# Patient Record
Sex: Female | Born: 1970 | Race: White | Hispanic: No | Smoking: Current every day smoker
Health system: Southern US, Community
[De-identification: ages and names within clinical notes are randomized; demographics above are authoritative.]

## PROBLEM LIST (undated history)

## (undated) DIAGNOSIS — J302 Other seasonal allergic rhinitis: Secondary | ICD-10-CM

## (undated) DIAGNOSIS — M542 Cervicalgia: Secondary | ICD-10-CM

## (undated) DIAGNOSIS — A6000 Herpesviral infection of urogenital system, unspecified: Secondary | ICD-10-CM

## (undated) DIAGNOSIS — I8390 Asymptomatic varicose veins of unspecified lower extremity: Secondary | ICD-10-CM

## (undated) DIAGNOSIS — F32A Depression, unspecified: Secondary | ICD-10-CM

## (undated) DIAGNOSIS — L719 Rosacea, unspecified: Secondary | ICD-10-CM

## (undated) DIAGNOSIS — Z22322 Carrier or suspected carrier of Methicillin resistant Staphylococcus aureus: Secondary | ICD-10-CM

## (undated) DIAGNOSIS — N920 Excessive and frequent menstruation with regular cycle: Secondary | ICD-10-CM

## (undated) DIAGNOSIS — J449 Chronic obstructive pulmonary disease, unspecified: Secondary | ICD-10-CM

## (undated) DIAGNOSIS — F329 Major depressive disorder, single episode, unspecified: Secondary | ICD-10-CM

## (undated) DIAGNOSIS — K449 Diaphragmatic hernia without obstruction or gangrene: Secondary | ICD-10-CM

## (undated) DIAGNOSIS — M79604 Pain in right leg: Secondary | ICD-10-CM

## (undated) DIAGNOSIS — A048 Other specified bacterial intestinal infections: Secondary | ICD-10-CM

## (undated) HISTORY — DX: Other specified bacterial intestinal infections: A04.8

## (undated) HISTORY — DX: Cervicalgia: M54.2

## (undated) HISTORY — DX: Excessive and frequent menstruation with regular cycle: N92.0

## (undated) HISTORY — DX: Pain in right leg: M79.604

## (undated) HISTORY — PX: HIP SURGERY: SHX245

## (undated) HISTORY — DX: Asymptomatic varicose veins of unspecified lower extremity: I83.90

## (undated) HISTORY — DX: Carrier or suspected carrier of methicillin resistant Staphylococcus aureus: Z22.322

## (undated) HISTORY — DX: Diaphragmatic hernia without obstruction or gangrene: K44.9

## (undated) HISTORY — DX: Other seasonal allergic rhinitis: J30.2

## (undated) HISTORY — DX: Herpesviral infection of urogenital system, unspecified: A60.00

## (undated) HISTORY — DX: Rosacea, unspecified: L71.9

---
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
## 1997-06-21 HISTORY — PX: ANKLE SURGERY: SHX546

## 2001-04-27 ENCOUNTER — Encounter: Payer: Self-pay | Admitting: Family Medicine

## 2001-04-27 ENCOUNTER — Ambulatory Visit (HOSPITAL_COMMUNITY): Admission: RE | Admit: 2001-04-27 | Discharge: 2001-04-27 | Payer: Self-pay | Admitting: Family Medicine

## 2002-04-27 ENCOUNTER — Ambulatory Visit (HOSPITAL_COMMUNITY): Admission: RE | Admit: 2002-04-27 | Discharge: 2002-04-27 | Payer: Self-pay | Admitting: Family Medicine

## 2002-04-27 ENCOUNTER — Encounter: Payer: Self-pay | Admitting: Family Medicine

## 2003-12-12 ENCOUNTER — Ambulatory Visit (HOSPITAL_COMMUNITY): Admission: RE | Admit: 2003-12-12 | Discharge: 2003-12-12 | Payer: Self-pay | Admitting: Family Medicine

## 2004-11-27 ENCOUNTER — Ambulatory Visit (HOSPITAL_COMMUNITY): Admission: RE | Admit: 2004-11-27 | Discharge: 2004-11-27 | Payer: Self-pay | Admitting: Family Medicine

## 2005-03-16 ENCOUNTER — Ambulatory Visit: Payer: Self-pay | Admitting: Internal Medicine

## 2007-05-01 ENCOUNTER — Other Ambulatory Visit: Admission: RE | Admit: 2007-05-01 | Discharge: 2007-05-01 | Payer: Self-pay | Admitting: Obstetrics and Gynecology

## 2007-05-24 ENCOUNTER — Ambulatory Visit (HOSPITAL_COMMUNITY): Admission: RE | Admit: 2007-05-24 | Discharge: 2007-05-24 | Payer: Self-pay | Admitting: Obstetrics & Gynecology

## 2007-09-15 ENCOUNTER — Ambulatory Visit (HOSPITAL_COMMUNITY): Admission: RE | Admit: 2007-09-15 | Discharge: 2007-09-15 | Payer: Self-pay | Admitting: Obstetrics and Gynecology

## 2008-01-12 ENCOUNTER — Ambulatory Visit (HOSPITAL_COMMUNITY): Admission: RE | Admit: 2008-01-12 | Discharge: 2008-01-12 | Payer: Self-pay | Admitting: Family Medicine

## 2008-01-14 ENCOUNTER — Emergency Department (HOSPITAL_COMMUNITY): Admission: EM | Admit: 2008-01-14 | Discharge: 2008-01-14 | Payer: Self-pay | Admitting: Emergency Medicine

## 2008-05-01 ENCOUNTER — Other Ambulatory Visit: Admission: RE | Admit: 2008-05-01 | Discharge: 2008-05-01 | Payer: Self-pay | Admitting: Obstetrics and Gynecology

## 2008-06-21 HISTORY — PX: FIBULA FRACTURE SURGERY: SHX947

## 2008-06-24 ENCOUNTER — Inpatient Hospital Stay (HOSPITAL_COMMUNITY): Admission: RE | Admit: 2008-06-24 | Discharge: 2008-06-25 | Payer: Self-pay | Admitting: Family Medicine

## 2009-06-21 HISTORY — PX: UTERINE FIBROID SURGERY: SHX826

## 2009-06-21 HISTORY — PX: UTERINE SEPTUM RESECTION: SHX5386

## 2009-08-08 ENCOUNTER — Ambulatory Visit (HOSPITAL_COMMUNITY): Admission: RE | Admit: 2009-08-08 | Discharge: 2009-08-08 | Payer: Self-pay | Admitting: Obstetrics & Gynecology

## 2009-11-24 ENCOUNTER — Ambulatory Visit (HOSPITAL_COMMUNITY): Admission: RE | Admit: 2009-11-24 | Discharge: 2009-11-24 | Payer: Self-pay | Admitting: Family Medicine

## 2010-07-11 ENCOUNTER — Encounter: Payer: Self-pay | Admitting: Family Medicine

## 2010-07-12 ENCOUNTER — Encounter: Payer: Self-pay | Admitting: Obstetrics & Gynecology

## 2010-07-20 ENCOUNTER — Other Ambulatory Visit (HOSPITAL_COMMUNITY): Payer: Self-pay | Admitting: Family Medicine

## 2010-07-20 DIAGNOSIS — R102 Pelvic and perineal pain: Secondary | ICD-10-CM

## 2010-07-20 DIAGNOSIS — N838 Other noninflammatory disorders of ovary, fallopian tube and broad ligament: Secondary | ICD-10-CM

## 2010-07-24 ENCOUNTER — Other Ambulatory Visit (HOSPITAL_COMMUNITY): Payer: Self-pay

## 2010-07-28 ENCOUNTER — Ambulatory Visit (HOSPITAL_COMMUNITY)
Admission: RE | Admit: 2010-07-28 | Discharge: 2010-07-28 | Disposition: A | Discharge status: Home / Self Care | Payer: Medicare Other | Source: Ambulatory Visit | Attending: Family Medicine | Admitting: Family Medicine

## 2010-07-28 DIAGNOSIS — N838 Other noninflammatory disorders of ovary, fallopian tube and broad ligament: Secondary | ICD-10-CM

## 2010-07-28 DIAGNOSIS — R1031 Right lower quadrant pain: Secondary | ICD-10-CM | POA: Insufficient documentation

## 2010-07-28 DIAGNOSIS — R9389 Abnormal findings on diagnostic imaging of other specified body structures: Secondary | ICD-10-CM | POA: Insufficient documentation

## 2010-07-28 DIAGNOSIS — R1032 Left lower quadrant pain: Secondary | ICD-10-CM | POA: Insufficient documentation

## 2010-07-28 DIAGNOSIS — R102 Pelvic and perineal pain unspecified side: Secondary | ICD-10-CM

## 2010-10-05 LAB — DIFFERENTIAL
Basophils Absolute: 0.1 10*3/uL (ref 0.0–0.1)
Basophils Relative: 1 % (ref 0–1)
Eosinophils Absolute: 0.2 10*3/uL (ref 0.0–0.7)
Monocytes Absolute: 0.9 10*3/uL (ref 0.1–1.0)
Monocytes Relative: 12 % (ref 3–12)
Neutro Abs: 4.7 10*3/uL (ref 1.7–7.7)

## 2010-10-05 LAB — CBC
HCT: 39 % (ref 36.0–46.0)
Hemoglobin: 13 g/dL (ref 12.0–15.0)
RBC: 4.05 MIL/uL (ref 3.87–5.11)

## 2010-10-05 LAB — BASIC METABOLIC PANEL
BUN: 10 mg/dL (ref 6–23)
CO2: 26 mEq/L (ref 19–32)
Chloride: 109 mEq/L (ref 96–112)
Creatinine, Ser: 0.71 mg/dL (ref 0.4–1.2)
GFR calc Af Amer: >60 mL/min (ref >60)
Potassium: 3.4 mEq/L — ABNORMAL LOW (ref 3.5–5.1)
Sodium: 137 mEq/L (ref 135–145)
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
## 2011-01-06 ENCOUNTER — Other Ambulatory Visit: Payer: Self-pay | Admitting: Family Medicine

## 2011-01-06 LAB — HEPATIC FUNCTION PANEL
ALT: 17 U/L (ref 7–35)
Albumin: 4.1
Alkaline Phosphatase: 55 U/L
Total Protein: 6.8 g/dL

## 2011-01-12 ENCOUNTER — Encounter (HOSPITAL_COMMUNITY): Payer: Medicare Other

## 2011-01-27 ENCOUNTER — Other Ambulatory Visit: Payer: Self-pay | Admitting: Cardiology

## 2011-01-28 ENCOUNTER — Encounter (HOSPITAL_COMMUNITY)
Admission: RE | Admit: 2011-01-28 | Discharge: 2011-01-28 | Disposition: A | Discharge status: Home / Self Care | Payer: Medicare Other | Source: Ambulatory Visit | Attending: Family Medicine | Admitting: Family Medicine

## 2011-01-28 DIAGNOSIS — R932 Abnormal findings on diagnostic imaging of liver and biliary tract: Secondary | ICD-10-CM | POA: Insufficient documentation

## 2011-01-28 DIAGNOSIS — R109 Unspecified abdominal pain: Secondary | ICD-10-CM | POA: Insufficient documentation

## 2011-01-28 MED ORDER — TECHNETIUM TC 99M MEBROFENIN IV KIT
5.0000 | PACK | Freq: Once | INTRAVENOUS | Status: AC | PRN
  Administered 2011-01-28: 5.1 via INTRAVENOUS

## 2011-03-19 LAB — PREGNANCY, URINE: Preg Test, Ur: NEGATIVE

## 2011-03-19 LAB — CBC
Hemoglobin: 13.2
MCV: 95.8
Platelets: 185
WBC: 12.3 — ABNORMAL HIGH

## 2011-03-19 LAB — URINALYSIS, ROUTINE W REFLEX MICROSCOPIC: Nitrite: NEGATIVE

## 2011-03-19 LAB — BASIC METABOLIC PANEL
Calcium: 9.5
Chloride: 101
Glucose, Bld: 98
Potassium: 3.3 — ABNORMAL LOW

## 2011-03-19 LAB — URINE MICROSCOPIC-ADD ON

## 2011-03-19 LAB — DIFFERENTIAL
Basophils Absolute: 0.1
Eosinophils Absolute: 0.1
Eosinophils Relative: 1
Neutrophils Relative %: 69

## 2011-04-02 NOTE — H&P (Signed)
NTS SOAP Note  Vital Signs:  Vitals as of: 02/09/2011: Systolic 114: Diastolic 74: Heart Rate 54: Temp 97.11F: Height 23ft 4in: Weight 137Lbs 0 Ounces: Pain Level 7: BMI 24  BMI : 23.52 kg/m2  Subjective: This 89 Years 59 Months old Female presents for of Abdominal pain. Patient states she's been having issues with abdominal pain for a long time. Patient can't quantify the exact amount of time but states at least a year. Pain is intermittent. It is epigastric and occasionally right upper quadrant. There is no additional radiation. She states hurts to lie on that area. No other exacerbating factors. She's not noted any changes with oral intake. She has not noted any changes with fatty greasy food intake. She states the pain as sharp. Other than that she has difficulties discussion describing the pain. Pain often resolves on its own. She does have associated nausea. Occasional nonbloody emesis. She does complain of diarrhea. No melena no hematochezia. She denies any fevers or chills. She denies any history of jaundice. She does complain of significant vaginal bleeding. This is been going on for the last month on a nearly daily basis. She states that she has discussed this with her primary care physician and she has seen a gynecologist several weeks ago. She's not certain what the plans are regarding the bleeding issue.  Review of Symptoms:  Constitutional: unremarkable Headaches, tired Head: unremarkable  Eyes:unremarkable  Nose/Mouth/Throat:unremarkable  Cardiovascular:unremarkable  Respiratory: unremarkable  As per history of present illness As per history of present illness Diffuse arthralgias Skin: unremarkable  Breast: unremarkable  Hematolgic/Lymphatic: unremarkable  Allergic/Immunologic:unremarkable     Past Medical History:Obtained  Past Medical History  Pregnancy Gravida: 2 Pregnancy Para: 0 Surgical History: none Medical Problems: abnormal vaginal bleeding Allergies: no  known drug allergies   Social History:Obtained   Social History  Preferred Language: English ([place]) Race: White Ethnicity: Not Hispanic / Latino Age: 40 Years 4 Months Marital Status: L Alcohol: No Recreational drug(s): No   Smoking Status: Current every day smoker reviewed on 02/10/2011 Started Date: 06/22/1983 Packs per day: 1.00   Family History: Obtained   Family History  Is there a family history of:non-contributory   Medication Allergies:   Allergies Insert Code:   Objective Information:  General: Well appearing, well nourished in no distress. Thin, slightly anxious and slightly disheveled.  Skin:no rash or prominent lesions  Head: Atraumatic; no masses; no abnormalities  Eyes: conjunctiva clear, EOM intact, PERRL  Mouth: Mucous membranes moist, no mucosal lesions.  Neck: Supple without lymphadenopathy.  Heart: RRR, no murmur  Lungs:CTA bilaterally, no wheezes, rhonchi, rales. Breathing unlabored.  Abdomen: Soft, NT/ND, no HSM, no masses.  Extremities: No deformities, clubbing, cyanosis, or edema.    Right upper quadrant ultrasound: Positive biliary stones. No biliary tree dilatation. No gallbladder wall thickening. no pericholecystic fluid. Assessment:   Diagnosis & Procedure: DiagnosisCode: 575.8, ProcedureCode: 16109,   Orders: Preop     Plan: Scheduled for laparoscopic cholecystectomy on 04/07/2011.   Patient Education: Alternative treatments to surgery were discussed with patient (and family).Risks and benefits of procedure were fully explained to the patient (and family) who gave informed consent. Patient/family questions were addressed.  Follow-up: Pending surgery

## 2011-04-05 ENCOUNTER — Encounter (HOSPITAL_COMMUNITY): Payer: Self-pay

## 2011-04-05 ENCOUNTER — Encounter (HOSPITAL_COMMUNITY)
Admission: RE | Admit: 2011-04-05 | Discharge: 2011-04-05 | Disposition: A | Discharge status: Home / Self Care | Payer: Medicare Other | Source: Ambulatory Visit | Attending: General Surgery | Admitting: General Surgery

## 2011-04-05 HISTORY — DX: Chronic obstructive pulmonary disease, unspecified: J44.9

## 2011-04-05 LAB — CBC
HCT: 39.8 % (ref 36.0–46.0)
MCHC: 34.9 g/dL (ref 30.0–36.0)
MCV: 95.4 fL (ref 78.0–100.0)
Platelets: 195 10*3/uL (ref 150–400)
RDW: 13.9 % (ref 11.5–15.5)

## 2011-04-05 LAB — BASIC METABOLIC PANEL
BUN: 15 mg/dL (ref 6–23)
CO2: 25 mEq/L (ref 19–32)
Calcium: 9.3 mg/dL (ref 8.4–10.5)
Chloride: 105 mEq/L (ref 96–112)
Creatinine, Ser: 0.73 mg/dL (ref 0.50–1.10)
GFR calc Af Amer: >90 mL/min (ref >90)

## 2011-04-05 LAB — DIFFERENTIAL
Basophils Absolute: 0 10*3/uL (ref 0.0–0.1)
Basophils Relative: 0 % (ref 0–1)
Eosinophils Relative: 1 % (ref 0–5)
Monocytes Absolute: 1 10*3/uL (ref 0.1–1.0)

## 2011-04-05 LAB — SURGICAL PCR SCREEN
MRSA, PCR: NEGATIVE
Staphylococcus aureus: NEGATIVE

## 2011-04-05 LAB — HEPATIC FUNCTION PANEL
Bilirubin, Direct: 0.2 mg/dL (ref 0.0–0.3)
Indirect Bilirubin: 0.7 mg/dL (ref 0.3–0.9)
Total Bilirubin: 0.9 mg/dL (ref 0.3–1.2)

## 2011-04-05 NOTE — Patient Instructions (Addendum)
18 Mackenzie Williams  04/05/2011   Your procedure is scheduled on:  04/07/11  [facility] to [facility] at [place] [facility].  Call this number if you have problems the morning of surgery: [phone]   Remember:   Do not eat food:After Midnight.  Do not drink clear liquids: After Midnight.  Take these medicines the morning of surgery with A SIP OF WATER: prilosec, nexium   Do not wear jewelry, make-up or nail polish.  Do not wear lotions, powders, or perfumes. You may wear deodorant.  Do not shave 48 hours prior to surgery.  Do not bring valuables to the hospital.  Contacts, dentures or bridgework may not be worn into surgery.  Leave suitcase in the car. After surgery it may be brought to your room.  For patients admitted to the hospital, checkout time is 11:00 AM the day of discharge.   Patients discharged the day of surgery will not be allowed to drive home.  Name and phone number of your driver: family  Special Instructions: CHG Shower Use Special Wash: 1/2 bottle night before surgery and 1/2 bottle morning of surgery.   Please read over the following fact sheets that you were given: Pain Booklet, MRSA Information, Surgical Site Infection Prevention, Anesthesia Post-op Instructions and Care and Recovery After Surgery   PATIENT INSTRUCTIONS POST-ANESTHESIA  IMMEDIATELY FOLLOWING SURGERY:  Do not drive or operate machinery for the first twenty four hours after surgery.  Do not make any important decisions for twenty four hours after surgery or while taking narcotic pain medications or sedatives.  If you develop intractable nausea and vomiting or a severe headache please notify your doctor immediately.  FOLLOW-UP:  Please make an appointment with your surgeon as instructed. You do not need to follow up with anesthesia unless specifically instructed to do so.  WOUND CARE INSTRUCTIONS (if applicable):  Keep a dry clean dressing on the anesthesia/puncture wound site if there is drainage.  Once the wound  has quit draining you may leave it open to air.  Generally you should leave the bandage intact for twenty four hours unless there is drainage.  If the epidural site drains for more than 36-48 hours please call the anesthesia department.  QUESTIONS?:  Please feel free to call your physician or the hospital operator if you have any questions, and they will be happy to assist you.     [facility] Anesthesia Department [address] [place] [phone]

## 2011-04-07 ENCOUNTER — Encounter (HOSPITAL_COMMUNITY): Payer: Self-pay | Admitting: Anesthesiology

## 2011-04-07 ENCOUNTER — Encounter (HOSPITAL_COMMUNITY)
Admission: RE | Disposition: A | Discharge status: Home / Self Care | Payer: Self-pay | Source: Ambulatory Visit | Attending: General Surgery

## 2011-04-07 ENCOUNTER — Ambulatory Visit (HOSPITAL_COMMUNITY)
Admission: RE | Admit: 2011-04-07 | Discharge: 2011-04-07 | Disposition: A | Discharge status: Home / Self Care | Payer: Medicare Other | Source: Ambulatory Visit | Attending: General Surgery | Admitting: General Surgery

## 2011-04-07 ENCOUNTER — Encounter (HOSPITAL_COMMUNITY): Payer: Self-pay

## 2011-04-07 ENCOUNTER — Other Ambulatory Visit: Payer: Self-pay | Admitting: General Surgery

## 2011-04-07 ENCOUNTER — Ambulatory Visit (HOSPITAL_COMMUNITY): Payer: Medicare Other | Admitting: Anesthesiology

## 2011-04-07 DIAGNOSIS — Z01812 Encounter for preprocedural laboratory examination: Secondary | ICD-10-CM | POA: Insufficient documentation

## 2011-04-07 DIAGNOSIS — K8010 Calculus of gallbladder with chronic cholecystitis without obstruction: Secondary | ICD-10-CM | POA: Insufficient documentation

## 2011-04-07 DIAGNOSIS — J449 Chronic obstructive pulmonary disease, unspecified: Secondary | ICD-10-CM | POA: Insufficient documentation

## 2011-04-07 DIAGNOSIS — J4489 Other specified chronic obstructive pulmonary disease: Secondary | ICD-10-CM | POA: Insufficient documentation

## 2011-04-07 HISTORY — PX: CHOLECYSTECTOMY: SHX55

## 2011-04-07 SURGERY — LAPAROSCOPIC CHOLECYSTECTOMY
Anesthesia: General | Site: Abdomen | Wound class: Clean Contaminated

## 2011-04-07 MED ORDER — GLYCOPYRROLATE 0.2 MG/ML IJ SOLN
0.2000 mg | Freq: Once | INTRAMUSCULAR | Status: AC
  Administered 2011-04-07: 0.2 mg via INTRAVENOUS

## 2011-04-07 MED ORDER — ENOXAPARIN SODIUM 40 MG/0.4ML ~~LOC~~ SOLN
40.0000 mg | Freq: Once | SUBCUTANEOUS | Status: AC
  Administered 2011-04-07: 40 mg via SUBCUTANEOUS

## 2011-04-07 MED ORDER — SUCCINYLCHOLINE CHLORIDE 20 MG/ML IJ SOLN
INTRAMUSCULAR | Status: AC
  Filled 2011-04-07: qty 1

## 2011-04-07 MED ORDER — FENTANYL CITRATE 0.05 MG/ML IJ SOLN
INTRAMUSCULAR | Status: DC | PRN
  Administered 2011-04-07: 100 ug via INTRAVENOUS
  Administered 2011-04-07: 50 ug via INTRAVENOUS
  Administered 2011-04-07: 100 ug via INTRAVENOUS

## 2011-04-07 MED ORDER — ONDANSETRON HCL 4 MG/2ML IJ SOLN: 4.0000 mg | Freq: Once | INTRAMUSCULAR | Status: DC | PRN

## 2011-04-07 MED ORDER — PROPOFOL 10 MG/ML IV EMUL
INTRAVENOUS | Status: DC | PRN
  Administered 2011-04-07: 150 mg via INTRAVENOUS

## 2011-04-07 MED ORDER — GLYCOPYRROLATE 0.2 MG/ML IJ SOLN
INTRAMUSCULAR | Status: AC
  Administered 2011-04-07: 0.2 mg via INTRAVENOUS
  Filled 2011-04-07: qty 1

## 2011-04-07 MED ORDER — CEFAZOLIN SODIUM 1-5 GM-% IV SOLN
INTRAVENOUS | Status: AC
  Filled 2011-04-07: qty 50

## 2011-04-07 MED ORDER — BUPIVACAINE HCL (PF) 0.5 % IJ SOLN
INTRAMUSCULAR | Status: AC
  Filled 2011-04-07: qty 30

## 2011-04-07 MED ORDER — GLYCOPYRROLATE 0.2 MG/ML IJ SOLN
INTRAMUSCULAR | Status: AC
  Filled 2011-04-07: qty 2

## 2011-04-07 MED ORDER — BUPIVACAINE HCL 0.5 % IJ SOLN
INTRAMUSCULAR | Status: DC | PRN
  Administered 2011-04-07: 10 mL

## 2011-04-07 MED ORDER — KETOROLAC TROMETHAMINE 30 MG/ML IJ SOLN
INTRAMUSCULAR | Status: AC
  Filled 2011-04-07: qty 1

## 2011-04-07 MED ORDER — ROCURONIUM BROMIDE 50 MG/5ML IV SOLN
INTRAVENOUS | Status: AC
  Filled 2011-04-07: qty 1

## 2011-04-07 MED ORDER — KETOROLAC TROMETHAMINE 30 MG/ML IJ SOLN
30.0000 mg | Freq: Once | INTRAMUSCULAR | Status: AC
  Administered 2011-04-07: 30 mg via INTRAVENOUS

## 2011-04-07 MED ORDER — FENTANYL CITRATE 0.05 MG/ML IJ SOLN
25.0000 ug | INTRAMUSCULAR | Status: DC | PRN
  Administered 2011-04-07 (×3): 50 ug via INTRAVENOUS

## 2011-04-07 MED ORDER — CEFAZOLIN SODIUM 1-5 GM-% IV SOLN
INTRAVENOUS | Status: DC | PRN
  Administered 2011-04-07: 1 g via INTRAVENOUS

## 2011-04-07 MED ORDER — NEOSTIGMINE METHYLSULFATE 1 MG/ML IJ SOLN
INTRAMUSCULAR | Status: DC | PRN
  Administered 2011-04-07: 4 mg via INTRAVENOUS

## 2011-04-07 MED ORDER — GLYCOPYRROLATE 0.2 MG/ML IJ SOLN
INTRAMUSCULAR | Status: DC | PRN
  Administered 2011-04-07: .6 mg via INTRAVENOUS

## 2011-04-07 MED ORDER — NEOSTIGMINE METHYLSULFATE 1 MG/ML IJ SOLN
INTRAMUSCULAR | Status: AC
  Filled 2011-04-07: qty 10

## 2011-04-07 MED ORDER — ONDANSETRON HCL 4 MG/2ML IJ SOLN
INTRAMUSCULAR | Status: AC
  Administered 2011-04-07: 4 mg via INTRAVENOUS
  Filled 2011-04-07: qty 2

## 2011-04-07 MED ORDER — FENTANYL CITRATE 0.05 MG/ML IJ SOLN
INTRAMUSCULAR | Status: AC
  Filled 2011-04-07: qty 2

## 2011-04-07 MED ORDER — LIDOCAINE HCL (PF) 1 % IJ SOLN
INTRAMUSCULAR | Status: AC
  Filled 2011-04-07: qty 5

## 2011-04-07 MED ORDER — HEMOSTATIC AGENTS (NO CHARGE) OPTIME
TOPICAL | Status: DC | PRN
  Administered 2011-04-07: 1 via TOPICAL

## 2011-04-07 MED ORDER — OXYCODONE-ACETAMINOPHEN 7.5-325 MG PO TABS: 1.0000 | ORAL_TABLET | ORAL | Status: DC | PRN

## 2011-04-07 MED ORDER — SUCCINYLCHOLINE CHLORIDE 20 MG/ML IJ SOLN
INTRAMUSCULAR | Status: DC | PRN
  Administered 2011-04-07: 100 mg via INTRAVENOUS

## 2011-04-07 MED ORDER — ROCURONIUM BROMIDE 100 MG/10ML IV SOLN
INTRAVENOUS | Status: DC | PRN
  Administered 2011-04-07: 20 mg via INTRAVENOUS

## 2011-04-07 MED ORDER — SODIUM CHLORIDE 0.9 % IR SOLN
Status: DC | PRN
  Administered 2011-04-07: 1000 mL

## 2011-04-07 MED ORDER — PROPOFOL 10 MG/ML IV EMUL
INTRAVENOUS | Status: AC
  Filled 2011-04-07: qty 20

## 2011-04-07 MED ORDER — LACTATED RINGERS IV SOLN
INTRAVENOUS | Status: DC
  Administered 2011-04-07: 07:00:00 via INTRAVENOUS

## 2011-04-07 MED ORDER — LACTATED RINGERS IV SOLN
INTRAVENOUS | Status: DC | PRN
  Administered 2011-04-07: 07:00:00 via INTRAVENOUS

## 2011-04-07 MED ORDER — ONDANSETRON HCL 4 MG/2ML IJ SOLN
4.0000 mg | Freq: Once | INTRAMUSCULAR | Status: AC
  Administered 2011-04-07: 4 mg via INTRAVENOUS

## 2011-04-07 MED ORDER — FENTANYL CITRATE 0.05 MG/ML IJ SOLN
INTRAMUSCULAR | Status: AC
  Administered 2011-04-07: 50 ug via INTRAVENOUS
  Filled 2011-04-07: qty 2

## 2011-04-07 MED ORDER — MIDAZOLAM HCL 2 MG/2ML IJ SOLN
INTRAMUSCULAR | Status: AC
  Administered 2011-04-07: 2 mg via INTRAVENOUS
  Filled 2011-04-07: qty 2

## 2011-04-07 MED ORDER — CEFAZOLIN SODIUM 1-5 GM-% IV SOLN: 1.0000 g | INTRAVENOUS | Status: DC

## 2011-04-07 MED ORDER — MIDAZOLAM HCL 2 MG/2ML IJ SOLN
1.0000 mg | INTRAMUSCULAR | Status: DC | PRN
  Administered 2011-04-07: 2 mg via INTRAVENOUS

## 2011-04-07 MED ORDER — FENTANYL CITRATE 0.05 MG/ML IJ SOLN
INTRAMUSCULAR | Status: AC
  Administered 2011-04-07: 50 ug via INTRAVENOUS
  Filled 2011-04-07: qty 5

## 2011-04-07 MED ORDER — ENOXAPARIN SODIUM 40 MG/0.4ML ~~LOC~~ SOLN
SUBCUTANEOUS | Status: AC
  Administered 2011-04-07: 40 mg via SUBCUTANEOUS
  Filled 2011-04-07: qty 0.4

## 2011-04-07 SURGICAL SUPPLY — 36 items
APPLIER CLIP ROT 10 11.4 M/L (STAPLE) ×2
APR CLP MED LRG 11.4X10 (STAPLE) ×1
BAG HAMPER (MISCELLANEOUS) ×2 IMPLANT
BAG SPEC RTRVL LRG 6X4 10 (ENDOMECHANICALS) ×1
CLIP APPLIE ROT 10 11.4 M/L (STAPLE) ×1 IMPLANT
CLOTH BEACON ORANGE TIMEOUT ST (SAFETY) ×2 IMPLANT
COVER LIGHT HANDLE STERIS (MISCELLANEOUS) ×4 IMPLANT
DECANTER SPIKE VIAL GLASS SM (MISCELLANEOUS) ×2 IMPLANT
DURAPREP 26ML APPLICATOR (WOUND CARE) ×2 IMPLANT
ELECT REM PT RETURN 9FT ADLT (ELECTROSURGICAL) ×2
ELECTRODE REM PT RTRN 9FT ADLT (ELECTROSURGICAL) ×1 IMPLANT
FILTER SMOKE EVAC LAPAROSHD (FILTER) ×2 IMPLANT
FORMALIN 10 PREFIL 120ML (MISCELLANEOUS) ×2 IMPLANT
GLOVE BIO SURGEON STRL SZ7.5 (GLOVE) ×2 IMPLANT
GLOVE ECLIPSE 6.5 STRL STRAW (GLOVE) ×1 IMPLANT
GLOVE ECLIPSE 7.0 STRL STRAW (GLOVE) ×1 IMPLANT
GLOVE EXAM NITRILE MD LF STRL (GLOVE) ×1 IMPLANT
GLOVE INDICATOR 7.0 STRL GRN (GLOVE) ×1 IMPLANT
GLOVE INDICATOR 7.5 STRL GRN (GLOVE) ×1 IMPLANT
GOWN BRE IMP SLV AUR XL STRL (GOWN DISPOSABLE) ×6 IMPLANT
HEMOSTAT SNOW SURGICEL 2X4 (HEMOSTASIS) ×2 IMPLANT
INST SET LAPROSCOPIC AP (KITS) ×2 IMPLANT
KIT ROOM TURNOVER APOR (KITS) ×2 IMPLANT
KIT TROCAR LAP CHOLE (TROCAR) ×2 IMPLANT
MANIFOLD NEPTUNE II (INSTRUMENTS) ×2 IMPLANT
NS IRRIG 1000ML POUR BTL (IV SOLUTION) ×2 IMPLANT
PACK LAP CHOLE LZT030E (CUSTOM PROCEDURE TRAY) ×2 IMPLANT
PAD ARMBOARD 7.5X6 YLW CONV (MISCELLANEOUS) ×2 IMPLANT
POUCH SPECIMEN RETRIEVAL 10MM (ENDOMECHANICALS) ×2 IMPLANT
SET BASIN LINEN APH (SET/KITS/TRAYS/PACK) ×2 IMPLANT
SPONGE GAUZE 2X2 8PLY STRL LF (GAUZE/BANDAGES/DRESSINGS) ×5 IMPLANT
STAPLER VISISTAT (STAPLE) ×2 IMPLANT
SUT VICRYL 0 UR6 27IN ABS (SUTURE) ×2 IMPLANT
TAPE CLOTH SURG 4X10 WHT LF (GAUZE/BANDAGES/DRESSINGS) ×1 IMPLANT
WARMER LAPAROSCOPE (MISCELLANEOUS) ×2 IMPLANT
YANKAUER SUCT 12FT TUBE ARGYLE (SUCTIONS) ×2 IMPLANT

## 2011-04-07 NOTE — Anesthesia Preprocedure Evaluation (Addendum)
Anesthesia Evaluation  Name, MR# and DOB Patient awake  General Assessment Comment  Reviewed: Allergy & Precautions, H&P , NPO status , Patient's Chart, lab work & pertinent test results  History of Anesthesia Complications Negative for: history of anesthetic complications  Airway Mallampati: II  Neck ROM: Full    Dental  (+) Teeth Intact   Pulmonary COPDCurrent Smoker    Pulmonary exam normal       Cardiovascular Regular Normal    Neuro/Psych    GI/Hepatic GERD Medicated and Poorly Controlled  Endo/Other    Renal/GU      Musculoskeletal   Abdominal   Peds  Hematology   Anesthesia Other Findings   Reproductive/Obstetrics                           Anesthesia Physical Anesthesia Plan  ASA: II  Anesthesia Plan: General   Post-op Pain Management:    Induction: Intravenous, Rapid sequence and Cricoid pressure planned  Airway Management Planned: Oral ETT  Additional Equipment:   Intra-op Plan:   Post-operative Plan: Extubation in OR  Informed Consent: I have reviewed the patients History and Physical, chart, labs and discussed the procedure including the risks, benefits and alternatives for the proposed anesthesia with the patient or authorized representative who has indicated his/her understanding and acceptance.     Plan Discussed with:   Anesthesia Plan Comments:         Anesthesia Quick Evaluation

## 2011-04-07 NOTE — Transfer of Care (Signed)
Immediate Anesthesia Transfer of Care Note  Patient: Mackenzie Williams  Procedure(s) Performed:  LAPAROSCOPIC CHOLECYSTECTOMY  Patient Location: PACU  Anesthesia Type: General  Level of Consciousness: awake, alert , oriented and patient cooperative  Airway & Oxygen Therapy: Patient Spontanous Breathing and Patient connected to face mask oxygen  Post-op Assessment: Report given to PACU RN and Post -op Vital signs reviewed and stable  Post vital signs: stable  Complications: No apparent anesthesia complications

## 2011-04-07 NOTE — Anesthesia Postprocedure Evaluation (Signed)
  Anesthesia Post-op Note  Patient: Mackenzie Williams  Procedure(s) Performed:  LAPAROSCOPIC CHOLECYSTECTOMY  Patient Location: PACU  Anesthesia Type: General  Level of Consciousness: awake, alert , oriented and patient cooperative  Airway and Oxygen Therapy: Patient Spontanous Breathing and Patient connected to face mask oxygen  Post-op Pain: mild  Post-op Assessment: Post-op Vital signs reviewed, Patient's Cardiovascular Status Stable and Respiratory Function Stable  Post-op Vital Signs: stable  Complications: No apparent anesthesia complications

## 2011-04-07 NOTE — Op Note (Signed)
Patient:  Mackenzie Williams  DOB:  1970/12/19  MRN:  [id number]   Preop Diagnosis:  Cholecystitis, cholelithiasis  Postop Diagnosis:  Same  Procedure:  Laparoscopic cholecystectomy  Surgeon:  Franky Macho, M.D.  Anes:  General endotracheal  Indications:  Patient is a 40 year old white female presents with biliary colic secondary to cholelithiasis. The risks and benefits of the procedure including bleeding, infection, hepatobiliary injury, the possibly of an open procedure were fully explained to the patient, gave informed consent.  Procedure note:  Patient is placed the supine position. After induction of general endotracheal anesthesia, the abdomen was prepped and draped using usual sterile technique with DuraPrep. Surgical site confirmation was performed.  An infraumbilical incision was made down to the fascia. A Veress needle was introduced into the abdominal cavity and confirmation of placement was done using the saline drop test. The abdomen was then insufflated to 16 mm mercury pressure. An 11 mm trocar was introduced into the abdominal cavity under direct visualization without difficulty. Patient is placed in reverse Trendelenburg position additional 11 mm trocar was placed the epigastric region 5 mm trochars were placed the right upper quadrant right flank regions. Liver was inspected and noted within normal limits. The gallbladder was retracted superior laterally. Dissection was begun around the infundibulum of the gallbladder. The cystic duct was first identified. Junction to the infundibulum flow identified. Endoclips placed proximally distally on the cystic duct and cystic duct was divided. This is likewise done cystic artery. The gallbladder was then freed away from the gallbladder fossa using Bovie electrocautery. The gallbladder delivered through the epigastric trocar site using an Endo Catch bag. The gallbladder fossa was inspected no abnormal bleeding or bile leakage was noted.  Surgicel is placed the gallbladder fossa. All fluid and air were then evacuated from the abdominal cavity prior to removal of the trochars.  All wounds were gave normal saline. All wounds were injected with 0.5% Sensorcaine. The infraumbilical fascia3 as well as epigastric fascia were reapproximated using 0 Vicryl interrupted sutures. All skin incisions were closed using staples. Betadine ointment after dressings were applied.  All tape and needle counts were correct at the end of the procedure. Patient was extubated in the operating room and went back to PACU in stable condition.  Complications:  None  EBL:  Minimal  Specimen:  Gallbladder

## 2011-04-07 NOTE — Anesthesia Procedure Notes (Signed)
Procedure Name: Intubation Date/Time: 04/07/2011 7:39 AM Performed by: Carolyne Littles, AMY Pre-anesthesia Checklist: Patient identified, Patient being monitored, Emergency Drugs available, Timeout performed and Suction available Patient Re-evaluated:Patient Re-evaluated prior to inductionPreoxygenation: Pre-oxygenation with 100% oxygen Intubation Type: IV induction Laryngoscope Size: Miller and 3 Grade View: Grade I Tube type: Oral Tube size: 7.0 mm Number of attempts: 1 Placement Confirmation: ETT inserted through vocal cords under direct vision,  positive ETCO2 and breath sounds checked- equal and bilateral Secured at: 22 cm Tube secured with: Tape Dental Injury: Teeth and Oropharynx as per pre-operative assessment

## 2011-04-07 NOTE — Interval H&P Note (Signed)
History and Physical Interval Note:   04/07/2011   7:28 AM   Mackenzie Williams  has presented today for surgery, with the diagnosis of Calculus of gallbladder with other cholecystitis, without mention of obstruction  The various methods of treatment have been discussed with the patient and family. After consideration of risks, benefits and other options for treatment, the patient has consented to  Procedure(s): LAPAROSCOPIC CHOLECYSTECTOMY as a surgical intervention .  I have reviewed the patients' chart and labs.  Questions were answered to the patient's satisfaction.     Dalia Heading  MD

## 2011-04-15 ENCOUNTER — Encounter (HOSPITAL_COMMUNITY): Payer: Self-pay | Admitting: General Surgery

## 2011-09-29 ENCOUNTER — Other Ambulatory Visit: Payer: Self-pay | Admitting: Family Medicine

## 2011-09-29 DIAGNOSIS — M8580 Other specified disorders of bone density and structure, unspecified site: Secondary | ICD-10-CM

## 2011-10-06 ENCOUNTER — Other Ambulatory Visit (HOSPITAL_COMMUNITY): Payer: Medicare Other

## 2011-11-04 ENCOUNTER — Other Ambulatory Visit: Payer: Self-pay | Admitting: Family Medicine

## 2011-11-04 ENCOUNTER — Ambulatory Visit (HOSPITAL_COMMUNITY)
Admission: RE | Admit: 2011-11-04 | Discharge: 2011-11-04 | Disposition: A | Discharge status: Home / Self Care | Payer: Medicare Other | Source: Ambulatory Visit | Attending: Family Medicine | Admitting: Family Medicine

## 2011-11-04 DIAGNOSIS — R05 Cough: Secondary | ICD-10-CM

## 2011-11-04 DIAGNOSIS — R911 Solitary pulmonary nodule: Secondary | ICD-10-CM | POA: Insufficient documentation

## 2011-11-04 DIAGNOSIS — M4000 Postural kyphosis, site unspecified: Secondary | ICD-10-CM | POA: Insufficient documentation

## 2011-11-04 DIAGNOSIS — R059 Cough, unspecified: Secondary | ICD-10-CM | POA: Insufficient documentation

## 2011-11-04 DIAGNOSIS — R0989 Other specified symptoms and signs involving the circulatory and respiratory systems: Secondary | ICD-10-CM | POA: Insufficient documentation

## 2011-11-04 LAB — CBC WITH DIFFERENTIAL/PLATELET
HCT: 42 %
platelet count: 211

## 2011-11-04 LAB — HEPATIC FUNCTION PANEL
Amylase: 47 units/L (ref 25–110)
Bilirubin, Direct: 0.5 mg/dL — AB (ref 0.01–0.4)
Bilirubin, Indirect: 2.4
Total Bilirubin: 2.9 mg/dL

## 2011-11-08 ENCOUNTER — Other Ambulatory Visit: Payer: Self-pay

## 2011-11-08 DIAGNOSIS — R05 Cough: Secondary | ICD-10-CM

## 2011-11-10 ENCOUNTER — Encounter (HOSPITAL_COMMUNITY): Payer: Medicare Other

## 2011-11-11 ENCOUNTER — Other Ambulatory Visit: Payer: Self-pay

## 2011-11-11 DIAGNOSIS — I83893 Varicose veins of bilateral lower extremities with other complications: Secondary | ICD-10-CM

## 2011-11-11 DIAGNOSIS — I8310 Varicose veins of unspecified lower extremity with inflammation: Secondary | ICD-10-CM

## 2011-11-19 ENCOUNTER — Ambulatory Visit (HOSPITAL_COMMUNITY)
Admission: RE | Admit: 2011-11-19 | Discharge: 2011-11-19 | Disposition: A | Discharge status: Home / Self Care | Payer: Medicare Other | Source: Ambulatory Visit | Attending: Family Medicine | Admitting: Family Medicine

## 2011-11-19 DIAGNOSIS — R0609 Other forms of dyspnea: Secondary | ICD-10-CM | POA: Insufficient documentation

## 2011-11-19 DIAGNOSIS — R059 Cough, unspecified: Secondary | ICD-10-CM | POA: Insufficient documentation

## 2011-11-19 DIAGNOSIS — R05 Cough: Secondary | ICD-10-CM | POA: Insufficient documentation

## 2011-11-19 DIAGNOSIS — R0989 Other specified symptoms and signs involving the circulatory and respiratory systems: Secondary | ICD-10-CM | POA: Insufficient documentation

## 2011-11-20 DIAGNOSIS — A048 Other specified bacterial intestinal infections: Secondary | ICD-10-CM

## 2011-11-20 HISTORY — DX: Other specified bacterial intestinal infections: A04.8

## 2011-11-22 ENCOUNTER — Ambulatory Visit (INDEPENDENT_AMBULATORY_CARE_PROVIDER_SITE_OTHER): Payer: Medicare Other | Admitting: Urgent Care

## 2011-11-22 ENCOUNTER — Other Ambulatory Visit: Payer: Self-pay | Admitting: Gastroenterology

## 2011-11-22 ENCOUNTER — Encounter: Payer: Self-pay | Admitting: Urgent Care

## 2011-11-22 ENCOUNTER — Telehealth: Payer: Self-pay

## 2011-11-22 VITALS — BP 114/65 | HR 71 | Temp 97.9°F | Ht 64.0 in | Wt 131.0 lb

## 2011-11-22 DIAGNOSIS — A048 Other specified bacterial intestinal infections: Secondary | ICD-10-CM | POA: Insufficient documentation

## 2011-11-22 DIAGNOSIS — R109 Unspecified abdominal pain: Secondary | ICD-10-CM

## 2011-11-22 DIAGNOSIS — R1010 Upper abdominal pain, unspecified: Secondary | ICD-10-CM

## 2011-11-22 DIAGNOSIS — R110 Nausea: Secondary | ICD-10-CM | POA: Insufficient documentation

## 2011-11-22 MED ORDER — AMOXICILL-CLARITHRO-LANSOPRAZ PO MISC: Freq: Two times a day (BID) | ORAL | Status: DC

## 2011-11-22 NOTE — Telephone Encounter (Signed)
Pt reminded to get u preg prior to starting antibiotics. Agrees w/ plan.

## 2011-11-22 NOTE — Patient Instructions (Addendum)
EGD with Dr. Jena Gauss in the near future I have sent prescription for Prevpac to [facility] in [place]. Continue omeprazole 20 mg daily until you start Prevpac. Hold omeprazole while taking Prevpac and resume once you have finished the pack. 1-800-quit now for help quitting smoking No aspirin, ibuprofen, Motrin, over-the-counter anti-inflammatories or headache powders! Go get your urine pregnancy test today.  Urine drug screen at least 3 days prior to endoscopy.

## 2011-11-22 NOTE — Assessment & Plan Note (Signed)
see upper abdominal pain 

## 2011-11-22 NOTE — Telephone Encounter (Signed)
Received fax from [facility] pharmacy- prevpac not on pts formulary. Called in generics: amox 1gr bid x 14days, biaxin 500mg  bid x 14days, lansoprazole 30mg  bid x 14 days. Ok per [employer]. Asked pharmacy to call or fax if there is still a problem with the insurance.

## 2011-11-22 NOTE — Progress Notes (Signed)
Referring Provider: Babs Sciara, MD Primary Care Physician:  Lilyan Punt, MD, MD Primary Gastroenterologist:  Dr. Jena Gauss  Chief Complaint  Patient presents with  . H pylori    HPI:  Mackenzie Williams is a 41 y.o. female here as a referral from Dr. Gerda Diss for h pylori.  She was found to have a positive H. pylori IgA antibody.She has had upper abdominal pain for 1-2 years now.  RUQ worse than LUQ pain.  "Burning pain-terrible pains"  Constant 5/10.  Not associated w/ eating or movement.  C/o chronic nausea intermittent.  Denies vomiting.  C/o heartburn & indigestion several days per week on omeprazole 20mg  2-5 days per week.  Sometimes help.  Denies dysphagia or odynophagia.  She has a constant tickle in her throat.  C/o diarrhea or constipation.  C/o dark stools.  No rectal bleeding.  She has been having "Bad periods" with heavy bleeding.  Feels like "womb pain" w/ "bad PMS".  Weight loss 10# in last year unintentional. She has been taking Motrin 4 at a time, 4-6 times per day,  last dose 2 weeks ago after she was told to stop.    CBC was normal on 11/04/11 she also had an indirect hyperbilirubinemia. Total bilirubin is 2.9 direct 0.5 and indirect 2.4, but otherwise her LFTs were normal. Amylase 47. She was recently treated with Z-Pak and has completing Cefzil. Past Medical History  Diagnosis Date  . COPD (chronic obstructive pulmonary disease)   . Asthma   . Genital herpes   . Neck pain   . MRSA carrier   . Menorrhagia     [facility]   Past Surgical History  Procedure Date  . Uterine septum resection 2011  . Uterine fibroid surgery 2011  . Fibula fracture surgery 2010  . Ankle surgery 1999  . Cholecystectomy 04/07/2011    Dr Matilde Haymaker dyskinesia   Current Outpatient Prescriptions  Medication Sig Dispense Refill  . cefPROZIL (CEFZIL) 500 MG tablet Take 500 mg by mouth 2 (two) times daily.      . fluconazole (DIFLUCAN) 150 MG tablet Take 150 mg by mouth daily as needed. yeast        . omeprazole (PRILOSEC) 20 MG capsule Take 20 mg by mouth daily. Patient states she does not take everyday due to side effects       . oxyCODONE-acetaminophen (PERCOCET) 7.5-325 MG per tablet Take 1-2 tablets by mouth every 4 (four) hours as needed for pain.  40 tablet  0  . polyvinyl alcohol (LIQUIFILM TEARS) 1.4 % ophthalmic solution Place 1 drop into both eyes daily as needed. Dry eyes (OTC) medication       . valACYclovir (VALTREX) 500 MG tablet Take 500 mg by mouth daily.         Allergies as of 11/22/2011  . (No Known Allergies)   Family History  Problem Relation Age of Onset  . Anesthesia problems Neg Hx   . Hypotension Neg Hx   . Malignant hyperthermia Neg Hx   . Pseudochol deficiency Neg Hx   . Coronary artery disease     History   Social History  . Marital Status: Divorced    Spouse Name: N/A    Number of Children: 0  . Years of Education: N/A   Occupational History  . disabled    Social History Main Topics  . Smoking status: Current Everyday Smoker -- 0.5 packs/day for 10 years    Types: Cigarettes  . Smokeless tobacco: Not on  file  . Alcohol Use: Yes     occassional  . Drug Use: 1 per week    Special: Marijuana     last use couple wks ago  . Sexually Active: Yes    Birth Control/ Protection: Other-see comments     pull-out  Review of Systems: Gen: Denies any fever, chills, sweats, anorexia, fatigue, weakness, malaise, weight loss, and sleep disorder CV: Denies chest pain, angina, palpitations, syncope, orthopnea, PND, peripheral edema, and claudication. Resp: Recent cefzil & z pack for chest congestion.  Chronic cough, nonproductive at this point.  Denies dyspnea at rest, dyspnea with exercise, sputum, wheezing, coughing up blood, and pleurisy. GI: Denies vomiting blood, jaundice, and fecal incontinence.    GU : Denies urinary burning, blood in urine, urinary frequency, urinary hesitancy, nocturnal urination, and urinary incontinence. MS: Denies joint  pain, limitation of movement, and swelling, stiffness, low back pain, extremity pain. Denies muscle weakness, cramps, atrophy.  Derm: Denies rash, itching, dry skin, hives, moles, warts, or unhealing ulcers.  Psych: Denies depression, anxiety, memory loss, suicidal ideation, hallucinations, paranoia, and confusion. Heme: Denies bruising, bleeding, and enlarged lymph nodes. Neuro:  Denies any headaches, dizziness, paresthesias. Endo:  Denies any problems with DM, thyroid, adrenal function.  Physical Exam: BP 114/65  Pulse 71  Temp(Src) 97.9 F (36.6 C) (Temporal)  Ht 5\' 4"  (1.626 m)  Wt 131 lb (59.421 kg)  BMI 22.49 kg/m2  LMP 11/15/2011 General:   Alert,  Well-developed, well-nourished, pleasant and cooperative in NAD Head:  Normocephalic and atraumatic. Eyes:  Sclera clear, no icterus.   Conjunctiva pink. Ears:  Normal auditory acuity. Nose:  No deformity, discharge, or lesions. Mouth:  No deformity or lesions,oropharynx pink & moist. Neck:  Supple; no masses or thyromegaly. Lungs:  Clear throughout to auscultation.   No wheezes, crackles, or rhonchi. No acute distress. Heart:  Regular rate and rhythm; no murmurs, clicks, rubs,  or gallops. Abdomen:  Normal bowel sounds.  No bruits.  Soft, non-tender and non-distended without masses, hepatosplenomegaly or hernias noted.  No guarding or rebound tenderness.   Rectal:  Deferred. Msk:  Symmetrical without gross deformities. Normal posture. Pulses:  Normal pulses noted. Extremities:  No clubbing or edema. Neurologic:  Alert and oriented x4;  grossly normal neurologically. Skin:  Intact without significant lesions or rashes. Lymph Nodes:  No significant cervical adenopathy. Psych:  Alert and cooperative. Normal mood and affect.

## 2011-11-22 NOTE — Telephone Encounter (Signed)
Agree. Thanks

## 2011-11-22 NOTE — Procedures (Signed)
NAMECLODAGH, ODENTHAL               ACCOUNT NO.:  [id number]  MEDICAL RECORD NO.:  [id number]  LOCATION:  [facility]IO                         FACILITY:  [facility]  PHYSICIAN:  Samaiyah Howes L. Juanetta Gosling, M.D.DATE OF BIRTH:  October 18, 1970  DATE OF PROCEDURE: DATE OF DISCHARGE:                           PULMONARY FUNCTION TEST   Reason for pulmonary function testing is cough. 1. Spirometry shows no ventilatory defect, but does show evidence of     airflow obstruction. 2. Lung volumes show air trapping. 3. DLCO is normal. 4. Airway resistance is normal. 5. Considering the smoking history, this study is consistent with     COPD.     Dora Clauss L. Juanetta Gosling, M.D.     ELH/MEDQ  D:  11/22/2011  T:  11/22/2011  Job:  [id number]

## 2011-11-22 NOTE — Assessment & Plan Note (Signed)
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 Other LFTs are normal.

## 2011-11-22 NOTE — Assessment & Plan Note (Addendum)
Mackenzie Williams is a pleasant 41 y.o. female with 1-2 year history of upper abdominal pain, right greater than left. She was recently found to be H. pylori positive and will need treatment. She is on significant amount of NSAIDs. She will need EGD for further evaluation to rule out peptic ulcer disease and she certainly has risk factors including H. pylori, NSAID use, and smoking.  I have discussed risks & benefits which include, but are not limited to, bleeding, infection, perforation & drug reaction.  The patient agrees with this plan & written consent will be obtained.    Prevpac as directed Hold omeprazole while taking Prevpac and resume once you have finished the pack. 1-800-quit now for help quitting smoking No aspirin, ibuprofen, Motrin, over-the-counter anti-inflammatories or headache powders! Urine drug screen and urine pregnancy test

## 2011-11-23 ENCOUNTER — Encounter: Payer: Self-pay | Admitting: Vascular Surgery

## 2011-11-23 NOTE — Progress Notes (Signed)
Faxed to PCP

## 2011-11-24 ENCOUNTER — Encounter: Payer: Self-pay | Admitting: Vascular Surgery

## 2011-11-24 ENCOUNTER — Telehealth: Payer: Self-pay | Admitting: Gastroenterology

## 2011-11-24 LAB — DRUG SCREEN, URINE
Barbiturate Quant, Ur: NEGATIVE
Cocaine Metabolites: NEGATIVE
Creatinine,U: 107.02 mg/dL
Marijuana Metabolite: POSITIVE — AB
Opiates: NEGATIVE
Phencyclidine (PCP): NEGATIVE
Propoxyphene: NEGATIVE

## 2011-11-24 NOTE — Telephone Encounter (Signed)
I spoke to patient and told her we were going to have to reschedule her procedure and she would need to come back in the office for a new consult 30 days from refraining from marijuana use. Patient understood and she will call back when ready.

## 2011-11-24 NOTE — Progress Notes (Signed)
Quick Note:  Please let patient know results CC; LUKING,SCOTT, MD ______

## 2011-11-24 NOTE — Progress Notes (Signed)
Quick Note:  Please let patient know that urine drug screen was positive. Stopped smoking marijuana. This could be contributing to her symptoms. Patient's procedure will need to be rescheduled when she has a negative urine drug screen. Office visit in 30 days to set up procedure with urine drug screen prior to procedure. CC: Lilyan Punt, MD, MD  ______

## 2011-11-25 ENCOUNTER — Encounter (INDEPENDENT_AMBULATORY_CARE_PROVIDER_SITE_OTHER): Payer: Medicare Other | Admitting: *Deleted

## 2011-11-25 ENCOUNTER — Ambulatory Visit (INDEPENDENT_AMBULATORY_CARE_PROVIDER_SITE_OTHER): Payer: Medicare Other | Admitting: Vascular Surgery

## 2011-11-25 ENCOUNTER — Encounter: Payer: Self-pay | Admitting: Vascular Surgery

## 2011-11-25 VITALS — BP 127/76 | HR 69 | Resp 18 | Ht 64.0 in | Wt 127.2 lb

## 2011-11-25 DIAGNOSIS — I83893 Varicose veins of bilateral lower extremities with other complications: Secondary | ICD-10-CM
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
## 2011-11-26 LAB — PULMONARY FUNCTION TEST

## 2011-12-01 NOTE — Procedures (Unsigned)
LOWER EXTREMITY VENOUS REFLUX EXAM  INDICATION:  Right lower extremity edema.  EXAM:  Using color-flow imaging and pulse Doppler spectral analysis, the right common femoral, femoral, popliteal, posterior tibial, great and small saphenous veins were evaluated.  There is evidence suggesting deep venous insufficiency in the right lower extremity.  The right saphenofemoral junction is not competent with Reflux of >553milliseconds.  The right great saphenous vein has a history of ablation and is not visualized past the saphenofemoral junction.  The right proximal small saphenous vein demonstrates competency.  GSV Diameter (used if found to be incompetent only)                                           Right    Left Proximal Greater Saphenous Vein           cm       cm Proximal-to-mid-thigh                     cm       cm Mid thigh                                 cm       cm Mid-distal thigh                          cm       cm Distal thigh                              cm       cm Knee                                      cm       cm  IMPRESSION: 1. 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 2. The right great saphenous vein is competent due to history of     ablation. 3. The right deep venous system is not competent with Reflux of     >554milliseconds. 4. The right small saphenous vein is competent.  ___________________________________________ Larina Earthly, M.D.  EM/MEDQ  D:  11/26/2011  T:  11/26/2011  Job:  [id number]

## 2011-12-02 ENCOUNTER — Ambulatory Visit: Payer: Medicare Other | Admitting: Urgent Care

## 2011-12-06 ENCOUNTER — Telehealth: Payer: Self-pay

## 2011-12-06 NOTE — Telephone Encounter (Signed)
Pt called- left voicemail- she wants to know if she can take naproxyn for her menstrual pain. She said she will be in the bed for about a week if she doesn't take something. Tried to call pt back to get further information and to see if she is ready for ov to set up procedures- had to [facility].

## 2011-12-06 NOTE — Telephone Encounter (Signed)
Talked with KJ- pt can take naproxyn bid as long as she takes it with PPI and with food and only for short term. Pt can also try heating pad and warm baths. Pt is aware. She is also aware that it can irritate her stomach.

## 2011-12-07 NOTE — Telephone Encounter (Signed)
Can use OTC aleve BID for a couple days with precautions as discussed yesterday.

## 2011-12-07 NOTE — Telephone Encounter (Signed)
Pt called this morning wanting to know if we could call her in do Naproxyn since hers was out of date and that she is under our care. She goes to the [facility] in [place]. Please advise.

## 2011-12-07 NOTE — Telephone Encounter (Signed)
Pt called and was informed.  

## 2011-12-08 ENCOUNTER — Ambulatory Visit (HOSPITAL_COMMUNITY): Admission: RE | Admit: 2011-12-08 | Payer: Medicare Other | Source: Ambulatory Visit | Admitting: Internal Medicine

## 2011-12-08 ENCOUNTER — Encounter (HOSPITAL_COMMUNITY): Admission: RE | Payer: Self-pay | Source: Ambulatory Visit

## 2011-12-08 SURGERY — EGD (ESOPHAGOGASTRODUODENOSCOPY)
Anesthesia: Moderate Sedation

## 2011-12-27 ENCOUNTER — Telehealth: Payer: Self-pay | Admitting: Urgent Care

## 2011-12-27 DIAGNOSIS — F1910 Other psychoactive substance abuse, uncomplicated: Secondary | ICD-10-CM

## 2011-12-27 NOTE — Telephone Encounter (Signed)
Please call pt.  She needs OV to set up EGD please.  UDS 1 week prior to OV. Thanks

## 2011-12-27 NOTE — Telephone Encounter (Signed)
Pt has an appointment on July 22 @1 :30 with [facility].

## 2012-01-03 NOTE — Telephone Encounter (Signed)
Pt is aware and will go to the lab Tuesday morning.

## 2012-01-03 NOTE — Telephone Encounter (Signed)
Please call to have pt do urine drug screen today or tomorrow. Thanks

## 2012-01-06 LAB — DRUG SCREEN, URINE
Barbiturate Quant, Ur: NEGATIVE
Creatinine,U: 84.56 mg/dL
Methadone: NEGATIVE
Opiates: NEGATIVE
Propoxyphene: NEGATIVE

## 2012-01-06 NOTE — Telephone Encounter (Signed)
Quick Note:  Pt has upcoming OV. Please let her know her UDS is positive again. She will to stop using marijuana & seek counseling if she cannot do this on her own. Thanks ZO:XWRUEA,VWUJW, MD  ______

## 2012-01-06 NOTE — Progress Notes (Signed)
REVIEWED.  

## 2012-01-10 ENCOUNTER — Encounter: Payer: Self-pay | Admitting: Gastroenterology

## 2012-01-10 ENCOUNTER — Ambulatory Visit (INDEPENDENT_AMBULATORY_CARE_PROVIDER_SITE_OTHER): Payer: Medicare Other | Admitting: Gastroenterology

## 2012-01-10 VITALS — BP 115/65 | HR 72 | Temp 98.2°F | Ht 64.0 in | Wt 132.0 lb

## 2012-01-10 DIAGNOSIS — A048 Other specified bacterial intestinal infections: Secondary | ICD-10-CM

## 2012-01-10 DIAGNOSIS — R1010 Upper abdominal pain, unspecified: Secondary | ICD-10-CM

## 2012-01-10 DIAGNOSIS — R109 Unspecified abdominal pain: Secondary | ICD-10-CM

## 2012-01-10 DIAGNOSIS — K589 Irritable bowel syndrome without diarrhea: Secondary | ICD-10-CM

## 2012-01-10 MED ORDER — OMEPRAZOLE 20 MG PO CPDR: 20.0000 mg | DELAYED_RELEASE_CAPSULE | Freq: Two times a day (BID) | ORAL | Status: DC

## 2012-01-10 MED ORDER — POLYETHYLENE GLYCOL 3350 17 GM/SCOOP PO POWD: 17.0000 g | Freq: Every day | ORAL | Status: AC

## 2012-01-10 NOTE — Patient Instructions (Addendum)
Only take Tylenol for pain.  Stop marijuana.  We will be rechecking another drug screen in about one month, just prior to your appointment with [place].  At that appointment, we will discuss proceeding with an upper endoscopy.   Review the lowfat diet and high fiber diet.  Please contact us if any bright red blood in stool, dark and tarry stool, or worsening abdominal pain.  Increase the Omeprazole to twice a day. I have sent this to the pharmacy. You may take Miralax daily as needed for constipation.  We will see you back in 4 weeks.

## 2012-01-10 NOTE — Progress Notes (Signed)
Referring Provider: Babs Sciara, MD Primary Care Physician:  Lilyan Punt, MD Primary Gastroenterologist: Dr. Jena Gauss   Chief Complaint  Patient presents with  . Follow-up    HPI:   41 year old female who presents today again in follow-up to set up EGD. Referred to [place] originally secondary to +H.pylori antibody, given Prevpac. Noted chronic upper abdominal pain for 1-2 years, RUQ worse than LUQ. States RUQ pain wraps around side. +reflux. Omeprazole, not taking every day.  Burning with eating. Feels like fire. Prior to consultation with [place] was using NSAIDs, now no longer taking.   Hx of wt loss, reports 6 to 7 lbs. EGD cancelled secondary to positive UDS. Completed Prevpac. Continued abdominal pain, notes in mid-section.  UDS showed marijuana metabolites on January 06, 2012. Reports last smoked July 4th. Upon further examination and instruction that she could possibly be discharged from practice if repeated +drug screens, she admitted to using a few days ago.   Reports abdominal pain as intermittent. Notes mixed constipation, diarrhea, unpredictable. Occasional fecal urgency. Not following high fiber diet. Denies hematochezia. States she thinks she has noted melena but unsure.    Wt stable since last appt. Usually 6 or 7 lbs heavier per her report.   Past Medical History  Diagnosis Date  . COPD (chronic obstructive pulmonary disease)   . Asthma   . Genital herpes   . Neck pain   . MRSA carrier   . Menorrhagia     [facility]  . H. pylori infection 11/2011  . Varicose veins   . Leg pain, right     Past Surgical History  Procedure Date  . Uterine septum resection 2011  . Uterine fibroid surgery 2011  . Fibula fracture surgery 2010  . Ankle surgery 1999  . Cholecystectomy 04/07/2011    Dr Matilde Haymaker dyskinesia    Current Outpatient Prescriptions  Medication Sig Dispense Refill  . cefPROZIL (CEFZIL) 500 MG tablet Take 500 mg by mouth 2 (two) times daily.      [name] omeprazole  (PRILOSEC) 20 MG capsule Take 1 capsule (20 mg total) by mouth 2 (two) times daily. Take 30 minutes before meal.  60 capsule  3  . oxyCODONE-acetaminophen (PERCOCET/ROXICET) 5-325 MG per tablet Take 1 tablet by mouth every 4 (four) hours as needed.       . valACYclovir (VALTREX) 500 MG tablet Take 500 mg by mouth daily.        . polyethylene glycol powder (GLYCOLAX/MIRALAX) powder Take 17 g by mouth daily. As needed for constipation  255 g  3    Allergies as of 01/10/2012  . (No Known Allergies)    Family History  Problem Relation Age of Onset  . Anesthesia problems Neg Hx   . Hypotension Neg Hx   . Malignant hyperthermia Neg Hx   . Pseudochol deficiency Neg Hx   . Coronary artery disease      History   Social History  . Marital Status: Divorced    Spouse Name: N/A    Number of Children: 0  . Years of Education: N/A   Occupational History  . disabled    Social History Main Topics  . Smoking status: Current Everyday Smoker -- 0.5 packs/day for 10 years    Types: Cigarettes  . Smokeless tobacco: None  . Alcohol Use: Yes     occassional  . Drug Use: 1 per week    Special: Marijuana, Oxycodone     last use couple wks ago  . Sexually  Active: Yes    Birth Control/ Protection: Other-see comments     pull-out   Other Topics Concern  . None   Social History Narrative  . None    Review of Systems: Gen: + weight loss CV: Denies chest pain, palpitations, syncope, peripheral edema, and claudication. Resp: Denies dyspnea at rest, cough, wheezing, coughing up blood, and pleurisy. GI: Denies vomiting blood, jaundice, and fecal incontinence.   Denies dysphagia or odynophagia. Derm: Denies rash, itching, dry skin Psych: + memory loss Heme: Denies bruising, bleeding, and enlarged lymph nodes.  Physical Exam: BP 115/65  Pulse 72  Temp 98.2 F (36.8 C) (Temporal)  Ht 5\' 4"  (1.626 m)  Wt 132 lb (59.875 kg)  BMI 22.66 kg/m2  LMP 01/03/2012 General:   Alert and oriented.  No distress noted. Pleasant and cooperative.  Head:  Normocephalic and atraumatic. Eyes:  Conjuctiva clear without scleral icterus. Mouth:  Oral mucosa pink and moist.  Neck:  Supple, without mass or thyromegaly. Heart:  S1, S2 present without murmurs, rubs, or gallops. Regular rate and rhythm. Abdomen:  +BS, soft, non-tender and non-distended. No rebound or guarding. No HSM or masses noted. Msk:  Symmetrical without gross deformities. Normal posture. Extremities:  Without edema. Neurologic:  Alert and  oriented x4;  grossly normal neurologically. Skin:  Intact without significant lesions or rashes. Cervical Nodes:  No significant cervical adenopathy. Psych:  Alert and cooperative. Normal mood and affect.

## 2012-01-11 DIAGNOSIS — K589 Irritable bowel syndrome without diarrhea: Secondary | ICD-10-CM | POA: Insufficient documentation

## 2012-01-11 NOTE — Assessment & Plan Note (Signed)
Appears element of chronic abdominal pain yet does have several risk factors for PUD. Proceed with EGD as soon as negative drug screen.

## 2012-01-11 NOTE — Assessment & Plan Note (Signed)
Alternating constipation and diarrhea, no hematochezia. Utilize Miralax, low fat and high fiber diet, follow-up 1 month.

## 2012-01-11 NOTE — Assessment & Plan Note (Addendum)
41 year old with recent positive antibody, treated with Prevpac. Continued reports of upper abdominal pain, exacerbated by eating, believes may have noted melena in past. +nausea. Not taking Prilosec daily as instructed. Wt stable from last appt but believes overall has lost 6-7 lbs. Drug screen positive for marijuana after pt had been thoroughly counseled on cessation. Last used a few days ago. Discussed in detail inability to proceed unless negative drug screen. Pt to abstain and would like to recheck drug screen in one month. Discussed possible dismissal from practice if continued positive screens, at the discretion of the MD.  Ultimately needs EGD to assess likely PUD secondary to H.pylori, prior NSAID use. Other factors not excluded to include chronic abdominal pain, possible component of IBS.   Recheck UDS in one month.  EGD to be scheduled once UDS negative.  Prilosec BID Return in 4 weeeks

## 2012-01-12 NOTE — Progress Notes (Signed)
Faxed to PCP, [employer]

## 2012-01-18 ENCOUNTER — Encounter: Payer: Medicare Other | Admitting: Vascular Surgery

## 2012-02-10 ENCOUNTER — Ambulatory Visit (INDEPENDENT_AMBULATORY_CARE_PROVIDER_SITE_OTHER): Payer: Medicare Other | Admitting: Gastroenterology

## 2012-02-10 ENCOUNTER — Encounter: Payer: Self-pay | Admitting: Gastroenterology

## 2012-02-10 ENCOUNTER — Telehealth: Payer: Self-pay | Admitting: *Deleted

## 2012-02-10 VITALS — BP 101/61 | HR 83 | Temp 98.1°F | Ht 64.0 in | Wt 122.6 lb

## 2012-02-10 DIAGNOSIS — K589 Irritable bowel syndrome without diarrhea: Secondary | ICD-10-CM

## 2012-02-10 DIAGNOSIS — M949 Disorder of cartilage, unspecified: Secondary | ICD-10-CM

## 2012-02-10 DIAGNOSIS — M899 Disorder of bone, unspecified: Secondary | ICD-10-CM

## 2012-02-10 DIAGNOSIS — A048 Other specified bacterial intestinal infections: Secondary | ICD-10-CM

## 2012-02-10 DIAGNOSIS — R5383 Other fatigue: Secondary | ICD-10-CM

## 2012-02-10 DIAGNOSIS — R109 Unspecified abdominal pain: Secondary | ICD-10-CM

## 2012-02-10 DIAGNOSIS — R1010 Upper abdominal pain, unspecified: Secondary | ICD-10-CM

## 2012-02-10 LAB — CBC WITH DIFFERENTIAL/PLATELET
Basophils Absolute: 0 10*3/uL (ref 0.0–0.1)
Basophils Relative: 1 % (ref 0–1)
MCHC: 32.9 g/dL (ref 30.0–36.0)
Monocytes Absolute: 0.8 10*3/uL (ref 0.1–1.0)
Neutro Abs: 4.1 10*3/uL (ref 1.7–7.7)
Neutrophils Relative %: 50 % (ref 43–77)
Platelets: 201 10*3/uL (ref 150–400)
RDW: 13.8 % (ref 11.5–15.5)
WBC: 8.2 10*3/uL (ref 4.0–10.5)

## 2012-02-10 MED ORDER — ONDANSETRON 4 MG PO TBDP: 4.0000 mg | ORAL_TABLET | Freq: Three times a day (TID) | ORAL | Status: DC | PRN

## 2012-02-10 NOTE — Patient Instructions (Addendum)
Please have blood work and urine sample completed. We will call you with the results.  I will talk with Dr. Jena Gauss about the next step. Do not use any more marijuana.   I have sent a prescription for nausea to the pharmacy.

## 2012-02-10 NOTE — Telephone Encounter (Signed)
Mackenzie Williams called after her appointment. She would like to speak with Mackenzie Williams about the diagnosis she put on her sheet about the disorder of the skeletal system, what it is referring to other than the metal in her ankle or if it is something more. Please call her back. Thank you.

## 2012-02-10 NOTE — Progress Notes (Signed)
Referring Provider: Luking, Scott A, MD Primary Care Physician:  LUKING,SCOTT, MD Primary Gastroenterologist: Dr. Rourk   Chief Complaint  Patient presents with  . Follow-up    HPI:   41-year-old female who presents today again in follow-up to set up EGD. Referred to us originally secondary to +H.pylori antibody, given Prevpac. Noted chronic upper abdominal pain for 1-2 years, RUQ worse than LUQ. States RUQ pain wraps around side. +reflux. Omeprazole, not taking every day. Burning with eating. Feels like fire. Prior to consultation with us was using NSAIDs, now no longer taking.  Lost 10 lbs since appt one month ago. EGD cancelled secondary to positive UDS for mariuana. Completed Prevpac. Continued abdominal pain, notes in mid-section. Doesn't feel good anymore to eat. Used to love eating. +nausea, no vomiting. Wishes she could throw up.  UDS showed marijuana metabolites on January 06, 2012. Reports last smoked July 4th at last visit. Today states smoked again Aug 3.    Reports abdominal pain as intermittent. Notes mixed constipation, diarrhea, unpredictable. Occasional fecal urgency. Not following high fiber diet. Funds difficult, hard to buy high fiber foods. Making bad food choices. Denies hematochezia. States she thinks she has noted melena but unsure. Feels fatigued and weak.    Past Medical History  Diagnosis Date  . COPD (chronic obstructive pulmonary disease)   . Asthma   . Genital herpes   . Neck pain   . MRSA carrier   . Menorrhagia     [facility]  . H. pylori infection 11/2011  . Varicose veins   . Leg pain, right     Past Surgical History  Procedure Date  . Uterine septum resection 2011  . Uterine fibroid surgery 2011  . Fibula fracture surgery 2010  . Ankle surgery 1999  . Cholecystectomy 04/07/2011    Dr Jenkins-biliary dyskinesia    Current Outpatient Prescriptions  Medication Sig Dispense Refill  . omeprazole (PRILOSEC) 20 MG capsule Take 1 capsule (20 mg  total) by mouth 2 (two) times daily. Take 30 minutes before meal.  60 capsule  3  . valACYclovir (VALTREX) 500 MG tablet Take 500 mg by mouth daily.        . cefPROZIL (CEFZIL) 500 MG tablet Take 500 mg by mouth 2 (two) times daily.      . oxyCODONE-acetaminophen (PERCOCET/ROXICET) 5-325 MG per tablet Take 1 tablet by mouth every 4 (four) hours as needed.         Allergies as of 02/10/2012  . (No Known Allergies)    Family History  Problem Relation Age of Onset  . Anesthesia problems Neg Hx   . Hypotension Neg Hx   . Malignant hyperthermia Neg Hx   . Pseudochol deficiency Neg Hx   . Coronary artery disease      History   Social History  . Marital Status: Divorced    Spouse Name: N/A    Number of Children: 0  . Years of Education: N/A   Occupational History  . disabled    Social History Main Topics  . Smoking status: Current Everyday Smoker -- 0.5 packs/day for 10 years    Types: Cigarettes  . Smokeless tobacco: None  . Alcohol Use: Yes     occassional  . Drug Use: 1 per week    Special: Marijuana, Oxycodone     last use couple wks ago  . Sexually Active: Yes    Birth Control/ Protection: Other-see comments     pull-out   Other Topics Concern  .   None   Social History Narrative  . None    Review of Systems: Gen: SEE HPI CV: Denies chest pain, palpitations, syncope, peripheral edema, and claudication. Resp: Denies dyspnea at rest, cough, wheezing, coughing up blood, and pleurisy. GI: Denies vomiting blood, jaundice, and fecal incontinence.   Denies dysphagia or odynophagia. Derm: Denies rash, itching, dry skin Psych: Denies depression, anxiety, memory loss, confusion. No homicidal or suicidal ideation.  Heme: Denies bruising, bleeding, and enlarged lymph nodes.  Physical Exam: BP 101/61  Pulse 83  Temp 98.1 F (36.7 C) (Temporal)  Ht 5' 4" (1.626 m)  Wt 122 lb 9.6 oz (55.611 kg)  BMI 21.04 kg/m2  LMP 02/10/2012 General:   Alert and oriented. No  distress noted. Pleasant and cooperative.  Head:  Normocephalic and atraumatic. Eyes:  Conjuctiva clear without scleral icterus. Mouth:  Oral mucosa pink and moist. Good dentition. No lesions. Heart:  S1, S2 present without murmurs, rubs, or gallops. Regular rate and rhythm. Abdomen:  +BS, soft, TTP epigastric region and non-distended. No rebound or guarding. No HSM or masses noted. Msk:  Symmetrical without gross deformities. Normal posture. Pulses:  2+ DP noted bilaterally Extremities:  Without edema. Neurologic:  Alert and  oriented x4;  grossly normal neurologically. Skin:  Intact without significant lesions or rashes. Psych:  Alert and cooperative. Normal mood and affect.  

## 2012-02-10 NOTE — Telephone Encounter (Signed)
Pt aware.

## 2012-02-11 LAB — COMPREHENSIVE METABOLIC PANEL
ALT: 21 U/L (ref 0–35)
AST: 18 U/L (ref 0–37)
Albumin: 3.9 g/dL (ref 3.5–5.2)
Alkaline Phosphatase: 47 U/L (ref 39–117)
BUN: 12 mg/dL (ref 6–23)
Potassium: 3.7 mEq/L (ref 3.5–5.3)
Sodium: 139 mEq/L (ref 135–145)

## 2012-02-11 LAB — URINALYSIS W MICROSCOPIC + REFLEX CULTURE
Bacteria, UA: NONE SEEN
Bilirubin Urine: NEGATIVE
Casts: NONE SEEN
Crystals: NONE SEEN
Ketones, ur: NEGATIVE mg/dL
Specific Gravity, Urine: 1.01 (ref 1.005–1.030)
Urobilinogen, UA: 0.2 mg/dL (ref 0.0–1.0)
pH: 5.5 (ref 5.0–8.0)

## 2012-02-14 ENCOUNTER — Telehealth: Payer: Self-pay | Admitting: Gastroenterology

## 2012-02-14 DIAGNOSIS — R5383 Other fatigue: Secondary | ICD-10-CM | POA: Insufficient documentation

## 2012-02-14 NOTE — Assessment & Plan Note (Addendum)
41 year old female with hx of +H.pylori serology s/p Prevpac treatment, with chronic upper abdominal pain. Concern for PUD. Pt has failed one drug screen for marijuana. States she last smoked a few weeks ago. Continues to lose weight. Dyspepsia concerning, unable to rule out occult malignancy though less likely. Needs EGD in near future. Patient admonished to continue to abstain from marijuana.  Proceed with upper endoscopy in the near future with Dr. Jena Gauss. The risks, benefits, and alternatives have been discussed in detail with patient. They have stated understanding and desire to proceed.  PHENERGAN 25 mg IV to help facilitate sedation.   Addendum 8/29: After review of labs, no significant abnormalities. 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 Last CT in 2006. Consider CT if no etiology for abdominal pain found on EGD.

## 2012-02-14 NOTE — Assessment & Plan Note (Signed)
Unclear etiology. ?dietary habits, intake. Obtain labs: CBC, CMP, TSH. At pt's request, Vit D level drawn and UA with culture due to malodorous urine, ?UTI.

## 2012-02-14 NOTE — Telephone Encounter (Signed)
Please let pt know Dr. Jena Gauss will proceed with EGD. Needs EGD set up, Phenergan 25 mg on call.

## 2012-02-14 NOTE — Assessment & Plan Note (Addendum)
Alternating constipation, diarrhea. Notes corresponding with food intake. Financial difficulties make food choices challenging. Discussed high fiber diet. No rectal bleeding noted.

## 2012-02-15 ENCOUNTER — Other Ambulatory Visit: Payer: Self-pay | Admitting: Internal Medicine

## 2012-02-15 DIAGNOSIS — R1310 Dysphagia, unspecified: Secondary | ICD-10-CM

## 2012-02-15 NOTE — Progress Notes (Signed)
Faxed to PCP

## 2012-02-15 NOTE — Telephone Encounter (Signed)
Scheduled with RMR on Sept 12th and instructions were mailed to the patient and she is aware

## 2012-02-17 ENCOUNTER — Other Ambulatory Visit: Payer: Self-pay | Admitting: Gastroenterology

## 2012-02-17 MED ORDER — VITAMIN D 400 UNITS PO CAPS: ORAL_CAPSULE | ORAL | Status: DC

## 2012-02-17 NOTE — Progress Notes (Signed)
Quick Note:  Tbili mildly elevated, hx of Gilbert's. No anemia. TSH normal Vit D low normal. Start taking Vitamin D 800 iu daily. This can be found OTC. Keep plans for EGD.  ______

## 2012-02-17 NOTE — Progress Notes (Signed)
Quick Note:  Yes. Will order. ______

## 2012-02-17 NOTE — Progress Notes (Signed)
Pt aware.

## 2012-02-18 ENCOUNTER — Telehealth: Payer: Self-pay | Admitting: Internal Medicine

## 2012-02-18 ENCOUNTER — Encounter (HOSPITAL_COMMUNITY): Payer: Self-pay | Admitting: Pharmacy Technician

## 2012-02-18 NOTE — Telephone Encounter (Signed)
Please return patient's call at [phone] regarding her prescription of Vit D

## 2012-02-18 NOTE — Telephone Encounter (Signed)
Called Pt and answered her question

## 2012-03-01 MED ORDER — SODIUM CHLORIDE 0.45 % IV SOLN
INTRAVENOUS | Status: DC
  Administered 2012-03-02: 1000 mL via INTRAVENOUS

## 2012-03-02 ENCOUNTER — Other Ambulatory Visit: Payer: Self-pay | Admitting: Internal Medicine

## 2012-03-02 ENCOUNTER — Ambulatory Visit (HOSPITAL_COMMUNITY)
Admission: RE | Admit: 2012-03-02 | Discharge: 2012-03-02 | Disposition: A | Discharge status: Home Health | Payer: PRIVATE HEALTH INSURANCE | Source: Ambulatory Visit | Attending: Internal Medicine | Admitting: Internal Medicine

## 2012-03-02 ENCOUNTER — Encounter (HOSPITAL_COMMUNITY)
Admission: RE | Disposition: A | Discharge status: Home Health | Payer: Self-pay | Source: Ambulatory Visit | Attending: Internal Medicine

## 2012-03-02 ENCOUNTER — Encounter (HOSPITAL_COMMUNITY): Payer: Self-pay | Admitting: *Deleted

## 2012-03-02 ENCOUNTER — Telehealth: Payer: Self-pay | Admitting: Internal Medicine

## 2012-03-02 DIAGNOSIS — J4489 Other specified chronic obstructive pulmonary disease: Secondary | ICD-10-CM | POA: Insufficient documentation

## 2012-03-02 DIAGNOSIS — R109 Unspecified abdominal pain: Secondary | ICD-10-CM

## 2012-03-02 DIAGNOSIS — D131 Benign neoplasm of stomach: Secondary | ICD-10-CM | POA: Insufficient documentation

## 2012-03-02 DIAGNOSIS — K2940 Chronic atrophic gastritis without bleeding: Secondary | ICD-10-CM | POA: Insufficient documentation

## 2012-03-02 DIAGNOSIS — K449 Diaphragmatic hernia without obstruction or gangrene: Secondary | ICD-10-CM

## 2012-03-02 DIAGNOSIS — F1210 Cannabis abuse, uncomplicated: Secondary | ICD-10-CM | POA: Insufficient documentation

## 2012-03-02 DIAGNOSIS — R1310 Dysphagia, unspecified: Secondary | ICD-10-CM

## 2012-03-02 DIAGNOSIS — J449 Chronic obstructive pulmonary disease, unspecified: Secondary | ICD-10-CM | POA: Insufficient documentation

## 2012-03-02 HISTORY — PX: ESOPHAGOGASTRODUODENOSCOPY: SHX5428

## 2012-03-02 SURGERY — EGD (ESOPHAGOGASTRODUODENOSCOPY)
Anesthesia: Moderate Sedation

## 2012-03-02 MED ORDER — PROMETHAZINE HCL 25 MG/ML IJ SOLN
25.0000 mg | Freq: Once | INTRAMUSCULAR | Status: AC
  Administered 2012-03-02: 25 mg via INTRAVENOUS

## 2012-03-02 MED ORDER — MIDAZOLAM HCL 5 MG/5ML IJ SOLN
INTRAMUSCULAR | Status: DC | PRN
  Administered 2012-03-02 (×3): 2 mg via INTRAVENOUS

## 2012-03-02 MED ORDER — MEPERIDINE HCL 100 MG/ML IJ SOLN
INTRAMUSCULAR | Status: AC
  Filled 2012-03-02: qty 2

## 2012-03-02 MED ORDER — PROMETHAZINE HCL 25 MG/ML IJ SOLN
INTRAMUSCULAR | Status: AC
  Filled 2012-03-02: qty 1

## 2012-03-02 MED ORDER — MIDAZOLAM HCL 5 MG/5ML IJ SOLN
INTRAMUSCULAR | Status: AC
  Filled 2012-03-02: qty 10

## 2012-03-02 MED ORDER — STERILE WATER FOR IRRIGATION IR SOLN
Status: DC | PRN
  Administered 2012-03-02: 08:00:00

## 2012-03-02 MED ORDER — MEPERIDINE HCL 100 MG/ML IJ SOLN
INTRAMUSCULAR | Status: DC | PRN
  Administered 2012-03-02 (×2): 50 mg via INTRAVENOUS

## 2012-03-02 MED ORDER — BUTAMBEN-TETRACAINE-BENZOCAINE 2-2-14 % EX AERO
INHALATION_SPRAY | CUTANEOUS | Status: DC | PRN
  Administered 2012-03-02: 1 via TOPICAL

## 2012-03-02 MED ORDER — SODIUM CHLORIDE 0.9 % IJ SOLN
INTRAMUSCULAR | Status: AC
  Filled 2012-03-02: qty 10

## 2012-03-02 NOTE — Op Note (Signed)
[facility] [address] [place], [address]   ENDOSCOPY PROCEDURE REPORT  PATIENT: Mackenzie Williams, Mackenzie Williams  MR#: [id number] BIRTHDATE: 05/31/1971 , 41  yrs. old GENDER: Female ENDOSCOPIST: R.  Roetta Sessions, MD FACP FACG REFERRED BY:  Lilyan Punt, M.D. PROCEDURE DATE:  03/02/2012 PROCEDURE:     EGD with gastric biopsy  INDICATIONS:   Abdominal pain history of H. pylori based on serologies-status post treatment. Illicit drug use  INFORMED CONSENT:   The risks, benefits, limitations, alternatives and imponderables have been discussed.  The potential for biopsy, esophogeal dilation, etc. have also been reviewed.  Questions have been answered.  All parties agreeable.  Please see the history and physical in the medical record for more information.  MEDICATIONS:   Versed 6 mg IV and Demerol 100 mg IV in divided doses. Cetacaine spray . Phenergan 25 mg IV  DESCRIPTION OF PROCEDURE:   The EC-[id number] ([id number]) and EG-[id number] ([id number])  endoscope was introduced through the mouth and advanced to the second portion of the duodenum without difficulty or limitations.  The mucosal surfaces were surveyed very carefully during advancement of the scope and upon withdrawal.  Retroflexion view of the proximal stomach and esophagogastric junction was performed.      FINDINGS:normal tubular esophagus. Stomach empty. Scattered one 3 mm gastric polyps. Fine submucosal petechiae patchy gastric erythema. No ulcer or infiltrating process. Small hiatal hernia. Pylorus patent. Normal first and second portion of the duodenum  THERAPEUTIC / DIAGNOSTIC MANEUVERS PERFORMED:  biopsy gastric mucosa were taken to assess for H. pylori, etc. Biopsies were the gastric polyps taken.   COMPLICATIONS:  None  IMPRESSION: Small hiatal hernia. Gastric polyp and abnormal gastric mucosa-of doubtful clinical significance-status post biopsy. No explanation for abdominal pain based on today's  examination.  RECOMMENDATIONS:  Proceed with abdominal/ pelvic contrast CT. Stop using illicit drugs. Further recommendations to follow pending review of pathology report    _______________________________ R. Roetta Sessions, MD FACP [facility] eSigned:  R. Roetta Sessions, MD FACP [facility] 03/02/2012 8:13 AM     CC:  PATIENT NAME:  Cherena, Malave MR#: [id number]

## 2012-03-02 NOTE — Telephone Encounter (Signed)
Message copied by Glendora Score on Thu Mar 02, 2012 10:40 AM ------      Message from: [place], Jane Canary      Created: Thu Mar 02, 2012  9:53 AM      Regarding: CT SCAN       Per RMR pt needs a CT Scan of the ABD/Pelvis with IV/Oral CM for abd pain and negative egd

## 2012-03-02 NOTE — Telephone Encounter (Signed)
Patient is scheduled for CT abd/pel w/IV/Oral CM on Wednesday Sept 18th 2013 and she is aware to go by an pick up oral contrast

## 2012-03-02 NOTE — H&P (View-Only) (Signed)
Referring Provider: Babs Sciara, MD Primary Care Physician:  Lilyan Punt, MD Primary Gastroenterologist: Dr. Jena Gauss   Chief Complaint  Patient presents with  . Follow-up    HPI:   41 year old female who presents today again in follow-up to set up EGD. Referred to [place] originally secondary to +H.pylori antibody, given Prevpac. Noted chronic upper abdominal pain for 1-2 years, RUQ worse than LUQ. States RUQ pain wraps around side. +reflux. Omeprazole, not taking every day. Burning with eating. Feels like fire. Prior to consultation with [place] was using NSAIDs, now no longer taking.  Lost 10 lbs since appt one month ago. EGD cancelled secondary to positive UDS for mariuana. Completed Prevpac. Continued abdominal pain, notes in mid-section. Doesn't feel good anymore to eat. Used to love eating. +nausea, no vomiting. Wishes she could throw up.  UDS showed marijuana metabolites on January 06, 2012. Reports last smoked July 4th at last visit. Today states smoked again Aug 3.    Reports abdominal pain as intermittent. Notes mixed constipation, diarrhea, unpredictable. Occasional fecal urgency. Not following high fiber diet. Funds difficult, hard to buy high fiber foods. Making bad food choices. Denies hematochezia. States she thinks she has noted melena but unsure. Feels fatigued and weak.    Past Medical History  Diagnosis Date  . COPD (chronic obstructive pulmonary disease)   . Asthma   . Genital herpes   . Neck pain   . MRSA carrier   . Menorrhagia     [facility]  . H. pylori infection 11/2011  . Varicose veins   . Leg pain, right     Past Surgical History  Procedure Date  . Uterine septum resection 2011  . Uterine fibroid surgery 2011  . Fibula fracture surgery 2010  . Ankle surgery 1999  . Cholecystectomy 04/07/2011    Dr Matilde Haymaker dyskinesia    Current Outpatient Prescriptions  Medication Sig Dispense Refill  . omeprazole (PRILOSEC) 20 MG capsule Take 1 capsule (20 mg  total) by mouth 2 (two) times daily. Take 30 minutes before meal.  60 capsule  3  . valACYclovir (VALTREX) 500 MG tablet Take 500 mg by mouth daily.        . cefPROZIL (CEFZIL) 500 MG tablet Take 500 mg by mouth 2 (two) times daily.      [name] oxyCODONE-acetaminophen (PERCOCET/ROXICET) 5-325 MG per tablet Take 1 tablet by mouth every 4 (four) hours as needed.         Allergies as of 02/10/2012  . (No Known Allergies)    Family History  Problem Relation Age of Onset  . Anesthesia problems Neg Hx   . Hypotension Neg Hx   . Malignant hyperthermia Neg Hx   . Pseudochol deficiency Neg Hx   . Coronary artery disease      History   Social History  . Marital Status: Divorced    Spouse Name: N/A    Number of Children: 0  . Years of Education: N/A   Occupational History  . disabled    Social History Main Topics  . Smoking status: Current Everyday Smoker -- 0.5 packs/day for 10 years    Types: Cigarettes  . Smokeless tobacco: None  . Alcohol Use: Yes     occassional  . Drug Use: 1 per week    Special: Marijuana, Oxycodone     last use couple wks ago  . Sexually Active: Yes    Birth Control/ Protection: Other-see comments     pull-out   Other Topics Concern  .  None   Social History Narrative  . None    Review of Systems: Gen: SEE HPI CV: Denies chest pain, palpitations, syncope, peripheral edema, and claudication. Resp: Denies dyspnea at rest, cough, wheezing, coughing up blood, and pleurisy. GI: Denies vomiting blood, jaundice, and fecal incontinence.   Denies dysphagia or odynophagia. Derm: Denies rash, itching, dry skin Psych: Denies depression, anxiety, memory loss, confusion. No homicidal or suicidal ideation.  Heme: Denies bruising, bleeding, and enlarged lymph nodes.  Physical Exam: BP 101/61  Pulse 83  Temp 98.1 F (36.7 C) (Temporal)  Ht 5\' 4"  (1.626 m)  Wt 122 lb 9.6 oz (55.611 kg)  BMI 21.04 kg/m2  LMP 02/10/2012 General:   Alert and oriented. No  distress noted. Pleasant and cooperative.  Head:  Normocephalic and atraumatic. Eyes:  Conjuctiva clear without scleral icterus. Mouth:  Oral mucosa pink and moist. Good dentition. No lesions. Heart:  S1, S2 present without murmurs, rubs, or gallops. Regular rate and rhythm. Abdomen:  +BS, soft, TTP epigastric region and non-distended. No rebound or guarding. No HSM or masses noted. Msk:  Symmetrical without gross deformities. Normal posture. Pulses:  2+ DP noted bilaterally Extremities:  Without edema. Neurologic:  Alert and  oriented x4;  grossly normal neurologically. Skin:  Intact without significant lesions or rashes. Psych:  Alert and cooperative. Normal mood and affect.

## 2012-03-02 NOTE — Interval H&P Note (Signed)
History and Physical Interval Note:  03/02/2012 7:37 AM  Mackenzie Williams  has presented today for surgery, with the diagnosis of DYSPHAGIA  The various methods of treatment have been discussed with the patient and family. After consideration of risks, benefits and other options for treatment, the patient has consented to  Procedure(s) (LRB) with comments: ESOPHAGOGASTRODUODENOSCOPY (EGD) (N/A) - 7:30/GIVE PHENERGAN 25MG  IV 30 MINS PRIOR TO PROCEDURE as a surgical intervention .  The patient's history has been reviewed, patient examined, no change in status, stable for surgery.  I have reviewed the patient's chart and labs.  Questions were answered to the patient's satisfaction.     Eula Listen

## 2012-03-05 ENCOUNTER — Encounter: Payer: Self-pay | Admitting: Internal Medicine

## 2012-03-06 ENCOUNTER — Encounter (HOSPITAL_COMMUNITY): Payer: Self-pay | Admitting: Internal Medicine

## 2012-03-06 ENCOUNTER — Encounter: Payer: Self-pay | Admitting: *Deleted

## 2012-03-08 ENCOUNTER — Ambulatory Visit (HOSPITAL_COMMUNITY)
Admission: RE | Admit: 2012-03-08 | Discharge: 2012-03-08 | Disposition: A | Discharge status: Home / Self Care | Payer: PRIVATE HEALTH INSURANCE | Source: Ambulatory Visit | Attending: Internal Medicine | Admitting: Internal Medicine

## 2012-03-08 ENCOUNTER — Other Ambulatory Visit (HOSPITAL_COMMUNITY)
Admission: RE | Admit: 2012-03-08 | Discharge: 2012-03-08 | Disposition: A | Discharge status: Home / Self Care | Payer: PRIVATE HEALTH INSURANCE | Source: Ambulatory Visit | Attending: Obstetrics and Gynecology | Admitting: Obstetrics and Gynecology

## 2012-03-08 ENCOUNTER — Other Ambulatory Visit: Payer: Self-pay | Admitting: Adult Health

## 2012-03-08 DIAGNOSIS — R110 Nausea: Secondary | ICD-10-CM | POA: Insufficient documentation

## 2012-03-08 DIAGNOSIS — Z01419 Encounter for gynecological examination (general) (routine) without abnormal findings: Secondary | ICD-10-CM | POA: Insufficient documentation

## 2012-03-08 DIAGNOSIS — R634 Abnormal weight loss: Secondary | ICD-10-CM | POA: Insufficient documentation

## 2012-03-08 DIAGNOSIS — Z1151 Encounter for screening for human papillomavirus (HPV): Secondary | ICD-10-CM | POA: Insufficient documentation

## 2012-03-08 DIAGNOSIS — R109 Unspecified abdominal pain: Secondary | ICD-10-CM

## 2012-03-08 DIAGNOSIS — Z113 Encounter for screening for infections with a predominantly sexual mode of transmission: Secondary | ICD-10-CM | POA: Insufficient documentation

## 2012-03-08 MED ORDER — IOHEXOL 300 MG/ML  SOLN
100.0000 mL | Freq: Once | INTRAMUSCULAR | Status: AC | PRN
  Administered 2012-03-08: 100 mL via INTRAVENOUS

## 2012-05-31 ENCOUNTER — Encounter: Payer: Self-pay | Admitting: Vascular Surgery

## 2012-09-13 ENCOUNTER — Other Ambulatory Visit: Payer: Self-pay | Admitting: Advanced Practice Midwife

## 2012-09-19 ENCOUNTER — Telehealth: Payer: Self-pay | Admitting: Obstetrics & Gynecology

## 2012-09-19 NOTE — Telephone Encounter (Signed)
Attempted to contact pt, no answer.       Can you e-scibe Motrin for cramps and pain?

## 2012-09-20 ENCOUNTER — Telehealth: Payer: Self-pay | Admitting: Obstetrics & Gynecology

## 2012-09-20 MED ORDER — KETOROLAC TROMETHAMINE 10 MG PO TABS
10.0000 mg | ORAL_TABLET | Freq: Three times a day (TID) | ORAL | Status: DC | PRN
Start: 2012-09-20 — End: 2013-03-01

## 2012-09-20 NOTE — Telephone Encounter (Signed)
Toradol e prescribed for menstrual pain and cramping.

## 2012-09-21 MED ORDER — IBUPROFEN 800 MG PO TABS: 800.0000 mg | ORAL_TABLET | Freq: Three times a day (TID) | ORAL | Status: DC | PRN

## 2012-09-21 NOTE — Telephone Encounter (Signed)
I have e prescribed Motrin for the patient in the place of toradol.

## 2012-09-22 NOTE — Telephone Encounter (Signed)
Spoke with pt. And let her know Dr. Despina Hidden e prescribed Motrin to pt.'s drug store.

## 2012-09-28 ENCOUNTER — Other Ambulatory Visit: Payer: Self-pay | Admitting: *Deleted

## 2012-09-28 ENCOUNTER — Telehealth: Payer: Self-pay | Admitting: Family Medicine

## 2012-09-28 MED ORDER — PANTOPRAZOLE SODIUM 40 MG PO TBEC: 40.0000 mg | DELAYED_RELEASE_TABLET | Freq: Every day | ORAL | Status: DC

## 2012-09-28 NOTE — Telephone Encounter (Signed)
Med electronically sent to [facility]. Patient aware.

## 2012-09-28 NOTE — Telephone Encounter (Signed)
pantoprazole 40 mg (protonix) #30 6 refills

## 2012-09-28 NOTE — Telephone Encounter (Signed)
Pt wants to know if she should continue with the pantoprazole medication you issued her at her last visit.  Pts will need a refill if so, she says she is still having some burning in her stomach but not as bad.  Please call into [facility]

## 2012-10-10 ENCOUNTER — Other Ambulatory Visit: Payer: Self-pay | Admitting: Obstetrics & Gynecology

## 2012-10-12 ENCOUNTER — Other Ambulatory Visit: Payer: Self-pay | Admitting: *Deleted

## 2012-10-12 MED ORDER — VALACYCLOVIR HCL 500 MG PO TABS: 500.0000 mg | ORAL_TABLET | Freq: Every day | ORAL | Status: DC

## 2012-11-07 ENCOUNTER — Other Ambulatory Visit: Payer: Self-pay | Admitting: Obstetrics & Gynecology

## 2012-11-14 ENCOUNTER — Other Ambulatory Visit: Payer: Self-pay | Admitting: Obstetrics & Gynecology

## 2012-11-17 ENCOUNTER — Encounter: Payer: Self-pay | Admitting: *Deleted

## 2012-11-20 ENCOUNTER — Ambulatory Visit (INDEPENDENT_AMBULATORY_CARE_PROVIDER_SITE_OTHER): Payer: Medicare Other | Admitting: Adult Health

## 2012-11-20 ENCOUNTER — Encounter: Payer: Self-pay | Admitting: Adult Health

## 2012-11-20 VITALS — BP 110/72 | Ht 64.0 in | Wt 135.0 lb

## 2012-11-20 DIAGNOSIS — N899 Noninflammatory disorder of vagina, unspecified: Secondary | ICD-10-CM

## 2012-11-20 DIAGNOSIS — N898 Other specified noninflammatory disorders of vagina: Secondary | ICD-10-CM

## 2012-11-20 LAB — POCT WET PREP (WET MOUNT): Bacteria Wet Prep HPF POC: NEGATIVE

## 2012-11-20 NOTE — Progress Notes (Signed)
Subjective:     Patient ID: Mackenzie Williams, female   DOB: 1971/01/25, 42 y.o.   MRN: [id number]  HPI Mackenzie Williams is a 42 year old white female in complaining of vaginal irritation.  Review of Systems Patient denies any headaches, blurred vision, shortness of breath, chest pain, abdominal pain, problems with bowel movements, urination, or intercourse. She complains of vaginal irritation, no mood changes, doesn't care as much about sex, and partner does.    Objective:   Physical Exam Blood pressure 110/72, height 5\' 4"  (1.626 m), weight 135 lb (61.236 kg), last menstrual period 11/13/2012. Skin warm and dry.Pelvic: external genitalia is normal in appearance, vagina: scant white discharge without odor, cervix:smooth and bulbous, uterus: normal size, shape and contour, non tender, no masses felt, adnexa: no masses or tenderness noted. Wet prep: negative   Assessment:     Vaginal irritation    Plan:      Use astro glide with sex Call prn problems

## 2012-11-20 NOTE — Patient Instructions (Addendum)
Try astroglide with sex Call prn problems

## 2012-12-06 ENCOUNTER — Encounter: Payer: Self-pay | Admitting: *Deleted

## 2012-12-14 ENCOUNTER — Other Ambulatory Visit: Payer: Self-pay | Admitting: *Deleted

## 2012-12-14 MED ORDER — VALACYCLOVIR HCL 500 MG PO TABS: 500.0000 mg | ORAL_TABLET | Freq: Every day | ORAL | Status: DC

## 2013-02-12 ENCOUNTER — Encounter: Payer: Self-pay | Admitting: Family Medicine

## 2013-02-12 ENCOUNTER — Ambulatory Visit (INDEPENDENT_AMBULATORY_CARE_PROVIDER_SITE_OTHER): Payer: Medicare Other | Admitting: Family Medicine

## 2013-02-12 VITALS — BP 104/70 | Temp 98.2°F | Ht 64.0 in | Wt 129.0 lb

## 2013-02-12 DIAGNOSIS — M542 Cervicalgia: Secondary | ICD-10-CM | POA: Insufficient documentation

## 2013-02-12 MED ORDER — TRAMADOL HCL 50 MG PO TABS: 50.0000 mg | ORAL_TABLET | Freq: Four times a day (QID) | ORAL | Status: DC | PRN

## 2013-02-12 MED ORDER — CHLORZOXAZONE 500 MG PO TABS: 500.0000 mg | ORAL_TABLET | Freq: Four times a day (QID) | ORAL | Status: DC | PRN

## 2013-02-12 MED ORDER — MELOXICAM 15 MG PO TABS: 15.0000 mg | ORAL_TABLET | Freq: Every day | ORAL | Status: DC

## 2013-02-12 NOTE — Progress Notes (Signed)
  Subjective:    Patient ID: Mackenzie Williams, female    DOB: 1971/04/07, 42 y.o.   MRN: [id number]  Neck Pain  This is a recurrent problem. The current episode started 1 to 4 weeks ago. The problem occurs intermittently. The pain is present in the right side.   Been present off and on for months Recently increasing in pain and stiffness Some down into the arm as well   Review of Systems  HENT: Positive for neck pain.    patient states that the pain sometimes does radiate down the right arm. His been going on for months. To some degree over the past year. The pain does radiate down the right denies any numbness or tingling on the left side. No shortness of breath no loss of bowel or bladder control     Objective:   Physical Exam  Moderate neck pain subjective also tenderness in the right trapezius lungs are clear hearts regular reflexes good strength good      Assessment & Plan:  May need physical therapy May need xray or MRI We will try anti-inflammatories along with muscle relaxer at night along with stretching exercises followup in 4 weeks. If patient not doing better may need x-rays or MRI

## 2013-02-12 NOTE — Patient Instructions (Signed)
mobic daily as anti inflamatory  Muscle relaxer at night as needed  Tramadol as needed for pain but use infrequent  Recheck here in 4 weeks

## 2013-03-01 ENCOUNTER — Ambulatory Visit (INDEPENDENT_AMBULATORY_CARE_PROVIDER_SITE_OTHER): Payer: Medicare Other | Admitting: Nurse Practitioner

## 2013-03-01 ENCOUNTER — Encounter: Payer: Self-pay | Admitting: Nurse Practitioner

## 2013-03-01 VITALS — BP 120/76 | Ht 64.5 in | Wt 130.5 lb

## 2013-03-01 DIAGNOSIS — K2950 Unspecified chronic gastritis without bleeding: Secondary | ICD-10-CM

## 2013-03-01 DIAGNOSIS — N94819 Vulvodynia, unspecified: Secondary | ICD-10-CM

## 2013-03-01 DIAGNOSIS — K2940 Chronic atrophic gastritis without bleeding: Secondary | ICD-10-CM

## 2013-03-01 DIAGNOSIS — J300 Vasomotor rhinitis: Secondary | ICD-10-CM

## 2013-03-01 DIAGNOSIS — J309 Allergic rhinitis, unspecified: Secondary | ICD-10-CM

## 2013-03-01 MED ORDER — PANTOPRAZOLE SODIUM 40 MG PO TBEC: 40.0000 mg | DELAYED_RELEASE_TABLET | Freq: Every day | ORAL | Status: DC

## 2013-03-01 MED ORDER — AMOXICILLIN 875 MG PO TABS: 875.0000 mg | ORAL_TABLET | Freq: Two times a day (BID) | ORAL | Status: DC

## 2013-03-01 MED ORDER — TRIAMCINOLONE ACETONIDE(NASAL) 55 MCG/ACT NA INHA: NASAL | Status: DC

## 2013-03-01 NOTE — Patient Instructions (Signed)
Decrease caffeine and tobacco use. Recommend a daily multivitamin for women. Consider chewable.

## 2013-03-05 ENCOUNTER — Encounter: Payer: Self-pay | Admitting: Nurse Practitioner

## 2013-03-05 DIAGNOSIS — N94819 Vulvodynia, unspecified: Secondary | ICD-10-CM | POA: Insufficient documentation

## 2013-03-05 DIAGNOSIS — K2950 Unspecified chronic gastritis without bleeding: Secondary | ICD-10-CM | POA: Insufficient documentation

## 2013-03-05 LAB — POCT WET PREP WITH KOH
Clue Cells Wet Prep HPF POC: NEGATIVE
RBC Wet Prep HPF POC: NEGATIVE

## 2013-03-05 NOTE — Progress Notes (Signed)
Subjective:  Presents for several complaints. Has been having reflux symptoms off-and-on for the past few months. Has been off her Protonix for while. Bowels normal. Off-and-on stress. Drinks a lot of caffeine. Nonsmoker. Denies any alcohol use. also complaints of head congestion for the past few months. Now having postnasal drainage, occasional cough reducing mucus with slight color. Slight headache. Off-and-on sore throat and right ear pain. No fever. Also complaints of "stinging" outside the vaginal area for the past month. Has been with the same sexual partner for several years. Minimal discharge. Minimal pelvic pain. Has a history of genital herpes, currently on valacyclovir.  Objective:   BP 120/76  Ht 5' 4.5" (1.638 m)  Wt 130 lb 8 oz (59.194 kg)  BMI 22.06 kg/m2  NAD. Alert, oriented. TMs mild clear effusion, no erythema. Pharynx injected with PND noted. Neck supple with mild soft nontender adenopathy. Lungs clear. Heart regular rate rhythm. Abdomen soft nondistended with active bowel sounds; mild epigastric area tenderness, otherwise benign. External GU no erythema or lesions noted. Vagina very minimal amount of white mucoid discharge noted. No CMT. Bimanual exam uterus and adnexa normal limit nontender without masses. Wet prep pH 4.5 and normal.  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 pantoprazole (PROTONIX) 40 MG tablet    Sig: Take 1 tablet (40 mg total) by mouth daily.    Dispense:  30 tablet    Refill:  5    Order Specific Question:  Supervising Provider    Answer:  Merlyn Albert [[id number]]  . amoxicillin (AMOXIL) 875 MG tablet    Sig: Take 1 tablet (875 mg total) by mouth 2 (two) times daily.    Dispense:  20 tablet    Refill:  0    Order Specific Question:  Supervising Provider    Answer:  Merlyn Albert [[id number]]   . triamcinolone (NASACORT AQ) 55 MCG/ACT nasal inhaler    Sig: 2 sprays each nostril once daily prn head congestion    Dispense:  1 Inhaler    Refill:  12    Please dispense prescription strength name brand per Medicaid formulary    Order Specific Question:  Supervising Provider    Answer:  Merlyn Albert [[id number]]   Given written and verbal information on reflux disease. Strongly encouraged patient to wean off caffeine and to decrease or stop smoking. Recheck if symptoms worsen or persist. Advised patient stinging in the vulvar area may be due to the herpes virus, this can occur even if she is on valacyclovir. Continue valacyclovir current dose. Discussed safe sex issues.

## 2013-03-05 NOTE — Assessment & Plan Note (Signed)
.   pantoprazole (PROTONIX) 40 MG tablet    Sig: Take 1 tablet (40 mg total) by mouth daily.    Dispense:  30 tablet    Refill:  5    Order Specific Question:  Supervising Provider    Answer:  LUKING, WILLIAM S [[id number]]  . amoxicillin (AMOXIL) 875 MG tablet    Sig: Take 1 tablet (875 mg total) by mouth 2 (two) times daily.    Dispense:  20 tablet    Refill:  0    Order Specific Question:  Supervising Provider    Answer:  LUKING, WILLIAM S [[id number]]  . triamcinolone (NASACORT AQ) 55 MCG/ACT nasal inhaler    Sig: 2 sprays each nostril once daily prn head congestion    Dispense:  1 Inhaler    Refill:  12    Please dispense prescription strength name brand per Medicaid formulary    Order Specific Question:  Supervising Provider    Answer:  LUKING, WILLIAM S [[id number]]   Given written and verbal information on reflux disease. Strongly encouraged patient to wean off caffeine and to decrease or stop smoking. Recheck if symptoms worsen or persist. Advised patient stinging in the vulvar area may be due to the herpes virus, this can occur even if she is on valacyclovir. Continue valacyclovir current dose. Discussed safe sex issues. 

## 2013-03-05 NOTE — Assessment & Plan Note (Signed)
.   pantoprazole (PROTONIX) 40 MG tablet    Sig: Take 1 tablet (40 mg total) by mouth daily.    Dispense:  30 tablet    Refill:  5    Order Specific Question:  Supervising Provider    Answer:  Merlyn Albert [[id number]]  . amoxicillin (AMOXIL) 875 MG tablet    Sig: Take 1 tablet (875 mg total) by mouth 2 (two) times daily.    Dispense:  20 tablet    Refill:  0    Order Specific Question:  Supervising Provider    Answer:  Merlyn Albert [[id number]]  . triamcinolone (NASACORT AQ) 55 MCG/ACT nasal inhaler    Sig: 2 sprays each nostril once daily prn head congestion    Dispense:  1 Inhaler    Refill:  12    Please dispense prescription strength name brand per Medicaid formulary    Order Specific Question:  Supervising Provider    Answer:  Merlyn Albert [[id number]]   Given written and verbal information on reflux disease. Strongly encouraged patient to wean off caffeine and to decrease or stop smoking. Recheck if symptoms worsen or persist. Advised patient stinging in the vulvar area may be due to the herpes virus, this can occur even if she is on valacyclovir. Continue valacyclovir current dose. Discussed safe sex issues.

## 2013-03-12 ENCOUNTER — Ambulatory Visit: Payer: Medicare Other | Admitting: Family Medicine

## 2013-04-09 ENCOUNTER — Ambulatory Visit (HOSPITAL_COMMUNITY)
Admission: RE | Admit: 2013-04-09 | Discharge: 2013-04-09 | Disposition: A | Discharge status: Home / Self Care | Payer: PRIVATE HEALTH INSURANCE | Source: Ambulatory Visit | Attending: Family Medicine | Admitting: Family Medicine

## 2013-04-09 ENCOUNTER — Encounter: Payer: Self-pay | Admitting: Family Medicine

## 2013-04-09 ENCOUNTER — Ambulatory Visit (INDEPENDENT_AMBULATORY_CARE_PROVIDER_SITE_OTHER): Payer: Medicare Other | Admitting: Family Medicine

## 2013-04-09 VITALS — BP 112/70 | Ht 64.0 in | Wt 129.8 lb

## 2013-04-09 DIAGNOSIS — M542 Cervicalgia: Secondary | ICD-10-CM

## 2013-04-09 DIAGNOSIS — J019 Acute sinusitis, unspecified: Secondary | ICD-10-CM

## 2013-04-09 DIAGNOSIS — M5412 Radiculopathy, cervical region: Secondary | ICD-10-CM

## 2013-04-09 MED ORDER — LEVOFLOXACIN 500 MG PO TABS: 500.0000 mg | ORAL_TABLET | Freq: Every day | ORAL | Status: DC

## 2013-04-09 MED ORDER — DICLOFENAC SODIUM 75 MG PO TBEC: 75.0000 mg | DELAYED_RELEASE_TABLET | Freq: Two times a day (BID) | ORAL | Status: DC

## 2013-04-09 NOTE — Progress Notes (Signed)
  Subjective:    Patient ID: Mackenzie Williams, female    DOB: 01-05-1971, 42 y.o.   MRN: [id number]  HPI Patient is here today for a f/u visit from 8/25 related to a pinch nerve on her right side. She c/o pain in her neck on her right side. She states is mainly pain on the right side the neck into the trapezius sometimes into the arm. These symptoms been going on for over 2 months.  She does not like the Mobic b/c it gave her headaches. She stopped taking that.  She is also having pain in her right ear. Having moderate amount head congestion drainage coughing over the past couple weeks. No wheezing difficulty breathing. Still smokes. No she needs quit. PMH benign family history benign    Review of Systems See above. No high fever no vomiting diarrhea no double vision    Objective:   Physical Exam Lungs are clear hearts regular pulse normal abdomen soft significant tenderness in trapezius into the right shoulder.       Assessment & Plan:  Trapezius pain probable pinched nerve x-rays recommended. May need MRI await the results of the x-ray Sinusitis causing right ear pain and a box prescribe if ongoing trouble the next 2 weeks then we'll need ENT referral call us if any problems.

## 2013-05-23 ENCOUNTER — Other Ambulatory Visit: Payer: Self-pay | Admitting: *Deleted

## 2013-05-23 ENCOUNTER — Telehealth: Payer: Self-pay | Admitting: Family Medicine

## 2013-05-23 DIAGNOSIS — H9201 Otalgia, right ear: Secondary | ICD-10-CM

## 2013-05-23 NOTE — Telephone Encounter (Signed)
Patient is still experiencing ear pain and would like to go through with the referral to a specialist.

## 2013-05-23 NOTE — Telephone Encounter (Signed)
Discussed with patient

## 2013-05-23 NOTE — Telephone Encounter (Signed)
Order for referral put in. [facility] to notifiy patient.

## 2013-05-23 NOTE — Telephone Encounter (Signed)
May refer patient to ENT.

## 2013-05-24 ENCOUNTER — Encounter: Payer: Self-pay | Admitting: Advanced Practice Midwife

## 2013-05-24 ENCOUNTER — Ambulatory Visit (INDEPENDENT_AMBULATORY_CARE_PROVIDER_SITE_OTHER): Payer: Medicare Other | Admitting: Advanced Practice Midwife

## 2013-05-24 VITALS — BP 118/64 | Ht 64.5 in | Wt 132.0 lb

## 2013-05-24 DIAGNOSIS — N898 Other specified noninflammatory disorders of vagina: Secondary | ICD-10-CM

## 2013-05-24 DIAGNOSIS — N94819 Vulvodynia, unspecified: Secondary | ICD-10-CM

## 2013-05-24 MED ORDER — ESCITALOPRAM OXALATE 10 MG PO TABS: 10.0000 mg | ORAL_TABLET | Freq: Every day | ORAL | Status: DC

## 2013-05-24 NOTE — Progress Notes (Signed)
Mackenzie Williams 42 y.o. Past Medical History  Diagnosis Date  . COPD (chronic obstructive pulmonary disease)   . Asthma   . Genital herpes   . Neck pain   . MRSA carrier   . Menorrhagia     [facility]  . H. pylori infection 11/2011  . Varicose veins   . Leg pain, right   . Allergy    Past Surgical History  Procedure Laterality Date  . Uterine septum resection  2011  . Uterine fibroid surgery  2011  . Fibula fracture surgery  2010  . Ankle surgery  1999  . Cholecystectomy  04/07/2011    Dr Matilde Haymaker dyskinesia  . Esophagogastroduodenoscopy  03/02/2012    Procedure: ESOPHAGOGASTRODUODENOSCOPY (EGD);  Surgeon: Corbin Ade, MD;  [facility]: [facility];  Service: Endoscopy;  Laterality: N/A;  7:30/GIVE PHENERGAN 25MG  IV 30 MINS PRIOR TO PROCEDURE   HPI:  C/o vaginal discharge and stinging for a few days.  Has this frequently, sometimes infectious, most times it's not.  Has HSV with "outbreaks", but I've know [name] a long time, and she admits to being overly concerned with her vaginal health at times, possibly misinterpreting a "feeling" for an actual HSV outbreak.  She is on suppressive therapy.  PE:  SSE:  Scant amount of creamy white vaginal discharge. Vagina not erythemous, not odorous, appears normal.  Wet prep negative bacteria, clue, WBC's, yeast, and trich.  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  We discussed other symptoms and I gave her a handout.  Because one of the preferred treatments is an SSRI, and she states that she has "severe" PMS mood changes, we will go ahead and start LExapro 10mg  qd. F/U 4 weeks

## 2013-06-26 ENCOUNTER — Ambulatory Visit: Payer: Medicare Other | Admitting: Advanced Practice Midwife

## 2013-06-28 ENCOUNTER — Ambulatory Visit (INDEPENDENT_AMBULATORY_CARE_PROVIDER_SITE_OTHER): Payer: PRIVATE HEALTH INSURANCE | Admitting: Otolaryngology

## 2013-06-28 DIAGNOSIS — H6990 Unspecified Eustachian tube disorder, unspecified ear: Secondary | ICD-10-CM

## 2013-06-28 DIAGNOSIS — J343 Hypertrophy of nasal turbinates: Secondary | ICD-10-CM

## 2013-06-28 DIAGNOSIS — H903 Sensorineural hearing loss, bilateral: Secondary | ICD-10-CM

## 2013-06-28 DIAGNOSIS — H9209 Otalgia, unspecified ear: Secondary | ICD-10-CM

## 2013-06-28 DIAGNOSIS — H6980 Other specified disorders of Eustachian tube, unspecified ear: Secondary | ICD-10-CM

## 2013-08-29 ENCOUNTER — Encounter: Payer: Self-pay | Admitting: Nurse Practitioner

## 2013-08-29 ENCOUNTER — Ambulatory Visit (INDEPENDENT_AMBULATORY_CARE_PROVIDER_SITE_OTHER): Payer: Medicare Other | Admitting: Nurse Practitioner

## 2013-08-29 VITALS — BP 118/78 | Temp 98.5°F | Ht 64.25 in | Wt 137.0 lb

## 2013-08-29 DIAGNOSIS — H9190 Unspecified hearing loss, unspecified ear: Secondary | ICD-10-CM | POA: Insufficient documentation

## 2013-08-29 DIAGNOSIS — J209 Acute bronchitis, unspecified: Secondary | ICD-10-CM

## 2013-08-29 DIAGNOSIS — J069 Acute upper respiratory infection, unspecified: Secondary | ICD-10-CM

## 2013-08-29 MED ORDER — LORATADINE 10 MG PO TABS: 10.0000 mg | ORAL_TABLET | Freq: Every day | ORAL | Status: DC

## 2013-08-29 MED ORDER — METHYLPREDNISOLONE ACETATE 40 MG/ML IJ SUSP
40.0000 mg | Freq: Once | INTRAMUSCULAR | Status: AC
  Administered 2013-08-29: 40 mg via INTRAMUSCULAR

## 2013-08-29 MED ORDER — FLUTICASONE PROPIONATE 50 MCG/ACT NA SUSP: 2.0000 | Freq: Every day | NASAL | Status: DC

## 2013-08-29 MED ORDER — AZITHROMYCIN 250 MG PO TABS: ORAL_TABLET | ORAL | Status: DC

## 2013-08-29 NOTE — Patient Instructions (Signed)
When using nasal spray: Head level or forward Light "bunny" sniff

## 2013-08-29 NOTE — Progress Notes (Signed)
Subjective:  Presents with complaints of pressure and occasional loud pulsating sensation in the right ear. Can last a few minutes to a few hours, occurs off and on about every day. Noticed it after her sinus symptoms began about 2 weeks ago. Frequent cough producing clear to yellow/brown mucus. Slight wheezing at times. Continues to smoke a half to one pack per day. No plans at the moment to quit smoking. No sore throat. Had a fever initially but this has resolved. Frontal area headache. Saw Dr. Benjamine Mola in January, a review of his notes shows mild hearing loss at high frequency, this was discussed with patient. See consultation note.  Objective:   BP 118/78  Temp(Src) 98.5 F (36.9 C) (Oral)  Ht 5' 4.25" (1.632 m)  Wt 137 lb (62.143 kg)  BMI 23.33 kg/m2 NAD. Alert, oriented. Both TMs are very retracted, no erythema. Pharynx mildly injected with PND noted. Neck supple with mild soft anterior adenopathy. Frequent congested cough noted. Lungs faint scattered expiratory crackles persistent after coughing. Heart regular rate rhythm. Carotids no bruits or thrills.  Assessment:Acute upper respiratory infections of unspecified site - Plan: methylPREDNISolone acetate (DEPO-MEDROL) injection 40 mg  Acute bronchitis  Hearing loss  Plan: Meds ordered this encounter  Medications  . DISCONTD: fluticasone (FLONASE) 50 MCG/ACT nasal spray    Sig:   . loratadine (CLARITIN) 10 MG tablet    Sig: Take 1 tablet (10 mg total) by mouth daily.    Dispense:  30 tablet    Refill:  11    Order Specific Question:  Supervising Provider    Answer:  Mikey Kirschner [[id number]]  . fluticasone (FLONASE) 50 MCG/ACT nasal spray    Sig: Place 2 sprays into both nostrils daily.    Dispense:  16 g    Refill:  11    Order Specific Question:  Supervising Provider    Answer:  Mikey Kirschner [[id number]]  . azithromycin (ZITHROMAX Z-PAK) 250 MG tablet    Sig: Take 2 tablets (500 mg) on  Day 1,  followed by 1 tablet (250 mg) once  daily on Days 2 through 5.    Dispense:  6 each    Refill:  0    Order Specific Question:  Supervising Provider    Answer:  Mikey Kirschner [[id number]]  . methylPREDNISolone acetate (DEPO-MEDROL) injection 40 mg    Sig:    Start Flonase daily, patient has been using it very sporadically. Call back in 7-10 days if no improvement in her symptoms, sooner if worse.

## 2013-12-10 ENCOUNTER — Other Ambulatory Visit: Payer: Self-pay | Admitting: Nurse Practitioner

## 2014-01-18 ENCOUNTER — Other Ambulatory Visit: Payer: Self-pay | Admitting: Obstetrics & Gynecology

## 2014-01-21 ENCOUNTER — Encounter: Payer: Self-pay | Admitting: Family Medicine

## 2014-01-21 ENCOUNTER — Ambulatory Visit (INDEPENDENT_AMBULATORY_CARE_PROVIDER_SITE_OTHER): Payer: Medicare Other | Admitting: Family Medicine

## 2014-01-21 VITALS — BP 132/82 | Temp 98.5°F | Ht 64.25 in | Wt 131.0 lb

## 2014-01-21 DIAGNOSIS — M545 Low back pain, unspecified: Secondary | ICD-10-CM

## 2014-01-21 LAB — POCT URINALYSIS DIPSTICK
SPEC GRAV UA: 1.02
pH, UA: 5

## 2014-01-21 MED ORDER — CHLORZOXAZONE 500 MG PO TABS: 500.0000 mg | ORAL_TABLET | Freq: Four times a day (QID) | ORAL | Status: DC | PRN

## 2014-01-21 MED ORDER — DICLOFENAC SODIUM 75 MG PO TBEC: 75.0000 mg | DELAYED_RELEASE_TABLET | Freq: Two times a day (BID) | ORAL | Status: DC

## 2014-01-21 NOTE — Progress Notes (Signed)
   Subjective:    Patient ID: Mackenzie Williams, female    DOB: 1971/01/23, 43 y.o.   MRN: [id number]  Back Pain This is a new problem. The current episode started in the past 7 days. The problem occurs constantly. The problem has been gradually worsening since onset. The pain is present in the thoracic spine. The pain does not radiate. The symptoms are aggravated by sitting and twisting. Associated symptoms include a fever (slight). Pertinent negatives include no abdominal pain, dysuria or headaches. She has tried NSAIDs for the symptoms. The treatment provided mild relief.   [place] about a week ago but the back paoin started 10 days ago.pain worse this weekend, started on thr right. Worse with certain movements     Review of Systems  Constitutional: Positive for fever (slight). Negative for activity change, appetite change and fatigue.  Respiratory: Negative for cough.   Gastrointestinal: Negative for nausea, vomiting, abdominal pain and diarrhea.  Genitourinary: Positive for flank pain (right side). Negative for dysuria.  Musculoskeletal: Positive for back pain.  Neurological: Negative for headaches.  Psychiatric/Behavioral: Negative for behavioral problems.       Objective:   Physical Exam  Vitals reviewed. Constitutional: She appears well-nourished. No distress.  Cardiovascular: Normal rate, regular rhythm and normal heart sounds.   No murmur heard. Pulmonary/Chest: Effort normal and breath sounds normal. No respiratory distress.  Musculoskeletal: She exhibits tenderness. She exhibits no edema.  Lymphadenopathy:    She has no cervical adenopathy.  Neurological: She is alert. She exhibits normal muscle tone.  Psychiatric: Her behavior is normal.          Assessment & Plan:  Patient with significant back pain and discomfort on the right side does not radiate down the legs. It is consistent with muscle strain I find no evidence of kidney involvement I do not find evidence of  any type of ligament or herniated disc. If she does not get better over the for the next couple weeks and let us know and we will pursue doing additional interventions

## 2014-01-22 ENCOUNTER — Telehealth: Payer: Self-pay | Admitting: Family Medicine

## 2014-01-22 NOTE — Telephone Encounter (Signed)
Patient was seen yesterday with backpain , she thinks its a pinched nerve in her back.She wanted to know if this changes her diagnosis and medication you gave her.

## 2014-01-22 NOTE — Telephone Encounter (Signed)
Left message return call

## 2014-01-22 NOTE — Telephone Encounter (Signed)
Discussed with patient. Patient verbalized understanding. 

## 2014-01-22 NOTE — Telephone Encounter (Signed)
No, she needs to use the medication that was prescribed anti-inflammatory will help a small as well as muscle relaxer very important for her to do stretching exercises if she is not improved over the next 2 weeks then call back to schedule a followup, sooner if worse

## 2014-01-23 ENCOUNTER — Ambulatory Visit: Payer: Medicare Other | Admitting: Advanced Practice Midwife

## 2014-01-23 ENCOUNTER — Ambulatory Visit: Payer: Medicare Other | Admitting: Obstetrics & Gynecology

## 2014-01-24 ENCOUNTER — Telehealth: Payer: Self-pay | Admitting: *Deleted

## 2014-01-24 NOTE — Telephone Encounter (Signed)
Left message to return call 

## 2014-01-24 NOTE — Telephone Encounter (Signed)
Pt called stating her insurance will not pay chlorzoxazon, pt wanted to know if she can get flexiril, pt is fine staying with chlorzoxazon it will be 15$ out of her pocket. Please advise [phone]

## 2014-01-24 NOTE — Telephone Encounter (Signed)
Typically Flexeril will cause too much dizziness. Can also cause significant drowsiness. If her pharmacist says other medicines would be covered I am willing to switch medicine if the pharmacist let us know what is covered under her insurance. That will be up to her.

## 2014-01-25 NOTE — Telephone Encounter (Signed)
Discussed with patient. Patient to check to see if Zanaflex is covered and if not she will fill the Parafon Forte.

## 2014-02-26 ENCOUNTER — Telehealth: Payer: Self-pay | Admitting: Family Medicine

## 2014-02-26 NOTE — Telephone Encounter (Signed)
Patient is going to have her regular cleaning done at [facility] and Dr. Tamala Julian wants to know if she needs to pre-medicate for this? He is requesting that we fax over a note to [phone] with instructions if she needs to.

## 2014-02-27 NOTE — Telephone Encounter (Signed)
Call pt I assume this is due to her previous orthopedic procedure and having a rod?

## 2014-02-28 NOTE — Telephone Encounter (Signed)
Mackenzie Williams at [facility] stated that it is due to her previous orthopedic procedure. The doctor needs a note stating she can be pre-medicated and what antibiotics you recommend she be prescribed.

## 2014-03-03 NOTE — Telephone Encounter (Signed)
The liklihood of infection is low but recommend 2 grams of amoxicillin taken 1 hour before procedure

## 2014-03-04 MED ORDER — AMOXICILLIN 500 MG PO CAPS: ORAL_CAPSULE | ORAL | Status: DC

## 2014-03-04 NOTE — Telephone Encounter (Signed)
Please write out what I typed earlier on letterhead thern Ill sign

## 2014-03-04 NOTE — Telephone Encounter (Signed)
Mackenzie Williams said they need an official note stating her need for premed before each cleaning and the exact medicine and regimen to follow. They need this faxed to [phone].

## 2014-03-04 NOTE — Telephone Encounter (Signed)
Rx sent electronically to pharmacy. Patient notified and letter faxed to dentist.

## 2014-03-08 ENCOUNTER — Other Ambulatory Visit: Payer: Self-pay | Admitting: Nurse Practitioner

## 2014-03-15 ENCOUNTER — Telehealth: Payer: Self-pay | Admitting: Family Medicine

## 2014-03-15 NOTE — Telephone Encounter (Signed)
Notified patient Yes. Zantac 150 once or twice per day or omeprazole 20 one daily. Patient verbalized understanding.

## 2014-03-15 NOTE — Telephone Encounter (Signed)
Patient says that the pantoprazole (PROTONIX) 40 MG tablet that she is prescribed makes her feel sick after taking it for a length of time.  She wants to know if she can stop this and start taking OTC antacid tablets?

## 2014-03-15 NOTE — Telephone Encounter (Signed)
Yes. Zantac 150 once or twice per d  Or omeprazole 20 one daily

## 2014-03-19 ENCOUNTER — Telehealth: Payer: Self-pay | Admitting: Family Medicine

## 2014-03-19 MED ORDER — OMEPRAZOLE 20 MG PO CPDR: 20.0000 mg | DELAYED_RELEASE_CAPSULE | Freq: Every day | ORAL | Status: DC

## 2014-03-19 NOTE — Telephone Encounter (Signed)
Medication sent to pharmacy. Patient was notified.  

## 2014-03-19 NOTE — Telephone Encounter (Signed)
Patient states that she needs a break from the protonix because if the negative effects and it makes her sick at times. She wants omeprazole prescribed. Is this ok?

## 2014-03-19 NOTE — Telephone Encounter (Signed)
Patient needs Rx for omeprazole.    [facility] [place]

## 2014-03-19 NOTE — Telephone Encounter (Signed)
Discontinue proton next period use omeprazole 20 mg, #30, 6 refills

## 2014-04-22 ENCOUNTER — Encounter: Payer: Self-pay | Admitting: Family Medicine

## 2014-04-23 ENCOUNTER — Encounter: Payer: Self-pay | Admitting: Family Medicine

## 2014-04-23 ENCOUNTER — Ambulatory Visit (INDEPENDENT_AMBULATORY_CARE_PROVIDER_SITE_OTHER): Payer: Medicare Other | Admitting: Family Medicine

## 2014-04-23 VITALS — BP 122/70 | Temp 98.5°F | Ht 64.25 in | Wt 131.0 lb

## 2014-04-23 DIAGNOSIS — J0100 Acute maxillary sinusitis, unspecified: Secondary | ICD-10-CM

## 2014-04-23 DIAGNOSIS — J3089 Other allergic rhinitis: Secondary | ICD-10-CM

## 2014-04-23 MED ORDER — AZITHROMYCIN 250 MG PO TABS: ORAL_TABLET | ORAL | Status: DC

## 2014-04-23 NOTE — Progress Notes (Signed)
   Subjective:    Patient ID: Mackenzie Williams, female    DOB: 02/17/1971, 43 y.o.   MRN: [id number]  Sinusitis This is a new problem. The current episode started 1 to 4 weeks ago. Associated symptoms include congestion, coughing, ear pain, headaches, shortness of breath, sinus pressure and swollen glands. (Chest pain, Scratchy throat)   Eyes burning and eye pain. Using clear eyes. 9 pt thinks it may be because of a herpes breakout bc this has happened before). Blurred vision at night. Started a while back.   PMH benign Review of Systems  Constitutional: Negative for fever and activity change.  HENT: Positive for congestion, ear pain, rhinorrhea and sinus pressure.   Eyes: Negative for discharge.  Respiratory: Positive for cough and shortness of breath. Negative for wheezing.   Cardiovascular: Negative for chest pain.  Neurological: Positive for headaches.       Objective:   Physical Exam  Constitutional: She appears well-developed.  HENT:  Head: Normocephalic.  Nose: Nose normal.  Mouth/Throat: Oropharynx is clear and moist. No oropharyngeal exudate.  Neck: Neck supple.  Cardiovascular: Normal rate and normal heart sounds.   No murmur heard. Pulmonary/Chest: Effort normal and breath sounds normal. She has no wheezes.  Lymphadenopathy:    She has no cervical adenopathy.  Skin: Skin is warm and dry.  Nursing note and vitals reviewed.         Assessment & Plan:  Viral syndrome secondary sinusitis antibiotics prescribed follow-up for ongoing troublesZithromax prescribed  Possible dry eyes recommend she see her eye doctor if she needs referral she will let us know I do not feel there is any signs of herpes.  Allergic rhinitis using OTC measures along with Flonase

## 2014-04-26 ENCOUNTER — Other Ambulatory Visit: Payer: Self-pay | Admitting: *Deleted

## 2014-04-26 ENCOUNTER — Telehealth: Payer: Self-pay | Admitting: Family Medicine

## 2014-04-26 MED ORDER — FLUCONAZOLE 150 MG PO TABS: ORAL_TABLET | ORAL | Status: DC

## 2014-04-26 MED ORDER — AMOXICILLIN-POT CLAVULANATE 875-125 MG PO TABS: 1.0000 | ORAL_TABLET | Freq: Two times a day (BID) | ORAL | Status: DC

## 2014-04-26 NOTE — Telephone Encounter (Signed)
[phone] Call this cell if no answer at home

## 2014-04-26 NOTE — Telephone Encounter (Signed)
In on 11/3. Still having a bad headache, sinus pressure, nasal congestion, ear pain, burning in face, not sure if she has a fever. Consult with dr. Richardson Landry. Change to augmentin 875mg  bid x 10 days.

## 2014-04-26 NOTE — Telephone Encounter (Signed)
Pt was put on a zpak, has two pills left an said that she was feeling better  Yesterday but today went back to feeling bad.   Just getting an opinion at this point she states. Does she need a different  Med? Face hurts, ears hurt,   [facility] [place]

## 2014-04-26 NOTE — Telephone Encounter (Signed)
Pt also wants diflucan for yeast infection. Diflucan 150mg  #2 one 3 days apart. Med sent to pharm. Pt notified.

## 2014-06-11 ENCOUNTER — Encounter: Payer: Self-pay | Admitting: Women's Health

## 2014-06-11 ENCOUNTER — Ambulatory Visit (INDEPENDENT_AMBULATORY_CARE_PROVIDER_SITE_OTHER): Payer: Medicare Other | Admitting: Women's Health

## 2014-06-11 ENCOUNTER — Ambulatory Visit: Payer: Medicare Other | Admitting: Obstetrics & Gynecology

## 2014-06-11 VITALS — BP 136/64 | Ht 64.25 in | Wt 129.0 lb

## 2014-06-11 DIAGNOSIS — N760 Acute vaginitis: Secondary | ICD-10-CM

## 2014-06-11 DIAGNOSIS — B9689 Other specified bacterial agents as the cause of diseases classified elsewhere: Secondary | ICD-10-CM | POA: Insufficient documentation

## 2014-06-11 DIAGNOSIS — Z8619 Personal history of other infectious and parasitic diseases: Secondary | ICD-10-CM | POA: Insufficient documentation

## 2014-06-11 DIAGNOSIS — N898 Other specified noninflammatory disorders of vagina: Secondary | ICD-10-CM

## 2014-06-11 DIAGNOSIS — A499 Bacterial infection, unspecified: Secondary | ICD-10-CM

## 2014-06-11 LAB — POCT WET PREP (WET MOUNT): Clue Cells Wet Prep Whiff POC: POSITIVE

## 2014-06-11 MED ORDER — METRONIDAZOLE 500 MG PO TABS: 500.0000 mg | ORAL_TABLET | Freq: Two times a day (BID) | ORAL | Status: DC

## 2014-06-11 NOTE — Patient Instructions (Signed)
Bacterial Vaginosis Bacterial vaginosis is a vaginal infection that occurs when the normal balance of bacteria in the vagina is disrupted. It results from an overgrowth of certain bacteria. This is the most common vaginal infection in women of childbearing age. Treatment is important to prevent complications, especially in pregnant women, as it can cause a premature delivery. CAUSES  Bacterial vaginosis is caused by an increase in harmful bacteria that are normally present in smaller amounts in the vagina. Several different kinds of bacteria can cause bacterial vaginosis. However, the reason that the condition develops is not fully understood. RISK FACTORS Certain activities or behaviors can put you at an increased risk of developing bacterial vaginosis, including:  Having a new sex partner or multiple sex partners.  Douching.  Using an intrauterine device (IUD) for contraception. Women do not get bacterial vaginosis from toilet seats, bedding, swimming pools, or contact with objects around them. SIGNS AND SYMPTOMS  Some women with bacterial vaginosis have no signs or symptoms. Common symptoms include:  Grey vaginal discharge.  A fishlike odor with discharge, especially after sexual intercourse.  Itching or burning of the vagina and vulva.  Burning or pain with urination. DIAGNOSIS  Your health care provider will take a medical history and examine the vagina for signs of bacterial vaginosis. A sample of vaginal fluid may be taken. Your health care provider will look at this sample under a microscope to check for bacteria and abnormal cells. A vaginal pH test may also be done.  TREATMENT  Bacterial vaginosis may be treated with antibiotic medicines. These may be given in the form of a pill or a vaginal cream. A second round of antibiotics may be prescribed if the condition comes back after treatment.  HOME CARE INSTRUCTIONS   Only take over-the-counter or prescription medicines as  directed by your health care provider.  If antibiotic medicine was prescribed, take it as directed. Make sure you finish it even if you start to feel better.  Do not have sex until treatment is completed.  Tell all sexual partners that you have a vaginal infection. They should see their health care provider and be treated if they have problems, such as a mild rash or itching.  Practice safe sex by using condoms and only having one sex partner. SEEK MEDICAL CARE IF:   Your symptoms are not improving after 3 days of treatment.  You have increased discharge or pain.  You have a fever. MAKE SURE YOU:   Understand these instructions.  Will watch your condition.  Will get help right away if you are not doing well or get worse. FOR MORE INFORMATION  Centers for Disease Control and Prevention, Division of STD Prevention: www.cdc.gov/std American Sexual Health Association (ASHA): www.ashastd.org  Document Released: 06/07/2005 Document Revised: 03/28/2013 Document Reviewed: 01/17/2013 ExitCare Patient Information 2015 [employer]. This information is not intended to replace advice given to you by your health care provider. Make sure you discuss any questions you have with your health care provider.  

## 2014-06-11 NOTE — Progress Notes (Signed)
Patient ID: Mackenzie Williams, female   DOB: 23-May-1971, 43 y.o.   MRN: [id number]   [facility] Visit  Patient name: Mackenzie Williams MRN [id number]  Date of birth: 03-17-1971  CC & HPI:  Mackenzie Williams is a 43 y.o. Caucasian female presenting today for report of vulvovaginal irritation/burning/stinging x 3-4 days w/ malodorous d/c. H/O HSV on valtrex suppression, does not feel like outbreak to her.   Pertinent History Reviewed:  Medical & Surgical Hx:   Past Medical History  Diagnosis Date  . COPD (chronic obstructive pulmonary disease)   . Asthma   . Genital herpes   . Neck pain   . MRSA carrier   . Menorrhagia     [facility]  . H. pylori infection 11/2011  . Varicose veins   . Leg pain, right   . Allergy    Past Surgical History  Procedure Laterality Date  . Uterine septum resection  2011  . Uterine fibroid surgery  2011  . Fibula fracture surgery  2010  . Ankle surgery  1999  . Cholecystectomy  04/07/2011    Dr Elenor Quinones dyskinesia  . Esophagogastroduodenoscopy  03/02/2012    Procedure: ESOPHAGOGASTRODUODENOSCOPY (EGD);  Surgeon: Daneil Dolin, MD;  Location: [facility];  Service: Endoscopy;  Laterality: N/A;  7:30/GIVE PHENERGAN 25MG  IV 30 MINS PRIOR TO PROCEDURE   Medications: Reviewed & Updated - see associated section Social History: Reviewed -  reports that she has been smoking Cigarettes.  She has a 5 pack-year smoking history. She has never used smokeless tobacco.  Objective Findings:  Vitals: BP 136/64 mmHg  Ht 5' 4.25" (1.632 m)  Wt 129 lb (58.514 kg)  BMI 21.97 kg/m2  LMP 05/28/2014  Physical Examination: General appearance - alert, well appearing, and in no distress Pelvic - normal external genitalia, cx appears normal, small amount thin white malodorous d/c  Results for orders placed or performed in visit on 06/11/14 (from the past 24 hour(s))  POCT Wet Prep [facility] [place])   Collection Time: 06/11/14  4:09 PM  Result Value Ref Range   Source Wet Prep POC vaginal    WBC, Wet Prep HPF POC none    Bacteria Wet Prep HPF POC none    BACTERIA WET PREP MORPHOLOGY POC     Clue Cells Wet Prep HPF POC Many    Clue Cells Wet Prep Whiff POC Positive Whiff    Yeast Wet Prep HPF POC None    KOH Wet Prep POC     Trichomonas Wet Prep HPF POC none      Assessment & Plan:  A:   BV P:  Rx flagyl 500mg  bid x 7d, no sex or etoh during tx  F/U prn  Pap due Sept 2016   Tawnya Crook CNM, [facility] 06/11/2014 4:10 PM

## 2014-06-24 ENCOUNTER — Telehealth: Payer: Self-pay | Admitting: Family Medicine

## 2014-06-24 MED ORDER — OMEPRAZOLE 20 MG PO CPDR: 20.0000 mg | DELAYED_RELEASE_CAPSULE | Freq: Every day | ORAL | Status: DC

## 2014-06-24 NOTE — Telephone Encounter (Signed)
omeprazole (PRILOSEC) 20 MG capsule   Pt was out of town and accidentally left her bottle at the  [place] [facility]. She called back to see if she could get it but it was already  Gone.   Can she get a refill on this?

## 2014-06-24 NOTE — Telephone Encounter (Signed)
Rx sent electronically to pharmacy. Patient notified. 

## 2014-07-10 ENCOUNTER — Encounter: Payer: Self-pay | Admitting: Advanced Practice Midwife

## 2014-07-10 ENCOUNTER — Ambulatory Visit (INDEPENDENT_AMBULATORY_CARE_PROVIDER_SITE_OTHER): Payer: Medicare Other | Admitting: Advanced Practice Midwife

## 2014-07-10 VITALS — BP 110/80 | Wt 128.0 lb

## 2014-07-10 DIAGNOSIS — J029 Acute pharyngitis, unspecified: Secondary | ICD-10-CM | POA: Diagnosis not present

## 2014-07-10 DIAGNOSIS — N94819 Vulvodynia, unspecified: Secondary | ICD-10-CM

## 2014-07-10 NOTE — Progress Notes (Signed)
[facility] Visit  Patient name: Mackenzie Williams MRN [id number]  Date of birth: 1970/09/13  CC & HPI:  Mackenzie Williams is a 44 y.o. Caucasian female presenting today for C /O vaginal irritaion.  She was treated for + BV (confirmed with exam) a few weeks ago, felt better, and now sees "mucus" and "cottage cheese" discharge. Also c/o sore throat for 2 days  Pertinent History Reviewed:  Medical & Surgical Hx:   Past Medical History  Diagnosis Date  . COPD (chronic obstructive pulmonary disease)   . Asthma   . Genital herpes   . Neck pain   . MRSA carrier   . Menorrhagia     [facility]  . H. pylori infection 11/2011  . Varicose veins   . Leg pain, right   . Allergy    Past Surgical History  Procedure Laterality Date  . Uterine septum resection  2011  . Uterine fibroid surgery  2011  . Fibula fracture surgery  2010  . Ankle surgery  1999  . Cholecystectomy  04/07/2011    Dr Elenor Quinones dyskinesia  . Esophagogastroduodenoscopy  03/02/2012    Procedure: ESOPHAGOGASTRODUODENOSCOPY (EGD);  Surgeon: Daneil Dolin, MD;  Location: [facility];  Service: Endoscopy;  Laterality: N/A;  7:30/GIVE PHENERGAN 25MG  IV 30 MINS PRIOR TO PROCEDURE    Current outpatient prescriptions:  .  amoxicillin-clavulanate (AUGMENTIN) 875-125 MG per tablet, Take 1 tablet by mouth 2 (two) times daily. (Patient not taking: Reported on 06/11/2014), Disp: 20 tablet, Rfl: 0 .  azithromycin (ZITHROMAX Z-PAK) 250 MG tablet, Take 2 tablets (500 mg) on  Day 1,  followed by 1 tablet (250 mg) once daily on Days 2 through 5. (Patient not taking: Reported on 06/11/2014), Disp: 6 each, Rfl: 0 .  diclofenac (VOLTAREN) 75 MG EC tablet, Take 1 tablet (75 mg total) by mouth 2 (two) times daily. (Patient not taking: Reported on 06/11/2014), Disp: 30 tablet, Rfl: 1 .  fluconazole (DIFLUCAN) 150 MG tablet, Take one tablet 3 days apart (Patient not taking: Reported on 06/11/2014), Disp: 2 tablet, Rfl: 0 .   fluticasone (FLONASE) 50 MCG/ACT nasal spray, Place 2 sprays into both nostrils daily., Disp: 16 g, Rfl: 11 .  ibuprofen (ADVIL,MOTRIN) 200 MG tablet, Take 200 mg by mouth every 6 (six) hours as needed., Disp: , Rfl:  .  loratadine (CLARITIN) 10 MG tablet, Take 1 tablet (10 mg total) by mouth daily. (Patient not taking: Reported on 06/11/2014), Disp: 30 tablet, Rfl: 11 .  metroNIDAZOLE (FLAGYL) 500 MG tablet, Take 1 tablet (500 mg total) by mouth 2 (two) times daily. X 7 days, Disp: 14 tablet, Rfl: 0 .  omeprazole (PRILOSEC) 20 MG capsule, Take 1 capsule (20 mg total) by mouth daily., Disp: 30 capsule, Rfl: 6 .  triamcinolone (NASACORT) 55 MCG/ACT AERO nasal inhaler, USE TWO SPRAY(S) IN EACH NOSTRIL ONCE DAILY AS NEEDED FOR  HEAD  CONGESTION, Disp: 1 Bottle, Rfl: 5 .  valACYclovir (VALTREX) 500 MG tablet, TAKE ONE TABLET BY MOUTH ONCE DAILY, Disp: 30 tablet, Rfl: 11 Social History: Reviewed -  reports that she has been smoking Cigarettes.  She has a 5 pack-year smoking history. She has never used smokeless tobacco.  Objective Findings:  Vitals: LMP 05/28/2014  Physical Examination: General appearance - alert, well appearing, and in no distress Mental status - alert, oriented to person, place, and time Ears - bilateral TM's and external ear canals normal Nose - normal and patent, no erythema, discharge  or polyps Mouth - erythematous and throat culture obtained Pelvic - normal external genitalia, vulva, vagina,cervix. Dishcarge appears normal without odor. Wet prep completely normal  No results found for this or any previous visit (from the past 24 hour(s)).   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  P:  Throat culture  Samples of rePhresh given   CRESENZO-DISHMAN,Zeric Baranowski CNM 07/10/2014 1:59 PM

## 2014-07-11 LAB — STREP A DNA PROBE: GASP: NEGATIVE

## 2014-07-30 DIAGNOSIS — M129 Arthropathy, unspecified: Secondary | ICD-10-CM | POA: Diagnosis not present

## 2014-09-09 ENCOUNTER — Encounter: Payer: Self-pay | Admitting: Adult Health

## 2014-09-09 ENCOUNTER — Ambulatory Visit (INDEPENDENT_AMBULATORY_CARE_PROVIDER_SITE_OTHER): Payer: Medicare Other | Admitting: Adult Health

## 2014-09-09 VITALS — BP 120/70 | HR 68 | Ht 64.25 in | Wt 129.5 lb

## 2014-09-09 DIAGNOSIS — K30 Functional dyspepsia: Secondary | ICD-10-CM

## 2014-09-09 DIAGNOSIS — R109 Unspecified abdominal pain: Secondary | ICD-10-CM

## 2014-09-09 NOTE — Progress Notes (Addendum)
Subjective:     Patient ID: Mackenzie Williams, female   DOB: [birth year], 44 y.o.   MRN: [id number]  HPI Mackenzie Williams is a  44 year old white female in thinking she may have retained tampon and stomach hurts, has discharge at times.She was a work in.  Review of Systems +stomach pain + vaginal discharge at times, all other systems negative Reviewed past medical,surgical, social and family history. Reviewed medications and allergies.     Objective:   Physical Exam BP 120/70 mmHg  Pulse 68  Ht 5' 4.25" (1.632 m)  Wt 129 lb 8 oz (58.741 kg)  BMI 22.05 kg/m2  LMP 08/31/2014 Skin warm and dry.Pelvic: external genitalia is normal in appearance no lesions, vagina: scant discharge without odor,urethra has no lesions or masses noted, cervix:smooth and bulbous, uterus: normal size, shape and contour, non tender, no masses felt, adnexa: no masses or tenderness noted. Bladder is non tender and no masses felt.     Assessment:     Stomach pain    Plan:     Pap and physical in 6 months

## 2014-09-09 NOTE — Patient Instructions (Signed)
Pap and physical in 6 months

## 2014-09-10 ENCOUNTER — Telehealth: Payer: Self-pay | Admitting: *Deleted

## 2014-09-10 ENCOUNTER — Encounter: Payer: Self-pay | Admitting: *Deleted

## 2014-09-10 ENCOUNTER — Telehealth: Payer: Self-pay | Admitting: Family Medicine

## 2014-09-10 NOTE — Telephone Encounter (Signed)
Notified patient needs to redirect call back to [facility], they are the experts. Patient verbalized understanding.

## 2014-09-10 NOTE — Telephone Encounter (Signed)
Patient was seen at [facility] yesterday for this issue.

## 2014-09-10 NOTE — Telephone Encounter (Signed)
Let message to call in am

## 2014-09-10 NOTE — Telephone Encounter (Signed)
Pt needs to redirect call back to [facility], they are the experts

## 2014-09-10 NOTE — Telephone Encounter (Signed)
Left message to call in am  

## 2014-09-10 NOTE — Telephone Encounter (Signed)
Pt called stating that it hurts during intercourse and states that she has previously  Left a tampon in and forgot about it. Pt may think that this may have happened again. [facility] and [facility] didn't see anything. Pt states that she is having pain On right side under her ribs and during sex when her partner enters. Pt wants the nurse to call her regarding this.

## 2014-09-10 NOTE — Telephone Encounter (Signed)
Spoke with pt. Pt is having pain in right side of stomach. Pt thinks pain is worse today. What do you advise? Thanks!! [place]

## 2014-09-12 ENCOUNTER — Other Ambulatory Visit: Payer: Self-pay | Admitting: Nurse Practitioner

## 2014-09-20 ENCOUNTER — Encounter: Payer: Self-pay | Admitting: Obstetrics & Gynecology

## 2014-09-20 ENCOUNTER — Ambulatory Visit (INDEPENDENT_AMBULATORY_CARE_PROVIDER_SITE_OTHER): Payer: Medicare Other | Admitting: Obstetrics & Gynecology

## 2014-09-20 VITALS — BP 122/64 | HR 80 | Ht 64.0 in | Wt 128.0 lb

## 2014-09-20 DIAGNOSIS — A499 Bacterial infection, unspecified: Secondary | ICD-10-CM | POA: Diagnosis not present

## 2014-09-20 DIAGNOSIS — N760 Acute vaginitis: Secondary | ICD-10-CM

## 2014-09-20 DIAGNOSIS — IMO0002 Reserved for concepts with insufficient information to code with codable children: Secondary | ICD-10-CM

## 2014-09-20 DIAGNOSIS — B9689 Other specified bacterial agents as the cause of diseases classified elsewhere: Secondary | ICD-10-CM

## 2014-09-20 DIAGNOSIS — N941 Dyspareunia: Secondary | ICD-10-CM

## 2014-09-20 NOTE — Progress Notes (Signed)
Patient ID: Mackenzie Williams, female   DOB: Feb 24, 1971, 44 y.o.   MRN: [id number]   Chief Complaint  Patient presents with  . vaginal burning and itching, abdominal pain extends to right     HPI:    44 y.o. G2P0020 Patient's last menstrual period was 08/31/2014.  Pt has been having increasing difficulty with vaginal discomfort with intercourse related to lack of lubrication Some bothersome discharge no odor Location:  vaginal. Quality:  mild. Severity:  mild. Timing:  . Duration:  Few months. Context:  Had a retained tampon. Modifying factors:   Signs/Symptoms:      Current outpatient prescriptions:  .  fluticasone (FLONASE) 50 MCG/ACT nasal spray, USE TWO SPRAY(S) IN EACH NOSTRIL ONCE DAILY, Disp: 16 g, Rfl: 5 .  ibuprofen (ADVIL,MOTRIN) 200 MG tablet, Take 200 mg by mouth every 6 (six) hours as needed., Disp: , Rfl:  .  omeprazole (PRILOSEC) 20 MG capsule, Take 20 mg by mouth daily. , Disp: , Rfl:  .  oxyCODONE-acetaminophen (PERCOCET/ROXICET) 5-325 MG per tablet, Take 1 tablet by mouth as needed for severe pain., Disp: , Rfl:  .  valACYclovir (VALTREX) 500 MG tablet, TAKE ONE TABLET BY MOUTH ONCE DAILY, Disp: 30 tablet, Rfl: 11  Problem Pertinent ROS:        No burning with urination, frequency or urgency No nausea, vomiting or diarrhea Nor fever chills or other constitutional symptoms    Extended ROS:        [place]:             Past Medical History  Diagnosis Date  . COPD (chronic obstructive pulmonary disease)   . Asthma   . Genital herpes   . Neck pain   . MRSA carrier   . Menorrhagia     [facility]  . H. pylori infection 11/2011  . Varicose veins   . Leg pain, right   . Allergy     Past Surgical History  Procedure Laterality Date  . Uterine septum resection  2011  . Uterine fibroid surgery  2011  . Fibula fracture surgery  2010  . Ankle surgery  1999  . Cholecystectomy  04/07/2011    Dr Elenor Quinones dyskinesia  . Esophagogastroduodenoscopy   03/02/2012    Procedure: ESOPHAGOGASTRODUODENOSCOPY (EGD);  Surgeon: Daneil Dolin, MD;  Location: [facility];  Service: Endoscopy;  Laterality: N/A;  7:30/GIVE PHENERGAN 25MG  IV 30 MINS PRIOR TO PROCEDURE    OB History    Gravida Para Term Preterm AB TAB SAB Ectopic Multiple Living   2    2  2          Allergies  Allergen Reactions  . Codeine     Pt almost passed out when she took this at a younger age.    History   Social History  . Marital Status: Divorced    Spouse Name: N/A  . Number of Children: 0  . Years of Education: N/A   Occupational History  . disabled    Social History Main Topics  . Smoking status: Current Every Day Smoker -- 0.50 packs/day for 10 years    Types: Cigarettes  . Smokeless tobacco: Never Used  . Alcohol Use: No  . Drug Use: 1.00 per week    Special: Marijuana, Oxycodone     Comment: once a week  . Sexual Activity: Yes    Birth Control/ Protection: Other-see comments, None, Condom     Comment: pull-out   Other Topics Concern  . None  Social History Narrative    Family History  Problem Relation Age of Onset  . Anesthesia problems Neg Hx   . Hypotension Neg Hx   . Malignant hyperthermia Neg Hx   . Pseudochol deficiency Neg Hx   . Hypertension Mother   . Hypertension Father   . Heart disease Father     heart attack  . Stroke Father      Examination:  Vitals:  Blood pressure 122/64, pulse 80, height 5\' 4"  (1.626 m), weight 128 lb (58.06 kg), last menstrual period 08/31/2014.    Physical Examination:     General Appearance:  well developed, well nourished and in no acute distress  Vulva:  normal appearing vulva with no masses, tenderness or lesions Vagina:  normal mucosa, minimal discharge Cervix:  no cervical motion tenderness and no lesions Uterus:  normal size, contour, position, consistency, mobility, non-tender Adnexa: ovaries:present,  normal adnexa in size, nontender and no masses  Wet Prep:   A sample of  vaginal discharge was obtained from the posterior fornix using a cotton swab. 2 drops of saline were placed on a slide and the cotton swab was immersed in the saline. Microscopic evaluation was performed and results were as follows:  Negative  for yeast  Negative for clue cells , consistent with Bacterial vaginosis Negative for trichomonas  Normal WBC population   Whiff test: Negative   DATA orders and reviews: Labs were ordered today:  Wet prep Imaging studies were not ordered today:    Lab tests were not reviewed today:    Imaging studies were not reviewed today:    I did not independently review/view images, tracing or specimen(not simply the report) myself.  Prescription Drug Management:  New Prescriptions: [place] warming, evaluate for vaginal estrogen next visit Renewed Prescriptions:   Current prescription changes:     Impression/Plan(Problem Based): 1.  Dyspareunia      (new problem) : Additional workup is not needed:  Related to lack of lubrication secondarily to declining vaginal estrogen, will try [place] warming and follow up in 6 weeks, may use vaginal estrogen thereafter  {2.  Vaginal discharge      (follow up of a pre-existing problem:) : Additional workup is not needed:       Follow Up:   6  weeks

## 2014-10-31 ENCOUNTER — Ambulatory Visit: Payer: Medicare Other | Admitting: Family Medicine

## 2014-11-01 ENCOUNTER — Ambulatory Visit (INDEPENDENT_AMBULATORY_CARE_PROVIDER_SITE_OTHER): Payer: Medicare Other | Admitting: Obstetrics & Gynecology

## 2014-11-01 ENCOUNTER — Encounter: Payer: Self-pay | Admitting: Obstetrics & Gynecology

## 2014-11-01 VITALS — BP 130/90 | HR 68 | Wt 129.0 lb

## 2014-11-01 DIAGNOSIS — N952 Postmenopausal atrophic vaginitis: Secondary | ICD-10-CM | POA: Diagnosis not present

## 2014-11-01 DIAGNOSIS — N94819 Vulvodynia, unspecified: Secondary | ICD-10-CM | POA: Diagnosis not present

## 2014-11-01 MED ORDER — ESTRADIOL 0.1 MG/GM VA CREA: 1.0000 | TOPICAL_CREAM | Freq: Every day | VAGINAL | Status: DC

## 2014-11-01 NOTE — Progress Notes (Signed)
Patient ID: Mackenzie Williams, female   DOB: 09/18/1970, 44 y.o.   MRN: [id number] Chief Complaint  Patient presents with  . Follow-up    c/c still feeling burning/ string in vaginal area.   Blood pressure 130/90, pulse 68, weight 129 lb (58.514 kg), last menstrual period 08/31/2014.  Patient had a negative reaction to the [facility] she thinks it may have actually had a allergic reaction to it She states she has noticed since she's been drying but with direct clitoral stimulation is able to eventually have an orgasm and to obtain moisture She states her sexual response is somewhat sluggish as compared to previous experience but it does still exist She has not noticed any odor She really hasn't engaged in sexual intercourse with her partner  1 her to come back in today for reevaluation of the heavy white blood cell infiltrate and a wet prep is obtained with that today  Exam Vulva normal external female genitalia Vagina pink little atrophic no discharge seen today as before Cuff intact   Wet Prep:   A sample of vaginal discharge was obtained from the posterior fornix using a cotton swab. 2 drops of saline were placed on a slide and the cotton swab was immersed in the saline. Microscopic evaluation was performed and results were as follows:  Negative  for yeast  Negative for clue cells , consistent with Bacterial vaginosis Negative for trichomonas  Normal WBC population   Whiff test: Negative  Perimenopausal atrophic changes, resolution of the heavy white blood cell infiltrate on the doxycycline  Estrace vaginal cream qhs Follow up 6 weeks to reevaluate her symptoms and vaginal health Hopefully this is going to resolve her low-grade irritation and burning symptoms

## 2014-11-20 ENCOUNTER — Telehealth: Payer: Self-pay | Admitting: Family Medicine

## 2014-11-20 NOTE — Telephone Encounter (Signed)
Pt states she had a small tick on her that she got out with some alcohol.  She was told by family that if she didn't get treatment for the bite within  48hrs that she would be in danger of lyme's disease. She states the only  Symptom she is having at this time is the spot is red an swollen.   She has an appt with Hoyle Sauer already Friday afternoon. Do you think she needs  to be seen before then?

## 2014-11-20 NOTE — Telephone Encounter (Signed)
An appointment on Friday is perfectly fine. If it still looks inflamed we can easily start antibody at that time. Treating with demands of bionic at the time of the initial tick bite is only recommended in areas where Lyme's disease is epidemic. That is not this area.

## 2014-11-20 NOTE — Telephone Encounter (Addendum)
Discussed with patient. Patient advised An appointment on Friday is perfectly fine. If it still looks inflamed we can easily start antibody at that time. Treating with demands of antibiotic at the time of the initial tick bite is only recommended in areas where Lyme's disease is epidemic. That is not this area. Warning signs discussed with patient. Patient verbalized understanding and will keep office visit on Friday.

## 2014-11-22 ENCOUNTER — Ambulatory Visit (INDEPENDENT_AMBULATORY_CARE_PROVIDER_SITE_OTHER): Payer: Medicare Other | Admitting: Nurse Practitioner

## 2014-11-22 ENCOUNTER — Encounter: Payer: Self-pay | Admitting: Nurse Practitioner

## 2014-11-22 VITALS — BP 136/82 | Temp 98.0°F | Ht 64.25 in | Wt 134.0 lb

## 2014-11-22 DIAGNOSIS — R1011 Right upper quadrant pain: Secondary | ICD-10-CM

## 2014-11-22 DIAGNOSIS — K2950 Unspecified chronic gastritis without bleeding: Secondary | ICD-10-CM | POA: Diagnosis not present

## 2014-11-22 DIAGNOSIS — K589 Irritable bowel syndrome without diarrhea: Secondary | ICD-10-CM | POA: Diagnosis not present

## 2014-11-22 NOTE — Patient Instructions (Signed)

## 2014-11-23 LAB — HEPATIC FUNCTION PANEL
ALT: 19 IU/L (ref 0–32)
AST: 24 IU/L (ref 0–40)
Albumin: 4.2 g/dL (ref 3.5–5.5)
Alkaline Phosphatase: 70 IU/L (ref 39–117)
BILIRUBIN TOTAL: 0.9 mg/dL (ref 0.0–1.2)
Bilirubin, Direct: 0.25 mg/dL (ref 0.00–0.40)
TOTAL PROTEIN: 6.8 g/dL (ref 6.0–8.5)

## 2014-11-23 LAB — BASIC METABOLIC PANEL
BUN / CREAT RATIO: 19 (ref 9–23)
BUN: 12 mg/dL (ref 6–24)
CHLORIDE: 101 mmol/L (ref 97–108)
CO2: 25 mmol/L (ref 18–29)
Calcium: 9.7 mg/dL (ref 8.7–10.2)
Creatinine, Ser: 0.64 mg/dL (ref 0.57–1.00)
GFR calc Af Amer: 124 mL/min/{1.73_m2} (ref >59)
GFR calc non Af Amer: 107 mL/min/{1.73_m2} (ref >59)
GLUCOSE: 79 mg/dL (ref 65–99)
POTASSIUM: 4.5 mmol/L (ref 3.5–5.2)
Sodium: 141 mmol/L (ref 134–144)

## 2014-11-23 LAB — CBC WITH DIFFERENTIAL/PLATELET
BASOS: 0 %
Basophils Absolute: 0 10*3/uL (ref 0.0–0.2)
EOS (ABSOLUTE): 0.4 10*3/uL (ref 0.0–0.4)
EOS: 4 %
HEMATOCRIT: 42.9 % (ref 34.0–46.6)
Hemoglobin: 14.1 g/dL (ref 11.1–15.9)
IMMATURE GRANS (ABS): 0 10*3/uL (ref 0.0–0.1)
Immature Granulocytes: 0 %
Lymphocytes Absolute: 3.2 10*3/uL — ABNORMAL HIGH (ref 0.7–3.1)
Lymphs: 39 %
MCH: 31.6 pg (ref 26.6–33.0)
MCHC: 32.9 g/dL (ref 31.5–35.7)
MCV: 96 fL (ref 79–97)
MONOS ABS: 0.8 10*3/uL (ref 0.1–0.9)
Monocytes: 10 %
Neutrophils Absolute: 3.8 10*3/uL (ref 1.4–7.0)
Neutrophils: 47 %
Platelets: 265 10*3/uL (ref 150–379)
RBC: 4.46 x10E6/uL (ref 3.77–5.28)
RDW: 14.2 % (ref 12.3–15.4)
WBC: 8.2 10*3/uL (ref 3.4–10.8)

## 2014-11-23 LAB — LIPASE: Lipase: 39 U/L (ref 0–59)

## 2014-11-25 ENCOUNTER — Encounter: Payer: Self-pay | Admitting: Nurse Practitioner

## 2014-11-25 NOTE — Progress Notes (Signed)
Subjective:   Presents for complaints of right upper quadrant and epigastric area abdominal pain that began a week ago. Describes as  A burning/uncomfortable feeling. Feels like something is stuck at times in the mid chest area. No nausea vomiting. Occasionally will radiate across the upper abdominal area. Unassociated with meals or particular foods. History of gastritis. Has seen GI in the past. Taking daily omeprazole. Drinks a large amount of caffeine. Smokes less than one pack per day. Having acid reflux symptoms a few days a week while on omeprazole. Had one episode of black stool yesterday , otherwise normal color. No obvious blood. Having alternating cycles of constipation and diarrhea, history of IBS. No urinary symptoms. No fever. Her last EGD was done 03/02/2012.  Objective:   BP 136/82 mmHg  Temp(Src) 98 F (36.7 C) (Oral)  Ht 5' 4.25" (1.632 m)  Wt 134 lb (60.782 kg)  BMI 22.82 kg/m2  LMP 08/31/2014  NAD. Alert, oriented. Mildly anxious affect. Lungs clear. Heart regular rate rhythm. Abdomen soft nondistended with active bowel sounds 4; distinct epigastric area tenderness. Minimal right upper quadrant discomfort on exam. No obvious masses or organomegaly.  Assessment: Gastritis, chronic - Plan: Ambulatory referral to Gastroenterology, Basic metabolic panel, Lipase  IBS (irritable bowel syndrome) - Plan: Ambulatory referral to Gastroenterology, CBC with Differential/Platelet, Basic metabolic panel  Right upper quadrant pain - Plan: Ambulatory referral to Gastroenterology, CBC with Differential/Platelet, Hepatic function panel, Basic metabolic panel  Plan:  Labs pending. Referral to GI specialist for recheck. Continue omeprazole as directed. Decrease caffeine in her diet. Decrease or stop smoking. Reviewed dietary measures. Warning signs reviewed. Call back in the meantime if symptoms worsen.

## 2014-11-26 ENCOUNTER — Telehealth: Payer: Self-pay | Admitting: Nurse Practitioner

## 2014-11-26 NOTE — Telephone Encounter (Signed)
Discussed lab results with pt. See labs. Pt notified carolyn will be back tomorrow to see message.

## 2014-11-26 NOTE — Telephone Encounter (Signed)
Pt states that her stools are no longer black, they are now a more  Of a normal color at this point.   She would also like to know the results of her BW done last week

## 2014-11-27 ENCOUNTER — Encounter: Payer: Self-pay | Admitting: General Practice

## 2014-11-27 NOTE — Telephone Encounter (Signed)
LMRC

## 2014-11-27 NOTE — Telephone Encounter (Signed)
Discussed with pt

## 2014-11-27 NOTE — Telephone Encounter (Signed)
That is great that her stools are back to normal but still recommend GI consult. The black stools could be from bleeding in the GI system since she denies taking any meds that could change the color of her stool.

## 2014-11-28 ENCOUNTER — Ambulatory Visit: Payer: Medicare Other | Admitting: Family Medicine

## 2014-12-13 ENCOUNTER — Ambulatory Visit: Payer: Medicare Other | Admitting: Obstetrics & Gynecology

## 2014-12-18 ENCOUNTER — Other Ambulatory Visit: Payer: Self-pay

## 2014-12-18 ENCOUNTER — Encounter: Payer: Self-pay | Admitting: Gastroenterology

## 2014-12-18 ENCOUNTER — Ambulatory Visit (INDEPENDENT_AMBULATORY_CARE_PROVIDER_SITE_OTHER): Payer: Medicare Other | Admitting: Gastroenterology

## 2014-12-18 VITALS — BP 105/64 | HR 75 | Temp 98.0°F | Ht 64.0 in | Wt 134.8 lb

## 2014-12-18 DIAGNOSIS — R197 Diarrhea, unspecified: Secondary | ICD-10-CM

## 2014-12-18 DIAGNOSIS — R1010 Upper abdominal pain, unspecified: Secondary | ICD-10-CM | POA: Diagnosis not present

## 2014-12-18 DIAGNOSIS — K921 Melena: Secondary | ICD-10-CM | POA: Insufficient documentation

## 2014-12-18 DIAGNOSIS — R1030 Lower abdominal pain, unspecified: Secondary | ICD-10-CM | POA: Insufficient documentation

## 2014-12-18 DIAGNOSIS — K529 Noninfective gastroenteritis and colitis, unspecified: Secondary | ICD-10-CM

## 2014-12-18 MED ORDER — PEG 3350-KCL-NA BICARB-NACL 420 G PO SOLR: 4000.0000 mL | ORAL | Status: DC

## 2014-12-18 NOTE — Progress Notes (Signed)
Primary Care Physician:  Sallee Lange, MD  Primary Gastroenterologist:  Garfield Cornea, MD   Chief Complaint  Patient presents with  . Irritable Bowel Syndrome  . Abdominal Pain    HPI:  Mackenzie Williams is a 44 y.o. female here for further evaluation of melena, upper and lower abdominal pain. States she's had a lot of abdominal discomfort for the past several months. Went 3 months without her menstrual cycle. Wasn't sure if abdominal cramping was related to her stomach or GYN issues. Finally had a menstrual cycle couple weeks ago. Always associated with heavy bleeding. Notes that she takes significant ibuprofen 600 mg several times daily during her cycle at least for a week to 10 days per month.  Every time she eats she feels very unsettled on her stomach. She has discomfort in the epigastrium and lower abdomen. Feels like her stomach is on fire. Past couple of years she's been having normal to loose stools with fecal urgency postprandially. At least 2-3 stools daily. Occasional nocturnal diarrhea. Seem to be worse after her gallbladder was removed. Notes that she consumes a lot of caffeinated beverages and eats very unhealthy. Recently her refrigerator was turned off for over 24 hours and she wasn't aware of it. She wonders if she ate contaminated food. Several episodes of melena, last time about 10 days ago. Stools are now brown. Denies any history of Pepto-Bismol use. She has had some breakthrough heartburn on omeprazole. No dysphagia. No weight loss. Denies typical heartburn on omeprazole.   No prior colonoscopy. EGD by Dr. Gala Romney back in September 2013 for abdominal pain with history of treated H. pylori. Found to have nonspecific, benign gastritis, fundic gland polyps, small hiatal hernia. CT at that time showed no significant findings to explain her abdominal pain.   Current Outpatient Prescriptions  Medication Sig Dispense Refill  . fluticasone (FLONASE) 50 MCG/ACT nasal spray USE TWO  SPRAY(S) IN EACH NOSTRIL ONCE DAILY 16 g 5  . ibuprofen (ADVIL,MOTRIN) 200 MG tablet Take 600 mg by mouth every 6 (six) hours as needed (ta).    . omeprazole (PRILOSEC) 20 MG capsule Take 20 mg by mouth daily.     . valACYclovir (VALTREX) 500 MG tablet TAKE ONE TABLET BY MOUTH ONCE DAILY 30 tablet 11   No current facility-administered medications for this visit.    Allergies as of 12/18/2014 - Review Complete 12/18/2014  Allergen Reaction Noted  . Codeine  11/17/2012    Past Medical History  Diagnosis Date  . COPD (chronic obstructive pulmonary disease)   . Asthma   . Genital herpes   . Neck pain   . MRSA carrier   . Menorrhagia     [facility]  . H. pylori infection 11/2011  . Varicose veins   . Leg pain, right   . Allergy     Past Surgical History  Procedure Laterality Date  . Uterine septum resection  2011  . Uterine fibroid surgery  2011  . Fibula fracture surgery  2010  . Ankle surgery  1999  . Cholecystectomy  04/07/2011    Dr Elenor Quinones dyskinesia  . Esophagogastroduodenoscopy  03/02/2012    RMR: small hiatal hernia. Gastric polyp and abnormal gastric mucosa of doubtfull clinical significance- status post biopsy (benign). No explanation for abdominal pain based on today's examination.     Family History  Problem Relation Age of Onset  . Anesthesia problems Neg Hx   . Hypotension Neg Hx   . Malignant hyperthermia Neg Hx   .  Pseudochol deficiency Neg Hx   . Hypertension Mother   . Hypertension Father   . Heart disease Father     heart attack  . Stroke Father   . Colon cancer Other     patient does not know beyond mother (mother was adopted)    History   Social History  . Marital Status: Divorced    Spouse Name: N/A  . Number of Children: 0  . Years of Education: N/A   Occupational History  . disabled    Social History Main Topics  . Smoking status: Current Every Day Smoker -- 0.50 packs/day for 10 years    Types: Cigarettes  . Smokeless  tobacco: Never Used  . Alcohol Use: No  . Drug Use: 1.00 per week    Special: Marijuana, Oxycodone     Comment: once a week  . Sexual Activity: Yes    Birth Control/ Protection: Other-see comments, None, Condom     Comment: pull-out   Other Topics Concern  . Not on file   Social History Narrative      ROS:  General: Negative for anorexia, weight loss, fever, chills, fatigue, weakness. Eyes: Negative for vision changes.  ENT: Negative for hoarseness, difficulty swallowing , nasal congestion. CV: Negative for chest pain, angina, palpitations, dyspnea on exertion, peripheral edema.  Respiratory: Negative for dyspnea at rest, dyspnea on exertion, cough, sputum, wheezing.  GI: See history of present illness. GU:  Negative for dysuria, hematuria, urinary incontinence, urinary frequency, nocturnal urination.  MS: Negative for joint pain, low back pain.  Derm: Negative for rash or itching.  Neuro: Negative for weakness, abnormal sensation, seizure, frequent headaches, memory loss, confusion.  Psych: Negative for anxiety, depression, suicidal ideation, hallucinations.  Endo: Negative for unusual weight change.  Heme: Negative for bruising or bleeding. Allergy: Negative for rash or hives.    Physical Examination:  BP 105/64 mmHg  Pulse 75  Temp(Src) 98 F (36.7 C) (Oral)  Ht 5\' 4"  (1.626 m)  Wt 134 lb 12.8 oz (61.145 kg)  BMI 23.13 kg/m2  LMP 12/17/2014   General: Well-nourished, well-developed in no acute distress.  Head: Normocephalic, atraumatic.   Eyes: Conjunctiva pink, no icterus. Mouth: Oropharyngeal mucosa moist and pink , no lesions erythema or exudate. Neck: Supple without thyromegaly, masses, or lymphadenopathy.  Lungs: Clear to auscultation bilaterally.  Heart: Regular rate and rhythm, no murmurs rubs or gallops.  Abdomen: Bowel sounds are normal, mild epigastric tenderness, lower abdominal tenderness, nondistended, no hepatosplenomegaly or masses, no abdominal  bruits or    hernia , no rebound or guarding.   Rectal: not performed Extremities: No lower extremity edema. No clubbing or deformities.  Neuro: Alert and oriented x 4 , grossly normal neurologically.  Skin: Warm and dry, no rash or jaundice.   Psych: Alert and cooperative, normal mood and affect.  Labs: Lab Results  Component Value Date   WBC 8.2 11/22/2014   HGB 14.1 11/22/2014   HCT 42.9 11/22/2014   PLT 265 11/22/2014         Lab Results  Component Value Date   CREATININE 0.64 11/22/2014   BUN 12 11/22/2014   NA 141 11/22/2014   K 4.5 11/22/2014   CL 101 11/22/2014   CO2 25 11/22/2014   Lab Results  Component Value Date   ALT 19 11/22/2014   AST 24 11/22/2014   ALKPHOS 70 11/22/2014   BILITOT 0.9 11/22/2014   Lab Results  Component Value Date   LIPASE 39 11/22/2014  Imaging Studies: No results found.

## 2014-12-18 NOTE — Assessment & Plan Note (Signed)
Chronic diarrhea, couple of years duration. Postprandial fecal urgency. Occasional nocturnal stools. Symptoms worsened postcholecystectomy. Suspect IBS +/- bile salt diarrhea. With recent worsening symptoms and no prior endoscopic evaluation, recommend colonoscopy to rule out IBD. Augment conscious sedation with phenergan 25mg  IV 30 minutes before the procedure.  I have discussed the risks, alternatives, benefits with regards to but not limited to the risk of reaction to medication, bleeding, infection, perforation and the patient is agreeable to proceed. Written consent to be obtained.  Check celiac serologies.

## 2014-12-18 NOTE — Assessment & Plan Note (Signed)
50 show female presenting with recent acute on chronic GI issues. Seen back in 2013 with abdominal pain, EGD showed gastritis. Prior to EGD she had been treated for H pylori.   States over the past several months she's had increased upper and lower abdominal pain. Upper abdominal pain described as burning, reports several episodes of melena although CBC unremarkable. Refractory heartburn. Extensive ibuprofen use. Recommend upper endoscopy for further evaluation of gastritis, peptic ulcer disease. Augment conscious sedation with Phenergan 25 mg IV 15minutes prior to the procedure as this was successful in the past.  I have discussed the risks, alternatives, benefits with regards to but not limited to the risk of reaction to medication, bleeding, infection, perforation and the patient is agreeable to proceed. Written consent to be obtained.

## 2014-12-18 NOTE — Progress Notes (Signed)
cc'd to pcp 

## 2014-12-18 NOTE — Patient Instructions (Signed)
1. Upper endoscopy and colonoscopy with Dr. Gala Romney. See separate instructions.  2. Please have your labs done as soon as possible.

## 2014-12-25 ENCOUNTER — Ambulatory Visit (INDEPENDENT_AMBULATORY_CARE_PROVIDER_SITE_OTHER): Payer: Medicare Other | Admitting: Family Medicine

## 2014-12-25 ENCOUNTER — Encounter: Payer: Self-pay | Admitting: Family Medicine

## 2014-12-25 VITALS — BP 110/80 | Temp 98.0°F | Ht 64.25 in | Wt 132.4 lb

## 2014-12-25 DIAGNOSIS — J019 Acute sinusitis, unspecified: Secondary | ICD-10-CM | POA: Diagnosis not present

## 2014-12-25 DIAGNOSIS — J209 Acute bronchitis, unspecified: Secondary | ICD-10-CM | POA: Diagnosis not present

## 2014-12-25 MED ORDER — FLUCONAZOLE 150 MG PO TABS: 150.0000 mg | ORAL_TABLET | Freq: Once | ORAL | Status: DC

## 2014-12-25 MED ORDER — AMOXICILLIN-POT CLAVULANATE 875-125 MG PO TABS: 1.0000 | ORAL_TABLET | Freq: Two times a day (BID) | ORAL | Status: AC

## 2014-12-25 NOTE — Progress Notes (Signed)
   Subjective:    Patient ID: Mackenzie Williams, female    DOB: Sep 28, 1970, 44 y.o.   MRN: [id number]  Eye Pain  Both eyes are affected.This is a new problem. The current episode started in the past 7 days. The problem occurs intermittently. The problem has been unchanged. There was no injury mechanism. The pain is moderate. Associated symptoms include an eye discharge and eye redness. She has tried eye drops for the symptoms. The treatment provided no relief.  Cough This is a new problem. The current episode started in the past 7 days. The problem has been unchanged. The cough is productive of sputum. Associated symptoms include ear pain, eye redness, a sore throat and wheezing. Nothing aggravates the symptoms. Treatments tried: sinus medication  The treatment provided no relief.   Patient states that she has mildew in her apartment.   Patient states that she has no other concerns at this time.   Eye painful and crustly  Pinkish red and would not go away''  Headache  cheecks and frontal headache  Dim energy  Low gr fever   Review of Systems  HENT: Positive for ear pain and sore throat.   Eyes: Positive for pain, discharge and redness.  Respiratory: Positive for cough and wheezing.        Objective:   Physical Exam  Alert moderate malaise. Alert moderate malaise. Frontal headache positive nasal congestion. Vital stable. Lungs clear. Heart rare rhythm bronchial cough during exam      Assessment & Plan:  Impression acute rhinosinusitis plan Anti-biotics prescribed. Symptom care discussed warning signs discussed encouraged to stop smoking WSL

## 2014-12-26 ENCOUNTER — Telehealth: Payer: Self-pay | Admitting: Family Medicine

## 2014-12-26 MED ORDER — CEPHALEXIN 500 MG PO CAPS: 500.0000 mg | ORAL_CAPSULE | Freq: Three times a day (TID) | ORAL | Status: DC

## 2014-12-26 NOTE — Telephone Encounter (Signed)
Patient given Augmentin

## 2014-12-26 NOTE — Telephone Encounter (Signed)
Patient was seen yesterday by Dr.Steve and given antibotic but was too strong makes her sick on her stomach and can you call something else in [facility].

## 2014-12-26 NOTE — Telephone Encounter (Signed)
Spoke with patient and informed patient per Dr.Steve to stop Augmentin and start Keflex 500 mg three times a day for 10 days. Patient verbalized understanding.

## 2014-12-26 NOTE — Telephone Encounter (Signed)
[facility] 7/7

## 2014-12-26 NOTE — Telephone Encounter (Signed)
Keflex 500 tid ten d 

## 2014-12-30 ENCOUNTER — Telehealth: Payer: Self-pay | Admitting: General Practice

## 2014-12-30 NOTE — Telephone Encounter (Signed)
Pt is calling to see if it is ok to still have her TCS because she has started on Cephalexin 500 mg TID. Please advise

## 2014-12-31 NOTE — Telephone Encounter (Signed)
Ginger did Randall Hiss speak to you about this?

## 2014-12-31 NOTE — Telephone Encounter (Signed)
Discussed with Mackenzie Williams. There should be no problems with having her TCS on keflex. Of note, that will be her last day of keflex and her bronchitis should be successfully treated by then.

## 2014-12-31 NOTE — Telephone Encounter (Signed)
Pt is aware to continue with her TCS.

## 2015-01-01 ENCOUNTER — Telehealth: Payer: Self-pay | Admitting: Obstetrics & Gynecology

## 2015-01-01 ENCOUNTER — Telehealth: Payer: Self-pay | Admitting: *Deleted

## 2015-01-01 ENCOUNTER — Telehealth: Payer: Self-pay

## 2015-01-01 MED ORDER — VALACYCLOVIR HCL 500 MG PO TABS: 500.0000 mg | ORAL_TABLET | Freq: Every day | ORAL | Status: DC

## 2015-01-01 NOTE — Telephone Encounter (Signed)
Pt requesting refill on Valtrex.

## 2015-01-01 NOTE — Telephone Encounter (Signed)
Pt is calling because she is on anti-bx and she is worried about not being able to eat with the medication. She would like to cancel her TCS because of this. She would like to reschedule.

## 2015-01-03 ENCOUNTER — Other Ambulatory Visit: Payer: Self-pay | Admitting: Family Medicine

## 2015-01-03 ENCOUNTER — Encounter (HOSPITAL_COMMUNITY): Admission: RE | Payer: Self-pay | Source: Ambulatory Visit

## 2015-01-03 ENCOUNTER — Ambulatory Visit (HOSPITAL_COMMUNITY): Admission: RE | Admit: 2015-01-03 | Payer: Medicare Other | Source: Ambulatory Visit | Admitting: Internal Medicine

## 2015-01-03 SURGERY — COLONOSCOPY
Anesthesia: Moderate Sedation

## 2015-01-03 NOTE — Telephone Encounter (Signed)
Noted. She will need another OV prior to rescheduling to update H&P.

## 2015-01-14 ENCOUNTER — Telehealth: Payer: Self-pay | Admitting: Family Medicine

## 2015-01-14 NOTE — Telephone Encounter (Signed)
Pt advised and understands. 

## 2015-01-14 NOTE — Telephone Encounter (Signed)
Pt states she woke in the middle of the night an felt like she  Swallowed a rather large bug. She is concerned she should maybe Do something to get this bug to expel from her system like, induced  Vomiting or taking a laxative. She has not taken her daily meds as of  Yet out of fear of this possible bug being in her stomach.   Advised her that if she did swallow a bug it was most likely dissolved  Upon entering her stomach area by her stomach acids.   [phone]

## 2015-01-14 NOTE — Telephone Encounter (Signed)
Reassure pt that she should be fine and her stomach acids will take care of it if she swallowed a bug ( in [place] that is protein)

## 2015-01-24 ENCOUNTER — Other Ambulatory Visit: Payer: Self-pay

## 2015-01-24 DIAGNOSIS — R195 Other fecal abnormalities: Secondary | ICD-10-CM

## 2015-01-25 ENCOUNTER — Other Ambulatory Visit: Payer: Self-pay | Admitting: Family Medicine

## 2015-01-25 DIAGNOSIS — R195 Other fecal abnormalities: Secondary | ICD-10-CM | POA: Diagnosis not present

## 2015-01-28 ENCOUNTER — Encounter: Payer: Self-pay | Admitting: Family Medicine

## 2015-01-28 ENCOUNTER — Ambulatory Visit (INDEPENDENT_AMBULATORY_CARE_PROVIDER_SITE_OTHER): Payer: Medicare Other | Admitting: Family Medicine

## 2015-01-28 VITALS — BP 122/80 | Temp 98.4°F | Ht 64.25 in | Wt 130.0 lb

## 2015-01-28 DIAGNOSIS — R51 Headache: Secondary | ICD-10-CM

## 2015-01-28 DIAGNOSIS — R519 Headache, unspecified: Secondary | ICD-10-CM

## 2015-01-28 NOTE — Patient Instructions (Addendum)
Taper off of sodas and caffiene  Drink more water  May use ibuprofen when needed for headache  If headaches worsen please follow up  We will call you with the stool test    Smoking Cessation Quitting smoking is important to your health and has many advantages. However, it is not always easy to quit since nicotine is a very addictive drug. Oftentimes, people try 3 times or more before being able to quit. This document explains the best ways for you to prepare to quit smoking. Quitting takes hard work and a lot of effort, but you can do it. ADVANTAGES OF QUITTING SMOKING  You will live longer, feel better, and live better.  Your body will feel the impact of quitting smoking almost immediately.  Within 20 minutes, blood pressure decreases. Your pulse returns to its normal level.  After 8 hours, carbon monoxide levels in the blood return to normal. Your oxygen level increases.  After 24 hours, the chance of having a heart attack starts to decrease. Your breath, hair, and body stop smelling like smoke.  After 48 hours, damaged nerve endings begin to recover. Your sense of taste and smell improve.  After 72 hours, the body is virtually free of nicotine. Your bronchial tubes relax and breathing becomes easier.  After 2 to 12 weeks, lungs can hold more air. Exercise becomes easier and circulation improves.  The risk of having a heart attack, stroke, cancer, or lung disease is greatly reduced.  After 1 year, the risk of coronary heart disease is cut in half.  After 5 years, the risk of stroke falls to the same as a nonsmoker.  After 10 years, the risk of lung cancer is cut in half and the risk of other cancers decreases significantly.  After 15 years, the risk of coronary heart disease drops, usually to the level of a nonsmoker.  If you are pregnant, quitting smoking will improve your chances of having a healthy baby.  The people you live with, especially any children, will be  healthier.  You will have extra money to spend on things other than cigarettes. QUESTIONS TO THINK ABOUT BEFORE ATTEMPTING TO QUIT You may want to talk about your answers with your health care provider.  Why do you want to quit?  If you tried to quit in the past, what helped and what did not?  What will be the most difficult situations for you after you quit? How will you plan to handle them?  Who can help you through the tough times? Your family? Friends? A health care provider?  What pleasures do you get from smoking? What ways can you still get pleasure if you quit? Here are some questions to ask your health care provider:  How can you help me to be successful at quitting?  What medicine do you think would be best for me and how should I take it?  What should I do if I need more help?  What is smoking withdrawal like? How can I get information on withdrawal? GET READY  Set a quit date.  Change your environment by getting rid of all cigarettes, ashtrays, matches, and lighters in your home, car, or work. Do not let people smoke in your home.  Review your past attempts to quit. Think about what worked and what did not. GET SUPPORT AND ENCOURAGEMENT You have a better chance of being successful if you have help. You can get support in many ways.  Tell your family, friends, and  coworkers that you are going to quit and need their support. Ask them not to smoke around you.  Get individual, group, or telephone counseling and support. Programs are available at [employer] and health centers. Call your local health department for information about programs in your area.  Spiritual beliefs and practices may help some smokers quit.  Download a "quit meter" on your computer to keep track of quit statistics, such as how long you have gone without smoking, cigarettes not smoked, and money saved.  Get a self-help book about quitting smoking and staying off tobacco. Henry yourself from urges to smoke. Talk to someone, go for a walk, or occupy your time with a task.  Change your normal routine. Take a different route to work. Drink tea instead of coffee. Eat breakfast in a different place.  Reduce your stress. Take a hot bath, exercise, or read a book.  Plan something enjoyable to do every day. Reward yourself for not smoking.  Explore interactive web-based programs that specialize in helping you quit. GET MEDICINE AND USE IT CORRECTLY Medicines can help you stop smoking and decrease the urge to smoke. Combining medicine with the above behavioral methods and support can greatly increase your chances of successfully quitting smoking.  Nicotine replacement therapy helps deliver nicotine to your body without the negative effects and risks of smoking. Nicotine replacement therapy includes nicotine gum, lozenges, inhalers, nasal sprays, and skin patches. Some may be available over-the-counter and others require a prescription.  Antidepressant medicine helps people abstain from smoking, but how this works is unknown. This medicine is available by prescription.  Nicotinic receptor partial agonist medicine simulates the effect of nicotine in your brain. This medicine is available by prescription. Ask your health care provider for advice about which medicines to use and how to use them based on your health history. Your health care provider will tell you what side effects to look out for if you choose to be on a medicine or therapy. Carefully read the information on the package. Do not use any other product containing nicotine while using a nicotine replacement product.  RELAPSE OR DIFFICULT SITUATIONS Most relapses occur within the first 3 months after quitting. Do not be discouraged if you start smoking again. Remember, most people try several times before finally quitting. You may have symptoms of withdrawal because your body is used to nicotine.  You may crave cigarettes, be irritable, feel very hungry, cough often, get headaches, or have difficulty concentrating. The withdrawal symptoms are only temporary. They are strongest when you first quit, but they will go away within 10-14 days. To reduce the chances of relapse, try to:  Avoid drinking alcohol. Drinking lowers your chances of successfully quitting.  Reduce the amount of caffeine you consume. Once you quit smoking, the amount of caffeine in your body increases and can give you symptoms, such as a rapid heartbeat, sweating, and anxiety.  Avoid smokers because they can make you want to smoke.  Do not let weight gain distract you. Many smokers will gain weight when they quit, usually less than 10 pounds. Eat a healthy diet and stay active. You can always lose the weight gained after you quit.  Find ways to improve your mood other than smoking. FOR MORE INFORMATION  www.smokefree.gov  Document Released: 06/01/2001 Document Revised: 10/22/2013 Document Reviewed: 09/16/2011 [facility] Patient Information 2015 [place], [place]. This information is not intended to replace advice given to you by your health  care provider. Make sure you discuss any questions you have with your health care provider.

## 2015-01-28 NOTE — Progress Notes (Signed)
   Subjective:    Patient ID: Mackenzie Williams, female    DOB: Oct 07, 1970, 44 y.o.   MRN: [id number]  Headache  This is a recurrent problem. The current episode started 1 to 4 weeks ago. The problem occurs intermittently. The problem has been unchanged. The pain is located in the frontal region. The pain radiates to the face. The quality of the pain is described as aching and sharp. The pain is moderate. Associated symptoms include drainage and nausea. Pertinent negatives include no anorexia, fever or vomiting. She has tried acetaminophen and NSAIDs for the symptoms. The treatment provided mild relief. There is no history of hypertension or obesity.   Patient arrives with c/o headaches for a while- some intense in nature. 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  Gastrointestinal: Positive for nausea. Negative for vomiting and anorexia.  Neurological: Positive for headaches.       Objective:   Physical Exam  Constitutional: She appears well-developed.  HENT:  Head: Normocephalic.  Nose: Nose normal.  Mouth/Throat: Oropharynx is clear and moist. No oropharyngeal exudate.  Neck: Neck supple.  Cardiovascular: Normal rate and normal heart sounds.   No murmur heard. Pulmonary/Chest: Effort normal and breath sounds normal. She has no wheezes.  Abdominal: Soft. There is no tenderness.  Lymphadenopathy:    She has no cervical adenopathy.  Skin: Skin is warm and dry.  Nursing note and vitals reviewed.         Assessment & Plan:   headache-nonspecific only occurs occasionally she will keep an eye on it if he gets worse or more frequent she will follow-up sooner  No red flags if her headaches get worse she will  follow-up immediately   possible parasite await stool specimen

## 2015-01-29 LAB — OVA AND PARASITE EXAMINATION

## 2015-02-03 ENCOUNTER — Other Ambulatory Visit: Payer: Self-pay | Admitting: Family Medicine

## 2015-02-03 ENCOUNTER — Telehealth: Payer: Self-pay | Admitting: Family Medicine

## 2015-02-03 MED ORDER — OMEPRAZOLE 20 MG PO CPDR: 20.0000 mg | DELAYED_RELEASE_CAPSULE | Freq: Every day | ORAL | Status: DC

## 2015-02-03 NOTE — Telephone Encounter (Signed)
Pt is needing a refill on her omeprazole   [facility] [employer]

## 2015-02-03 NOTE — Telephone Encounter (Signed)
Notified patient that refill was sent to pharmacy.

## 2015-02-06 DIAGNOSIS — Z79891 Long term (current) use of opiate analgesic: Secondary | ICD-10-CM | POA: Diagnosis not present

## 2015-02-06 DIAGNOSIS — M1990 Unspecified osteoarthritis, unspecified site: Secondary | ICD-10-CM | POA: Diagnosis not present

## 2015-02-06 DIAGNOSIS — Z7951 Long term (current) use of inhaled steroids: Secondary | ICD-10-CM | POA: Diagnosis not present

## 2015-02-06 DIAGNOSIS — Z79899 Other long term (current) drug therapy: Secondary | ICD-10-CM | POA: Diagnosis not present

## 2015-02-06 DIAGNOSIS — M25571 Pain in right ankle and joints of right foot: Secondary | ICD-10-CM | POA: Diagnosis not present

## 2015-02-06 DIAGNOSIS — F17210 Nicotine dependence, cigarettes, uncomplicated: Secondary | ICD-10-CM | POA: Diagnosis not present

## 2015-02-06 DIAGNOSIS — M19071 Primary osteoarthritis, right ankle and foot: Secondary | ICD-10-CM | POA: Diagnosis not present

## 2015-03-11 ENCOUNTER — Other Ambulatory Visit: Payer: Medicare Other | Admitting: Women's Health

## 2015-03-25 ENCOUNTER — Other Ambulatory Visit (HOSPITAL_COMMUNITY)
Admission: RE | Admit: 2015-03-25 | Discharge: 2015-03-25 | Disposition: A | Discharge status: Home / Self Care | Payer: Medicare Other | Source: Ambulatory Visit | Attending: Obstetrics & Gynecology | Admitting: Obstetrics & Gynecology

## 2015-03-25 ENCOUNTER — Encounter: Payer: Self-pay | Admitting: Women's Health

## 2015-03-25 ENCOUNTER — Ambulatory Visit (INDEPENDENT_AMBULATORY_CARE_PROVIDER_SITE_OTHER): Payer: Medicare Other | Admitting: Women's Health

## 2015-03-25 VITALS — BP 110/60 | HR 68 | Ht 64.25 in | Wt 128.0 lb

## 2015-03-25 DIAGNOSIS — N949 Unspecified condition associated with female genital organs and menstrual cycle: Secondary | ICD-10-CM

## 2015-03-25 DIAGNOSIS — Z01419 Encounter for gynecological examination (general) (routine) without abnormal findings: Secondary | ICD-10-CM | POA: Insufficient documentation

## 2015-03-25 DIAGNOSIS — Z1151 Encounter for screening for human papillomavirus (HPV): Secondary | ICD-10-CM | POA: Insufficient documentation

## 2015-03-25 DIAGNOSIS — R102 Pelvic and perineal pain: Secondary | ICD-10-CM

## 2015-03-25 DIAGNOSIS — F17200 Nicotine dependence, unspecified, uncomplicated: Secondary | ICD-10-CM | POA: Insufficient documentation

## 2015-03-25 DIAGNOSIS — N926 Irregular menstruation, unspecified: Secondary | ICD-10-CM | POA: Insufficient documentation

## 2015-03-25 LAB — POCT WET PREP (WET MOUNT): Clue Cells Wet Prep Whiff POC: NEGATIVE

## 2015-03-25 NOTE — Patient Instructions (Addendum)
Call to get mammogram scheduled  Call to get colonoscopy and EGD rescheduled  [place], [place], [place] for vaginal moisture  1-800-quit-now  Smoking Cessation, Tips for Success If you are ready to quit smoking, congratulations! You have chosen to help yourself be healthier. Cigarettes bring nicotine, tar, carbon monoxide, and other irritants into your body. Your lungs, heart, and blood vessels will be able to work better without these poisons. There are many different ways to quit smoking. Nicotine gum, nicotine patches, a nicotine inhaler, or nicotine nasal spray can help with physical craving. Hypnosis, support groups, and medicines help break the habit of smoking. WHAT THINGS CAN I DO TO MAKE QUITTING EASIER?  Here are some tips to help you quit for good:  Pick a date when you will quit smoking completely. Tell all of your friends and family about your plan to quit on that date.  Do not try to slowly cut down on the number of cigarettes you are smoking. Pick a quit date and quit smoking completely starting on that day.  Throw away all cigarettes.   Clean and remove all ashtrays from your home, work, and car.  On a card, write down your reasons for quitting. Carry the card with you and read it when you get the urge to smoke.  Cleanse your body of nicotine. Drink enough water and fluids to keep your urine clear or pale yellow. Do this after quitting to flush the nicotine from your body.  Learn to predict your moods. Do not let a bad situation be your excuse to have a cigarette. Some situations in your life might tempt you into wanting a cigarette.  Never have "just one" cigarette. It leads to wanting another and another. Remind yourself of your decision to quit.  Change habits associated with smoking. If you smoked while driving or when feeling stressed, try other activities to replace smoking. Stand up when drinking your coffee. Brush your teeth after eating. Sit in a different  chair when you read the paper. Avoid alcohol while trying to quit, and try to drink fewer caffeinated beverages. Alcohol and caffeine may urge you to smoke.  Avoid foods and drinks that can trigger a desire to smoke, such as sugary or spicy foods and alcohol.  Ask people who smoke not to smoke around you.  Have something planned to do right after eating or having a cup of coffee. For example, plan to take a walk or exercise.  Try a relaxation exercise to calm you down and decrease your stress. Remember, you may be tense and nervous for the first 2 weeks after you quit, but this will pass.  Find new activities to keep your hands busy. Play with a pen, coin, or rubber band. Doodle or draw things on paper.  Brush your teeth right after eating. This will help cut down on the craving for the taste of tobacco after meals. You can also try mouthwash.   Use oral substitutes in place of cigarettes. Try using lemon drops, carrots, cinnamon sticks, or chewing gum. Keep them handy so they are available when you have the urge to smoke.  When you have the urge to smoke, try deep breathing.  Designate your home as a nonsmoking area.  If you are a heavy smoker, ask your health care provider about a prescription for nicotine chewing gum. It can ease your withdrawal from nicotine.  Reward yourself. Set aside the cigarette money you save and buy yourself something nice.  Look for  support from others. Join a support group or smoking cessation program. Ask someone at home or at work to help you with your plan to quit smoking.  Always ask yourself, "Do I need this cigarette or is this just a reflex?" Tell yourself, "Today, I choose not to smoke," or "I do not want to smoke." You are reminding yourself of your decision to quit.  Do not replace cigarette smoking with electronic cigarettes (commonly called e-cigarettes). The safety of e-cigarettes is unknown, and some may contain harmful chemicals.  If you  relapse, do not give up! Plan ahead and think about what you will do the next time you get the urge to smoke. HOW WILL I FEEL WHEN I QUIT SMOKING? You may have symptoms of withdrawal because your body is used to nicotine (the addictive substance in cigarettes). You may crave cigarettes, be irritable, feel very hungry, cough often, get headaches, or have difficulty concentrating. The withdrawal symptoms are only temporary. They are strongest when you first quit but will go away within 10-14 days. When withdrawal symptoms occur, stay in control. Think about your reasons for quitting. Remind yourself that these are signs that your body is healing and getting used to being without cigarettes. Remember that withdrawal symptoms are easier to treat than the major diseases that smoking can cause.  Even after the withdrawal is over, expect periodic urges to smoke. However, these cravings are generally short lived and will go away whether you smoke or not. Do not smoke! WHAT RESOURCES ARE AVAILABLE TO HELP ME QUIT SMOKING? Your health care provider can direct you to community resources or hospitals for support, which may include:  Group support.  Education.  Hypnosis.  Therapy. Document Released: 03/05/2004 Document Revised: 10/22/2013 Document Reviewed: 11/23/2012 [facility] Patient Information 2015 [place], [place]. This information is not intended to replace advice given to you by your health care provider. Make sure you discuss any questions you have with your health care provider.

## 2015-03-25 NOTE — Progress Notes (Signed)
Patient ID: Mackenzie Williams, female   DOB: 11-21-1970, 44 y.o.   MRN: [id number] Subjective:   Mackenzie Williams is a 44 y.o.  Caucasian female here for a routine well-woman exam.  Patient's last menstrual period was 11/11/2014 (approximate).    Current complaints: pelvic pain, irregular periods- last period was in may or June, has just had occ spotting since then, vaginal dryness. Concerned she may have ovarian cancer- had 'abnormal looking' ovaries in past, also concerned for uterine cancer 'scar on uterus' from stillbirth. Dad recently dx w/ liver cancer.  Was supposed to have tcs/egd recently- cancelled d/t being on 3 different antibiotics PCP: [place] done by PCP  Social History: Sexual: heterosexual Marital Status: dating Living situation: alone Occupation: unemployed Tobacco/alcohol: tobacco: 1ppd trying to quit, etoh: none Illicit drugs: no history of illicit drug use  The following portions of the patient's history were reviewed and updated as appropriate: allergies, current medications, past family history, past medical history, past social history, past surgical history and problem list.  Past Medical History Past Medical History  Diagnosis Date  . COPD (chronic obstructive pulmonary disease) ([place])   . Asthma   . Genital herpes   . Neck pain   . MRSA carrier   . Menorrhagia     [facility]  . H. pylori infection 11/2011  . Varicose veins   . Leg pain, right   . Allergy   . Esophageal hernia     Past Surgical History Past Surgical History  Procedure Laterality Date  . Uterine septum resection  2011  . Uterine fibroid surgery  2011  . Fibula fracture surgery  2010  . Ankle surgery  1999  . Cholecystectomy  04/07/2011    Dr Elenor Quinones dyskinesia  . Esophagogastroduodenoscopy  03/02/2012    RMR: small hiatal hernia. Gastric polyp and abnormal gastric mucosa of doubtfull clinical significance- status post biopsy (benign). No explanation  for abdominal pain based on today's examination.   . Hip surgery      Gynecologic History [id number]  Patient's last menstrual period was 11/11/2014 (approximate). Contraception: condoms occ  Last Pap: 2013. Results were: normal Last mammogram: 2008. Results were: bi-rads 2 Last TCS: never  Obstetric History OB History  Gravida Para Term Preterm AB SAB TAB Ectopic Multiple Living  2    2 2         # Outcome Date GA Lbr Len/2nd Weight Sex Delivery Anes PTL Lv  2 SAB 2002          1 SAB               Current Medications Current Outpatient Prescriptions on File Prior to Visit  Medication Sig Dispense Refill  . omeprazole (PRILOSEC) 20 MG capsule TAKE ONE CAPSULE BY MOUTH ONCE DAILY 30 capsule 0  . valACYclovir (VALTREX) 500 MG tablet Take 1 tablet (500 mg total) by mouth daily. 30 tablet 11  . fluticasone (FLONASE) 50 MCG/ACT nasal spray USE TWO SPRAY(S) IN EACH NOSTRIL ONCE DAILY (Patient not taking: Reported on 03/25/2015) 16 g 5  . ibuprofen (ADVIL,MOTRIN) 200 MG tablet Take 600 mg by mouth every 6 (six) hours as needed (ta).     No current facility-administered medications on file prior to visit.    Review of Systems Patient denies any headaches, blurred vision, shortness of breath, chest pain, abdominal pain, problems with bowel movements, urination, or intercourse.  Objective:  BP 110/60 mmHg  Pulse 68  Ht 5' 4.25" (1.632 m)  Wt 128 lb (58.06 kg)  BMI 21.80 kg/m2  LMP 11/11/2014 (Approximate) Physical Exam  General:  Well developed, well nourished, no acute distress. She is alert and oriented x3. Skin:  Warm and dry Neck:  Midline trachea, no thyromegaly or nodules Cardiovascular: Regular rate and rhythm, no murmur heard Lungs:  Effort normal, all lung fields clear to auscultation bilaterally Breasts:  No dominant palpable mass, retraction, or nipple discharge Abdomen:  Soft, non tender, no hepatosplenomegaly or masses Pelvic:  External genitalia is normal in  appearance.  The vagina is normal in appearance. The cervix is bulbous, no CMT.  Thin prep pap is done w/  HR HPV cotesting. Uterus is felt to be normal size, shape, and contour.  No adnexal masses. Mild tenderness to palpation of uterus and bilateral adnexae.  Extremities:  No swelling or varicosities noted Psych:  She has a normal mood and affect  Results for orders placed or performed in visit on 03/25/15 (from the past 24 hour(s))  POCT Wet Prep [facility] [place])     Status: Normal   Collection Time: 03/25/15 11:33 AM  Result Value Ref Range   Source Wet Prep POC vaginal    WBC, Wet Prep HPF POC none    Bacteria Wet Prep HPF POC None None, Few   BACTERIA WET PREP MORPHOLOGY POC     Clue Cells Wet Prep HPF POC None None   Clue Cells Wet Prep Whiff POC Negative Whiff    Yeast Wet Prep HPF POC None    KOH Wet Prep POC     Trichomonas Wet Prep HPF POC none     Assessment:   Healthy well-woman exam Pelvic pain Irregular menses/Perimenopausal Vaginal dryness Smoker  Plan:  Can try luvena, astroglide, ky jelly for vaginal moisture Advised smoking cessation, pt wants to quit, can call 1-800-quit-now, try otc smoking cessation aids, gave printed info on smoking cessation F/U 1wk for pelvic u/s d/t pelvic pain, then f/u w/ me, or sooner if needed Mammogram- call to schedule screening mammo asap  Colonoscopy- call to reschedule TCS & EGD asap    Tawnya Crook CNM, WHNP-BC 03/25/2015 11:01 AM

## 2015-03-27 LAB — CYTOLOGY - PAP

## 2015-04-01 ENCOUNTER — Ambulatory Visit: Payer: Medicare Other | Admitting: Women's Health

## 2015-04-01 ENCOUNTER — Other Ambulatory Visit: Payer: Medicare Other

## 2015-04-16 ENCOUNTER — Encounter: Payer: Self-pay | Admitting: Family Medicine

## 2015-04-16 ENCOUNTER — Ambulatory Visit (INDEPENDENT_AMBULATORY_CARE_PROVIDER_SITE_OTHER): Payer: Medicare Other | Admitting: Family Medicine

## 2015-04-16 VITALS — BP 110/68 | Ht 64.25 in | Wt 127.0 lb

## 2015-04-16 DIAGNOSIS — R0789 Other chest pain: Secondary | ICD-10-CM | POA: Diagnosis not present

## 2015-04-16 DIAGNOSIS — R739 Hyperglycemia, unspecified: Secondary | ICD-10-CM

## 2015-04-16 DIAGNOSIS — E785 Hyperlipidemia, unspecified: Secondary | ICD-10-CM | POA: Diagnosis not present

## 2015-04-16 NOTE — Progress Notes (Signed)
   Subjective:    Patient ID: Mackenzie Williams, female    DOB: [birth year], 44 y.o.   MRN: [id number]  Chest Pain  This is a new problem. The current episode started more than 1 month ago. The quality of the pain is described as burning, stabbing, dull and tightness. Associated symptoms include headaches and leg pain.  Leg Pain  The incident occurred more than 1 week ago. There was no injury mechanism. The pain is present in the right leg.  Headache  This is a new problem. The current episode started more than 1 month ago.   Patient in today for chest pain, leg pain, headache. Patient states that she recently stopped smoking last week. Head and chest have been hurting more since.  Patient relates leg pain intermittent since a hardened area busted underneath the skin on left a large bruise she denies swelling of the leg. Patient states leg pain started out as a "hard" spot on her right leg that later "busted" and she could see blood behind the skin. Patient does relate large amount of varicose veins on both sides. Patient relates occasional pain in her throat when she swallows has history of smoking denies hemoptysis  Review of Systems  Cardiovascular: Positive for chest pain.  Neurological: Positive for headaches.   She describes intermittent chest pains 10-20 minutes at a time multiple times per she denies shortness of breath with it's more short pain she denies any injury. She does smoke she is been working hard to quit    Objective:   Physical Exam Neck no masses eardrums normal throat no abnormality noted lungs clear no crackles heart is regular abdomen soft patient has bruise on the right thigh where varicose vein probably ruptured no tenderness in the thigh or in the calf no swelling in the leg nothing to raise suspicion of DVT.  25 minutes was spent with the patient. Greater than half the time was spent in discussion and answering questions and counseling regarding the issues that the  patient came in for today.  EKG looks good.    Assessment & Plan:  Atypical chest pains-patient concerned about this. We will do chest x-ray lab work. Recommend referral to cardiology. I doubt coronary artery disease the patient does have risk factors including smoking  Varicose veins-at some point patient would like to be referred for vascular surgery opinion but currently right now does not want to be.  Patient with stomach issues for which she states she will have gastroenterology do follow-up  Patient also with intermittent throat pain for which she is interested in seeing ENT but after cardiac workup gets completed

## 2015-04-17 ENCOUNTER — Encounter: Payer: Self-pay | Admitting: Family Medicine

## 2015-04-25 ENCOUNTER — Ambulatory Visit (HOSPITAL_COMMUNITY)
Admission: RE | Admit: 2015-04-25 | Discharge: 2015-04-25 | Disposition: A | Discharge status: Home / Self Care | Payer: Medicare Other | Source: Ambulatory Visit | Attending: Family Medicine | Admitting: Family Medicine

## 2015-04-25 DIAGNOSIS — E785 Hyperlipidemia, unspecified: Secondary | ICD-10-CM | POA: Diagnosis not present

## 2015-04-25 DIAGNOSIS — R739 Hyperglycemia, unspecified: Secondary | ICD-10-CM | POA: Insufficient documentation

## 2015-04-25 DIAGNOSIS — R0602 Shortness of breath: Secondary | ICD-10-CM | POA: Diagnosis not present

## 2015-04-25 DIAGNOSIS — F17200 Nicotine dependence, unspecified, uncomplicated: Secondary | ICD-10-CM | POA: Insufficient documentation

## 2015-04-25 DIAGNOSIS — R079 Chest pain, unspecified: Secondary | ICD-10-CM | POA: Diagnosis not present

## 2015-04-25 DIAGNOSIS — R0789 Other chest pain: Secondary | ICD-10-CM | POA: Diagnosis not present

## 2015-04-26 LAB — LIPID PANEL
Chol/HDL Ratio: 2.4 ratio units (ref 0.0–4.4)
Cholesterol, Total: 175 mg/dL (ref 100–199)
HDL: 73 mg/dL (ref >39)
LDL CALC: 93 mg/dL (ref 0–99)
Triglycerides: 46 mg/dL (ref 0–149)
VLDL CHOLESTEROL CAL: 9 mg/dL (ref 5–40)

## 2015-04-26 LAB — HEMOGLOBIN A1C
ESTIMATED AVERAGE GLUCOSE: 111 mg/dL
HEMOGLOBIN A1C: 5.5 % (ref 4.8–5.6)

## 2015-04-26 LAB — BASIC METABOLIC PANEL
BUN/Creatinine Ratio: 25 — ABNORMAL HIGH (ref 9–23)
BUN: 14 mg/dL (ref 6–24)
CALCIUM: 9.3 mg/dL (ref 8.7–10.2)
CO2: 25 mmol/L (ref 18–29)
Chloride: 100 mmol/L (ref 97–106)
Creatinine, Ser: 0.57 mg/dL (ref 0.57–1.00)
GFR calc Af Amer: 129 mL/min/{1.73_m2} (ref >59)
GFR calc non Af Amer: 112 mL/min/{1.73_m2} (ref >59)
GLUCOSE: 79 mg/dL (ref 65–99)
POTASSIUM: 4 mmol/L (ref 3.5–5.2)
SODIUM: 139 mmol/L (ref 136–144)

## 2015-04-27 ENCOUNTER — Encounter: Payer: Self-pay | Admitting: Family Medicine

## 2015-05-07 ENCOUNTER — Telehealth: Payer: Self-pay | Admitting: Family Medicine

## 2015-05-07 ENCOUNTER — Encounter: Payer: Self-pay | Admitting: Cardiology

## 2015-05-07 ENCOUNTER — Ambulatory Visit (INDEPENDENT_AMBULATORY_CARE_PROVIDER_SITE_OTHER): Payer: Medicare Other | Admitting: Cardiology

## 2015-05-07 VITALS — BP 106/72 | HR 65 | Ht 64.0 in | Wt 136.0 lb

## 2015-05-07 DIAGNOSIS — Z8249 Family history of ischemic heart disease and other diseases of the circulatory system: Secondary | ICD-10-CM

## 2015-05-07 DIAGNOSIS — R072 Precordial pain: Secondary | ICD-10-CM | POA: Diagnosis not present

## 2015-05-07 DIAGNOSIS — Z720 Tobacco use: Secondary | ICD-10-CM | POA: Diagnosis not present

## 2015-05-07 NOTE — Telephone Encounter (Signed)
Note mailed.

## 2015-05-07 NOTE — Telephone Encounter (Signed)
[facility] 05/07/15

## 2015-05-07 NOTE — Telephone Encounter (Signed)
Unfortunately there is no prescription deodorants I would recommend that she closely look at what is available try to use something neck is free of fragrances as much is possible

## 2015-05-07 NOTE — Progress Notes (Signed)
Cardiology Office Note  Date: 05/07/2015   ID: Mackenzie Williams, DOB 03/17/1971, MRN [id number]  PCP: Sallee Lange, MD  Consulting Cardiologist: Rozann Lesches, MD   Chief Complaint  Patient presents with  . Chest Pain    History of Present Illness: Mackenzie Williams is a 44 y.o. female referred for cardiology consultation by Dr. Wolfgang Phoenix. She presents with somewhat vague symptoms of chest discomfort. She states that for at least several months she has been experiencing a tightness in her left upper chest, sometimes a tingling on her left hand. She does not notice any clear association with these symptoms and exercise, does seem to note is more when she is smoking cigarettes. She also has reflux symptoms and takes Prilosec daily.  Cardiac risk factors include active tobacco abuse (she is trying to quit) and also premature CAD in her father developed heart disease around age 46. She had a recent LDL of 93. No history of hypertension or diabetes mellitus.  She had a recent chest x-ray which is noted below and shows hyperinflation suggestive of COPD in the setting of tobacco use. Lab work also reviewed. ECG shows nonspecific changes.  She has not undergone any prior ischemic evaluation.   Past Medical History  Diagnosis Date  . COPD (chronic obstructive pulmonary disease) ([place])   . Asthma   . Genital herpes   . Neck pain   . MRSA carrier   . Menorrhagia     [facility]  . H. pylori infection 11/2011  . Varicose veins   . Leg pain, right   . Seasonal allergies   . Esophageal hernia     Past Surgical History  Procedure Laterality Date  . Uterine septum resection  2011  . Uterine fibroid surgery  2011  . Fibula fracture surgery  2010  . Ankle surgery  1999  . Cholecystectomy  04/07/2011    Dr Elenor Quinones dyskinesia  . Esophagogastroduodenoscopy  03/02/2012    RMR: small hiatal hernia. Gastric polyp and abnormal gastric mucosa of doubtfull clinical significance-  status post biopsy (benign). No explanation for abdominal pain based on today's examination.   . Hip surgery      Current Outpatient Prescriptions  Medication Sig Dispense Refill  . fluticasone (FLONASE) 50 MCG/ACT nasal spray USE TWO SPRAY(S) IN EACH NOSTRIL ONCE DAILY 16 g 5  . ibuprofen (ADVIL,MOTRIN) 200 MG tablet Take 600 mg by mouth every 6 (six) hours as needed (ta).    . omeprazole (PRILOSEC) 20 MG capsule TAKE ONE CAPSULE BY MOUTH ONCE DAILY 30 capsule 0  . oxyCODONE-acetaminophen (PERCOCET/ROXICET) 5-325 MG tablet     . valACYclovir (VALTREX) 500 MG tablet Take 1 tablet (500 mg total) by mouth daily. 30 tablet 11   No current facility-administered medications for this visit.    Allergies:  Codeine   Social History: The patient  reports that she quit smoking about 4 weeks ago. Her smoking use included Cigarettes. She has a 5 pack-year smoking history. She has never used smokeless tobacco. She reports that she does not drink alcohol or use illicit drugs.   Family History: The patient's family history includes Colon cancer in her other; Heart disease in her father and mother; Hypertension in her father and mother; Liver cancer in her father; Stroke in her father. There is no history of Anesthesia problems, Hypotension, Malignant hyperthermia, or Pseudochol deficiency.   ROS:  Please see the history of present illness. Otherwise, complete review of systems is positive for  chronic right ankle pain following extensive surgery years ago. She uses a cane to walk and states that she is disabled related to this.  All other systems are reviewed and negative.   Physical Exam: VS:  BP 106/72 mmHg  Pulse 65  Ht 5\' 4"  (1.626 m)  Wt 136 lb (61.689 kg)  BMI 23.33 kg/m2  SpO2 98%, BMI Body mass index is 23.33 kg/(m^2).  Wt Readings from Last 3 Encounters:  05/07/15 136 lb (61.689 kg)  04/16/15 127 lb (57.607 kg)  03/25/15 128 lb (58.06 kg)     General: Patient appears comfortable at  rest. HEENT: Conjunctiva and lids normal, oropharynx clear with poor dentition. Neck: Supple, no elevated JVP or carotid bruits, no thyromegaly. Lungs: Clear to auscultation, nonlabored breathing at rest. Cardiac: Regular rate and rhythm, no S3 or significant systolic murmur, no pericardial rub. Abdomen: Soft, nontender, bowel sounds present, no guarding or rebound. Extremities: No pitting edema, distal pulses 2+. Skin: Warm and dry. Musculoskeletal: No kyphosis. Neuropsychiatric: Alert and oriented x3, affect grossly appropriate.   ECG: Tracing from 04/16/2015 showed normal sinus rhythm at 64 bpm with nonspecific ST changes in the precordial leads.  Recent Labwork: 11/22/2014: ALT 19; AST 24 04/25/2015: BUN 14; Creatinine, Ser 0.57; Potassium 4.0; Sodium 139     Component Value Date/Time   CHOL 175 04/25/2015 1426   TRIG 46 04/25/2015 1426   HDL 73 04/25/2015 1426   CHOLHDL 2.4 04/25/2015 1426   LDLCALC 93 04/25/2015 1426    Other Studies Reviewed Today:  Chest x-ray 04/25/2015: COMPARISON: PA and lateral chest x-ray of Nov 04, 2011  FINDINGS: The lungs remain hyperinflated. There is no focal infiltrate. There is no pleural effusion. No suspicious masses are observed. Subtle nodularity is stable on the left projecting over the lateral aspect of the eighth rib. The heart and pulmonary vascularity are normal. The mediastinum is normal in width. The bony thorax exhibits no acute abnormality.  IMPRESSION: Hyperinflation which may reflect COPD and the patient's smoking history. There is no evidence of pneumonia, abnormal pulmonary parenchymal masses, nor other acute cardiopulmonary abnormality.  ASSESSMENT AND PLAN:  1. Atypical chest pain with intermittent left arm tingling. Recent ECG reviewed and nonspecific, chest x-ray shows hyperinflation suggestive of COPD, and she does have ongoing tobacco abuse. She states that she is trying to stop smoking. Family history includes  premature CAD in her father. She has not undergone any prior cardiac ischemic evaluation. At the present time she is disabled related to right leg pain and previous extensive ankle surgery. She uses a cane to ambulate. We will therefore schedule a dobutamine echocardiogram for ischemic evaluation  as she is unlikely to be able to tolerate a treadmill test.  2. Ongoing tobacco abuse. We discussed smoking cessation.  Current medicines were reviewed at length with the patient today.   Orders Placed This Encounter  Procedures  . Echo stress    Disposition: Call with results.   Signed, Satira Sark, MD, [facility] 05/07/2015 10:12 AM    [facility] [address], [place], [place] [address] Phone: [phone]; Fax: [phone]

## 2015-05-07 NOTE — Telephone Encounter (Signed)
Pt has tried lots of different deodorants and they all cause itching and burning. Want to know if there is a prescription deodorant for sensitive skin or a recommendation that she can do.

## 2015-05-07 NOTE — Telephone Encounter (Signed)
Pt wants note mailed to her that states she needs deodorant that is fragrance free and for sensitive skin. She states her insurance will pay for otc deodorant if she has a note from doctor.

## 2015-05-07 NOTE — Telephone Encounter (Signed)
Pt is having problems with sensitive skin and is needing a prescription for a Deoderant that she can use. Pt is also wanting that prescription mailed to her.

## 2015-05-07 NOTE — Telephone Encounter (Signed)
[facility] to get more info 

## 2015-05-07 NOTE — Patient Instructions (Addendum)
Medication Instructions:  Your physician recommends that you continue on your current medications as directed. Please refer to the Current Medication list given to you today.    Labwork: NONE Testing/Procedures: Your physician has requested that you have a dobutamine echocardiogram. For further information please visit [email or web address]. Please follow instruction sheet as given.      Follow-Up: WE WILL CALL YOU WITH RESULTS    Any Other Special Instructions Will Be Listed Below (If Applicable).       If you need a refill on your cardiac medications before your next appointment, please call your pharmacy.  Thanks for choosing [facility]!!!

## 2015-05-14 ENCOUNTER — Other Ambulatory Visit (HOSPITAL_COMMUNITY): Payer: Medicare Other

## 2015-05-19 ENCOUNTER — Inpatient Hospital Stay (HOSPITAL_COMMUNITY): Admission: RE | Admit: 2015-05-19 | Payer: Medicare Other | Source: Ambulatory Visit

## 2015-05-19 ENCOUNTER — Encounter (HOSPITAL_COMMUNITY): Payer: Self-pay

## 2015-05-23 ENCOUNTER — Other Ambulatory Visit (HOSPITAL_COMMUNITY): Payer: Medicare Other

## 2015-05-26 ENCOUNTER — Ambulatory Visit (HOSPITAL_COMMUNITY)
Admission: RE | Admit: 2015-05-26 | Discharge: 2015-05-26 | Disposition: A | Discharge status: Home / Self Care | Payer: Medicare Other | Source: Ambulatory Visit | Attending: Cardiology | Admitting: Cardiology

## 2015-05-26 DIAGNOSIS — R072 Precordial pain: Secondary | ICD-10-CM | POA: Diagnosis not present

## 2015-05-26 DIAGNOSIS — R079 Chest pain, unspecified: Secondary | ICD-10-CM | POA: Insufficient documentation

## 2015-05-26 LAB — ECHOCARDIOGRAM STRESS TEST
CHL CUP MPHR: 174 {beats}/min
CHL CUP RESTING HR STRESS: 58 {beats}/min
CSEPPHR: 157 {beats}/min
Percent HR: 90 %

## 2015-05-26 MED ORDER — SODIUM CHLORIDE 0.9 % IJ SOLN
INTRAMUSCULAR | Status: AC
  Administered 2015-05-26: 10 mL via INTRAVENOUS
  Filled 2015-05-26: qty 3

## 2015-05-26 MED ORDER — ATROPINE SULFATE 1 MG/ML IJ SOLN
INTRAMUSCULAR | Status: AC
  Administered 2015-05-26: 1 mg via INTRAVENOUS
  Filled 2015-05-26: qty 1

## 2015-05-26 MED ORDER — DOBUTAMINE IN D5W 4-5 MG/ML-% IV SOLN
INTRAVENOUS | Status: AC
  Administered 2015-05-26: 10 ug via INTRAVENOUS
  Filled 2015-05-26: qty 250

## 2015-07-22 ENCOUNTER — Telehealth: Payer: Self-pay | Admitting: Obstetrics & Gynecology

## 2015-07-22 NOTE — Telephone Encounter (Signed)
Pt requesting the name of the hormone medication Dr. Elonda Husky prescribed last year. Pt informed Estradiol cream prescribed 11/01/2014.

## 2015-08-26 ENCOUNTER — Other Ambulatory Visit: Payer: Self-pay | Admitting: Family Medicine

## 2015-09-24 ENCOUNTER — Telehealth: Payer: Self-pay | Admitting: Family Medicine

## 2015-09-24 ENCOUNTER — Other Ambulatory Visit: Payer: Self-pay | Admitting: Family Medicine

## 2015-09-24 DIAGNOSIS — Z96669 Presence of unspecified artificial ankle joint: Secondary | ICD-10-CM

## 2015-09-24 NOTE — Telephone Encounter (Signed)
[facility] 09/24/15 

## 2015-09-24 NOTE — Telephone Encounter (Signed)
Patient requesting paper work for a referral to her orthopedic doctor in [place]. She has appointment on 09/30/15 and needing the paper work sent over.

## 2015-09-24 NOTE — Telephone Encounter (Signed)
Please put in for referral and send any necessary information/paperwork-if possible find out from the patient who she will be seeing and the reason why in order to put that in with the referral

## 2015-09-25 NOTE — Telephone Encounter (Signed)
Spoke with patient and patient stated that she needs the referral for Orthopedic specialist Dr.Aaron Scott in [facility] for follow up for ankle replacement done by Dr.Aaron Scott years back. Referral in epic

## 2015-09-25 NOTE — Addendum Note (Signed)
Addended by: Ofilia Neas R on: 09/25/2015 10:32 AM   Modules accepted: Orders

## 2015-09-29 ENCOUNTER — Telehealth: Payer: Self-pay | Admitting: Family Medicine

## 2015-09-29 DIAGNOSIS — M25559 Pain in unspecified hip: Secondary | ICD-10-CM
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
## 2015-09-29 NOTE — Telephone Encounter (Signed)
Referral ordered in EPIC. 

## 2015-09-29 NOTE — Telephone Encounter (Signed)
Please give referral to orthopedics [facility] for hip pain in hip problems thank you

## 2015-09-30 DIAGNOSIS — M19071 Primary osteoarthritis, right ankle and foot: Secondary | ICD-10-CM | POA: Diagnosis not present

## 2015-09-30 DIAGNOSIS — M12571 Traumatic arthropathy, right ankle and foot: Secondary | ICD-10-CM | POA: Diagnosis not present

## 2015-09-30 DIAGNOSIS — M25571 Pain in right ankle and joints of right foot: Secondary | ICD-10-CM | POA: Diagnosis not present

## 2015-10-10 DIAGNOSIS — S0083XA Contusion of other part of head, initial encounter: Secondary | ICD-10-CM | POA: Diagnosis not present

## 2015-10-10 DIAGNOSIS — G501 Atypical facial pain: Secondary | ICD-10-CM | POA: Diagnosis not present

## 2015-10-10 DIAGNOSIS — Z8619 Personal history of other infectious and parasitic diseases: Secondary | ICD-10-CM | POA: Diagnosis not present

## 2015-10-10 DIAGNOSIS — S8011XA Contusion of right lower leg, initial encounter: Secondary | ICD-10-CM | POA: Diagnosis not present

## 2015-10-10 DIAGNOSIS — S022XXA Fracture of nasal bones, initial encounter for closed fracture: Secondary | ICD-10-CM | POA: Diagnosis not present

## 2015-10-10 DIAGNOSIS — S0003XA Contusion of scalp, initial encounter: Secondary | ICD-10-CM | POA: Diagnosis not present

## 2015-10-10 DIAGNOSIS — Y929 Unspecified place or not applicable: Secondary | ICD-10-CM | POA: Diagnosis not present

## 2015-10-10 DIAGNOSIS — Z8719 Personal history of other diseases of the digestive system: Secondary | ICD-10-CM | POA: Diagnosis not present

## 2015-10-10 DIAGNOSIS — M79604 Pain in right leg: Secondary | ICD-10-CM | POA: Diagnosis not present

## 2015-10-10 DIAGNOSIS — Y939 Activity, unspecified: Secondary | ICD-10-CM | POA: Diagnosis not present

## 2015-10-10 DIAGNOSIS — Y999 Unspecified external cause status: Secondary | ICD-10-CM | POA: Diagnosis not present

## 2015-10-10 DIAGNOSIS — X58XXXA Exposure to other specified factors, initial encounter: Secondary | ICD-10-CM | POA: Diagnosis not present

## 2015-10-10 DIAGNOSIS — R220 Localized swelling, mass and lump, head: Secondary | ICD-10-CM | POA: Diagnosis not present

## 2015-10-10 DIAGNOSIS — S8991XA Unspecified injury of right lower leg, initial encounter: Secondary | ICD-10-CM | POA: Diagnosis not present

## 2015-10-11 DIAGNOSIS — S022XXA Fracture of nasal bones, initial encounter for closed fracture: Secondary | ICD-10-CM | POA: Diagnosis not present

## 2015-10-11 DIAGNOSIS — S0003XA Contusion of scalp, initial encounter: Secondary | ICD-10-CM | POA: Diagnosis not present

## 2015-10-11 DIAGNOSIS — S8991XA Unspecified injury of right lower leg, initial encounter: Secondary | ICD-10-CM | POA: Diagnosis not present

## 2015-10-11 DIAGNOSIS — R220 Localized swelling, mass and lump, head: Secondary | ICD-10-CM | POA: Diagnosis not present

## 2015-10-17 ENCOUNTER — Telehealth: Payer: Self-pay
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
## 2015-10-20 ENCOUNTER — Ambulatory Visit (INDEPENDENT_AMBULATORY_CARE_PROVIDER_SITE_OTHER): Payer: Medicare Other | Admitting: Family Medicine

## 2015-10-20 ENCOUNTER — Encounter: Payer: Self-pay | Admitting: Family Medicine

## 2015-10-20 VITALS — BP 110/70 | Ht 64.25 in | Wt 135.4 lb

## 2015-10-20 DIAGNOSIS — S0093XD Contusion of unspecified part of head, subsequent encounter: Secondary | ICD-10-CM
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
## 2015-10-27 ENCOUNTER — Other Ambulatory Visit: Payer: Self-pay | Admitting: Family Medicine

## 2015-11-26 ENCOUNTER — Other Ambulatory Visit: Payer: Self-pay | Admitting: Family Medicine

## 2015-12-11 DIAGNOSIS — M25562 Pain in left knee: Secondary | ICD-10-CM | POA: Diagnosis not present

## 2015-12-11 DIAGNOSIS — S82142D Displaced bicondylar fracture of left tibia, subsequent encounter for closed fracture with routine healing: Secondary | ICD-10-CM | POA: Diagnosis not present

## 2015-12-11 DIAGNOSIS — Z96642 Presence of left artificial hip joint: Secondary | ICD-10-CM | POA: Diagnosis not present

## 2015-12-11 DIAGNOSIS — T8484XA Pain due to internal orthopedic prosthetic devices, implants and grafts, initial encounter: Secondary | ICD-10-CM | POA: Diagnosis not present

## 2015-12-22 ENCOUNTER — Ambulatory Visit: Payer: Medicare Other | Admitting: Family Medicine

## 2016-01-01 ENCOUNTER — Ambulatory Visit (INDEPENDENT_AMBULATORY_CARE_PROVIDER_SITE_OTHER): Payer: Medicare Other | Admitting: Family Medicine

## 2016-01-01 ENCOUNTER — Encounter: Payer: Self-pay | Admitting: Family Medicine

## 2016-01-01 VITALS — BP 132/80 | Ht 64.25 in | Wt 136.1 lb

## 2016-01-01 DIAGNOSIS — R070 Pain in throat: Secondary | ICD-10-CM

## 2016-01-01 DIAGNOSIS — K219 Gastro-esophageal reflux disease without esophagitis: Secondary | ICD-10-CM

## 2016-01-01 MED ORDER — PANTOPRAZOLE SODIUM 40 MG PO TBEC: 40.0000 mg | DELAYED_RELEASE_TABLET | Freq: Every day | ORAL | Status: DC

## 2016-01-01 NOTE — Progress Notes (Signed)
   Subjective:    Patient ID: Mackenzie Williams, female    DOB: Nov 20, 1970, 45 y.o.   MRN: [id number]  Gastroesophageal Reflux She complains of abdominal pain and nausea. She reports no chest pain or no coughing. Flatulence, weakness. Pertinent negatives include no fatigue. Treatments tried: Omperazole.     When patient does not take her PPI she has heartburn and reflux symptoms when she takes her PPI she actually feels much better in regards to reflux symptoms she is noted some belching and feeling queasy at times  Review of Systems  Constitutional: Negative for fever, activity change and fatigue.  Respiratory: Negative for cough and shortness of breath.   Cardiovascular: Negative for chest pain and leg swelling.  Gastrointestinal: Positive for nausea and abdominal pain. Negative for blood in stool and abdominal distention.  Neurological: Negative for headaches.       Objective:   Physical Exam  Constitutional: She appears well-nourished. No distress.  Cardiovascular: Normal rate, regular rhythm and normal heart sounds.   No murmur heard. Pulmonary/Chest: Effort normal and breath sounds normal. No respiratory distress.  Musculoskeletal: She exhibits no edema.  Lymphadenopathy:    She has no cervical adenopathy.  Neurological: She is alert. She exhibits normal muscle tone.  Psychiatric: Her behavior is normal.  Vitals reviewed.         Assessment & Plan:  Laryngeal discomfort go see ENT referral and process patient has history of smoking  Laryngeal discomfort could be related to reflux  Having side effects with omeprazole tried protonic's if this doesn't help notify us   If she does not do better with protonic she is let us know. She is to follow-up if any ongoing troubles

## 2016-01-16 ENCOUNTER — Other Ambulatory Visit: Payer: Self-pay | Admitting: Obstetrics & Gynecology

## 2016-01-19 ENCOUNTER — Telehealth: Payer: Self-pay | Admitting: Obstetrics & Gynecology

## 2016-01-19 MED ORDER — VALACYCLOVIR HCL 500 MG PO TABS: 500.0000 mg | ORAL_TABLET | Freq: Every day | ORAL | 11 refills | Status: DC

## 2016-01-19 NOTE — Telephone Encounter (Signed)
Pt informed Valtrex e-scribed.

## 2016-02-02 ENCOUNTER — Ambulatory Visit (INDEPENDENT_AMBULATORY_CARE_PROVIDER_SITE_OTHER): Payer: Medicare Other | Admitting: Otolaryngology

## 2016-02-02 ENCOUNTER — Telehealth: Payer: Self-pay | Admitting: Family Medicine

## 2016-02-02 MED ORDER — OMEPRAZOLE 20 MG PO CPDR: 20.0000 mg | DELAYED_RELEASE_CAPSULE | Freq: Every day | ORAL | 4 refills | Status: DC

## 2016-02-02 NOTE — Telephone Encounter (Signed)
Patient wants to know if Dr. Nicki Reaper will send in Rx for omeprazole.  She says the pantoprazole is too strong for her and makes her sick.  Please advise.  Also, she wants to know if on days that she needs it, could she take a half of the omeprazole OTC to make it 30 mg?   [place] [facility]

## 2016-02-02 NOTE — Telephone Encounter (Signed)
She has options. One option omeprazole 20 mg daily other option omeprazole 40. I believe 20 mg covered by her insurance if so #30 one daily 4 refills

## 2016-02-02 NOTE — Telephone Encounter (Signed)
Notified patient she has options. One option omeprazole 20 mg daily other option omeprazole 40. Dr. Nicki Reaper believes 20 mg covered by her insurance. Patient verbalized understanding and Omeprazole 20 mg was sent to pharmacy per patient request.

## 2016-03-03 DIAGNOSIS — H04123 Dry eye syndrome of bilateral lacrimal glands: Secondary | ICD-10-CM | POA: Diagnosis not present

## 2016-03-25 ENCOUNTER — Other Ambulatory Visit: Payer: Self-pay | Admitting: Nurse Practitioner

## 2016-04-16 ENCOUNTER — Ambulatory Visit (INDEPENDENT_AMBULATORY_CARE_PROVIDER_SITE_OTHER): Payer: Medicare Other | Admitting: Family Medicine

## 2016-04-16 VITALS — Temp 98.9°F | Wt 122.0 lb

## 2016-04-16 DIAGNOSIS — R51 Headache: Secondary | ICD-10-CM | POA: Diagnosis not present

## 2016-04-16 DIAGNOSIS — R519 Headache, unspecified: Secondary | ICD-10-CM

## 2016-04-16 DIAGNOSIS — R5383 Other fatigue: Secondary | ICD-10-CM | POA: Diagnosis not present

## 2016-04-16 DIAGNOSIS — R634 Abnormal weight loss: Secondary | ICD-10-CM | POA: Diagnosis not present

## 2016-04-16 DIAGNOSIS — R1084 Generalized abdominal pain: Secondary | ICD-10-CM

## 2016-04-16 MED ORDER — BACLOFEN 10 MG PO TABS: ORAL_TABLET | ORAL | 1 refills | Status: DC

## 2016-04-16 MED ORDER — FLUTICASONE PROPIONATE 50 MCG/ACT NA SUSP: NASAL | 3 refills | Status: DC

## 2016-04-16 NOTE — Progress Notes (Signed)
Frequent Headaches-patient relates that she gets frequent headaches sometimes 2 or 3 times a week sometimes every day 10 to be mild headache no vomiting double vision does not wake her up at night. She takes ibuprofen frequently and sometimes Tylenol frequently she also uses a fair amount of caffeine she does not use any other medicines. Does not have a history headaches but this been gone ongoing over the past several months   Weight Loss she states she tries to eat healthy but she also relates at times she doesn't feel hungry so she doesn't eat she has noted that she is [employer] she denies any chest pain cough wheezing shortness breath she denies night sweats fever chills denies abdominal pain vomiting or diarrhea denies rectal bleeding Family history reviewed  Patient also relates intermittent abdominal discomforts that feel like a cramp of gas but there is no true pain with it.     Assessment and plan weight loss fatigue frequent headaches-headache questionnaire given, headache diary given, importance of patient using muscle relaxer when necessary for severe headache, back off on ibuprofen, do lab work, follow-up in 3 weeks' time, follow-up sooner problems. 25 minutes spent with patient.

## 2016-04-17 LAB — CBC WITH DIFFERENTIAL/PLATELET
Basophils Absolute: 0 10*3/uL (ref 0.0–0.2)
Basos: 0 %
EOS (ABSOLUTE): 0.3 10*3/uL (ref 0.0–0.4)
EOS: 3 %
HEMATOCRIT: 42.4 % (ref 34.0–46.6)
HEMOGLOBIN: 14.1 g/dL (ref 11.1–15.9)
IMMATURE GRANULOCYTES: 0 %
Immature Grans (Abs): 0 10*3/uL (ref 0.0–0.1)
LYMPHS: 41 %
Lymphocytes Absolute: 3.2 10*3/uL — ABNORMAL HIGH (ref 0.7–3.1)
MCH: 31.8 pg (ref 26.6–33.0)
MCHC: 33.3 g/dL (ref 31.5–35.7)
MCV: 96 fL (ref 79–97)
MONOCYTES: 8 %
MONOS ABS: 0.6 10*3/uL (ref 0.1–0.9)
NEUTROS PCT: 48 %
Neutrophils Absolute: 3.7 10*3/uL (ref 1.4–7.0)
Platelets: 243 10*3/uL (ref 150–379)
RBC: 4.44 x10E6/uL (ref 3.77–5.28)
RDW: 14.2 % (ref 12.3–15.4)
WBC: 7.9 10*3/uL (ref 3.4–10.8)

## 2016-04-17 LAB — BASIC METABOLIC PANEL
BUN/Creatinine Ratio: 24 — ABNORMAL HIGH (ref 9–23)
BUN: 18 mg/dL (ref 6–24)
CALCIUM: 10 mg/dL (ref 8.7–10.2)
CO2: 28 mmol/L (ref 18–29)
Chloride: 104 mmol/L (ref 96–106)
Creatinine, Ser: 0.75 mg/dL (ref 0.57–1.00)
GFR, EST AFRICAN AMERICAN: 110 mL/min/{1.73_m2} (ref >59)
GFR, EST NON AFRICAN AMERICAN: 95 mL/min/{1.73_m2} (ref >59)
Glucose: 91 mg/dL (ref 65–99)
POTASSIUM: 4.4 mmol/L (ref 3.5–5.2)
Sodium: 143 mmol/L (ref 134–144)

## 2016-04-17 LAB — HEPATIC FUNCTION PANEL
ALBUMIN: 4.5 g/dL (ref 3.5–5.5)
ALT: 20 IU/L (ref 0–32)
AST: 20 IU/L (ref 0–40)
Alkaline Phosphatase: 57 IU/L (ref 39–117)
BILIRUBIN TOTAL: 0.9 mg/dL (ref 0.0–1.2)
Bilirubin, Direct: 0.24 mg/dL (ref 0.00–0.40)
Total Protein: 7.1 g/dL (ref 6.0–8.5)

## 2016-04-17 LAB — LIPASE: LIPASE: 59 U/L (ref 14–72)

## 2016-04-17 LAB — TSH: TSH: 3.38 u[IU]/mL (ref 0.450–4.500)

## 2016-04-17 LAB — CORTISOL: Cortisol: 8.4 ug/dL

## 2016-04-23 LAB — BASIC METABOLIC PANEL: GLUCOSE: 90 mg/dL

## 2016-04-26 ENCOUNTER — Telehealth: Payer: Self-pay | Admitting: Family Medicine

## 2016-04-26 NOTE — Telephone Encounter (Signed)
Pt called stating that she is constipated and has taken laxatives and has yet to have a bowel movement. Please advise.    [facility] [place]

## 2016-04-26 NOTE — Telephone Encounter (Signed)
I would recommend she get some MiraLAX. I would do one capful in a glass of water once or twice daily until she gets results-notify us if not doing better over the next 7 days

## 2016-04-26 NOTE — Telephone Encounter (Signed)
Notified patient Dr. Nicki Reaper recommends she get some MiraLAX. Do one capful in a glass of water once or twice daily until she gets results-notify us if not doing better over the next 7 days. Patient verbalized understanding.

## 2016-04-26 NOTE — Telephone Encounter (Signed)
Patient has been constipated for 2 days now. Hard, big balls noted. No fever or blood noted. Patient has taken a chocolate laxative 15 mg and 3 tablets of bisacodyl 5 mg.

## 2016-05-04 ENCOUNTER — Ambulatory Visit: Payer: Medicare Other | Admitting: Family Medicine

## 2016-05-19 ENCOUNTER — Ambulatory Visit: Payer: Medicare Other | Admitting: Family Medicine

## 2016-06-03 ENCOUNTER — Encounter: Payer: Self-pay | Admitting: Family Medicine

## 2016-06-03 ENCOUNTER — Ambulatory Visit (INDEPENDENT_AMBULATORY_CARE_PROVIDER_SITE_OTHER): Payer: Medicare Other | Admitting: Family Medicine

## 2016-06-03 VITALS — BP 108/64 | Temp 98.8°F | Ht 64.0 in | Wt 126.0 lb

## 2016-06-03 DIAGNOSIS — R51 Headache: Secondary | ICD-10-CM

## 2016-06-03 DIAGNOSIS — R519 Headache, unspecified: Secondary | ICD-10-CM

## 2016-06-03 MED ORDER — TOPIRAMATE 25 MG PO TABS: 25.0000 mg | ORAL_TABLET | Freq: Two times a day (BID) | ORAL | 3 refills | Status: DC

## 2016-06-03 MED ORDER — TIZANIDINE HCL 2 MG PO CAPS
ORAL_CAPSULE | ORAL | 2 refills | Status: DC
Start: 2016-06-03 — End: 2016-10-05

## 2016-06-03 NOTE — Progress Notes (Signed)
   Subjective:    Patient ID: Mackenzie Williams, female    DOB: 15-Mar-1971, 45 y.o.   MRN: [id number]  Headache   Episode onset: close to one year. The problem occurs daily. Pertinent negatives include no abdominal pain or back pain. Treatments tried: baclofen. The treatment provided no relief.   This patient relates frequent headaches. She brings in a form that she filled out along with headache diary which shows frequent headaches over the past month and a half she gets headaches at least half the days he states she tried a muscle relaxer did not help made her feel drowsy. She denies abusing caffeine she is been cutting back. Patient does smoke. She states occasionally the headaches wake her up at night but there is no vomiting or blurred vision with it. She denies any numbness or tingling.   Review of Systems  Gastrointestinal: Negative for abdominal pain.  Musculoskeletal: Negative for back pain.  Neurological: Positive for headaches.       Objective:   Physical Exam  Constitutional: She appears well-nourished. No distress.  HENT:  Head: Normocephalic.  Cardiovascular: Normal rate, regular rhythm and normal heart sounds.   No murmur heard. Pulmonary/Chest: Effort normal and breath sounds normal.  Musculoskeletal: She exhibits no edema.  Lymphadenopathy:    She has no cervical adenopathy.  Neurological: She is alert.  Psychiatric: Her behavior is normal.  Vitals reviewed.         Assessment & Plan:  Neurologic exam grossly normal Frequent headaches After long discussion patient agrees to try Topamax if this does not dramatically help she might have to try other measures. We'll try different muscle relaxer she can use when the headache is severe when she's at home I've encouraged her to avoid anti-inflammatories and Tylenol to try to minimize severe rebound headaches Patient was encouraged quit smoking in minimize caffeine Referral to neurology for further evaluation  As  a late discussion patient brings up that she has chronic ongoing abdominal pain I encouraged patient to do a follow-up visit for the

## 2016-06-03 NOTE — Patient Instructions (Signed)
We will set you an appointment with neurology  Use muscle relaxer only when needed for the headache caution drowsiness-home use only

## 2016-06-04 ENCOUNTER — Encounter: Payer: Self-pay | Admitting: Family Medicine

## 2016-06-10 ENCOUNTER — Ambulatory Visit (INDEPENDENT_AMBULATORY_CARE_PROVIDER_SITE_OTHER): Payer: Medicare Other | Admitting: Otolaryngology

## 2016-06-10 DIAGNOSIS — F17210 Nicotine dependence, cigarettes, uncomplicated: Secondary | ICD-10-CM | POA: Diagnosis not present

## 2016-06-10 DIAGNOSIS — R070 Pain in throat: Secondary | ICD-10-CM

## 2016-08-16 ENCOUNTER — Ambulatory Visit (INDEPENDENT_AMBULATORY_CARE_PROVIDER_SITE_OTHER): Payer: Medicare Other | Admitting: Family Medicine

## 2016-08-16 ENCOUNTER — Telehealth: Payer: Self-pay | Admitting: Family Medicine

## 2016-08-16 ENCOUNTER — Encounter: Payer: Self-pay | Admitting: Family Medicine

## 2016-08-16 VITALS — BP 118/82 | Temp 98.2°F | Ht 64.0 in | Wt 122.0 lb

## 2016-08-16 DIAGNOSIS — L309 Dermatitis, unspecified: Secondary | ICD-10-CM

## 2016-08-16 MED ORDER — TRIAMCINOLONE ACETONIDE 0.1 % EX CREA: 1.0000 "application " | TOPICAL_CREAM | Freq: Two times a day (BID) | CUTANEOUS | 4 refills | Status: DC

## 2016-08-16 NOTE — Progress Notes (Signed)
   Subjective:    Patient ID: Mackenzie Williams, female    DOB: Oct 28, 1970, 47 y.o.   MRN: [id number]  Rash  This is a new problem. Episode onset: more than 6 months. Location: rash, itcing and burning under arms.  Treatments tried: sensitive deoraurant, soaps,  Patient with 56 months of erythematous rash underneath the arms itches. Denies drainage pus drainage denies swelling. Denies anywhere else having this trouble. Denies fever chills sweats is tried different deodorants without much success    Review of Systems  Skin: Positive for rash.  No fever cough vomiting or shortness of breath     Objective:   Physical Exam  Lungs clear hearts regular erythematous rash noted underneath the arms consistent with contact dermatitis      Assessment & Plan:  Contact dermatitis Steroid cream If not dramatically better over the next 2 weeks call us and we will set up appointment with dermatology

## 2016-08-16 NOTE — Telephone Encounter (Signed)
ERROR

## 2016-08-18 ENCOUNTER — Ambulatory Visit: Payer: Medicare Other | Admitting: Neurology

## 2016-08-25 ENCOUNTER — Ambulatory Visit: Payer: Medicare Other | Admitting: Neurology

## 2016-08-31 ENCOUNTER — Encounter: Payer: Medicare Other | Admitting: Family Medicine

## 2016-09-22 ENCOUNTER — Encounter: Payer: Medicare Other | Admitting: Family Medicine

## 2016-09-23 ENCOUNTER — Encounter: Payer: Self-pay | Admitting: Family Medicine

## 2016-09-30 ENCOUNTER — Ambulatory Visit (INDEPENDENT_AMBULATORY_CARE_PROVIDER_SITE_OTHER): Payer: Medicare Other | Admitting: Otolaryngology

## 2016-09-30 DIAGNOSIS — R070 Pain in throat: Secondary | ICD-10-CM | POA: Diagnosis not present

## 2016-09-30 DIAGNOSIS — K219 Gastro-esophageal reflux disease without esophagitis: Secondary | ICD-10-CM

## 2016-09-30 DIAGNOSIS — F17210 Nicotine dependence, cigarettes, uncomplicated: Secondary | ICD-10-CM | POA: Diagnosis not present

## 2016-09-30 DIAGNOSIS — R490 Dysphonia: Secondary | ICD-10-CM

## 2016-10-05 ENCOUNTER — Encounter: Payer: Self-pay | Admitting: Family Medicine

## 2016-10-05 ENCOUNTER — Ambulatory Visit (INDEPENDENT_AMBULATORY_CARE_PROVIDER_SITE_OTHER): Payer: Medicare Other | Admitting: Family Medicine

## 2016-10-05 VITALS — BP 118/82 | Ht 63.0 in | Wt 123.6 lb

## 2016-10-05 DIAGNOSIS — K5900 Constipation, unspecified: Secondary | ICD-10-CM

## 2016-10-05 DIAGNOSIS — A6000 Herpesviral infection of urogenital system, unspecified: Secondary | ICD-10-CM | POA: Diagnosis not present

## 2016-10-05 DIAGNOSIS — Z13220 Encounter for screening for lipoid disorders: Secondary | ICD-10-CM | POA: Diagnosis not present

## 2016-10-05 DIAGNOSIS — R109 Unspecified abdominal pain: Secondary | ICD-10-CM

## 2016-10-05 MED ORDER — PANTOPRAZOLE SODIUM 40 MG PO TBEC: 40.0000 mg | DELAYED_RELEASE_TABLET | Freq: Every day | ORAL | 3 refills | Status: DC

## 2016-10-05 MED ORDER — TOPIRAMATE 25 MG PO TABS: 25.0000 mg | ORAL_TABLET | Freq: Two times a day (BID) | ORAL | 4 refills | Status: DC
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
## 2016-10-05 NOTE — Patient Instructions (Signed)
Topamax  Start one each evening for the first week  Then one twice a day  Recheck in 5 to 6 weeks

## 2016-10-06 NOTE — Progress Notes (Signed)
It looks correct to me. You discussed all aspects of the wellness exam and then covered some other chronic/acute issues. I see no reason as to why you could not code it as a 99213 and Medicare Wellness.

## 2016-10-08 ENCOUNTER — Telehealth: Payer: Self-pay | Admitting: Family Medicine

## 2016-10-08 DIAGNOSIS — E785 Hyperlipidemia, unspecified: Secondary | ICD-10-CM | POA: Diagnosis not present

## 2016-10-08 DIAGNOSIS — Z13220 Encounter for screening for lipoid disorders: Secondary | ICD-10-CM | POA: Diagnosis not present

## 2016-10-08 DIAGNOSIS — K5900 Constipation, unspecified: Secondary | ICD-10-CM | POA: Diagnosis not present

## 2016-10-08 DIAGNOSIS — Z79899 Other long term (current) drug therapy: Secondary | ICD-10-CM | POA: Diagnosis not present

## 2016-10-08 DIAGNOSIS — R109 Unspecified abdominal pain: Secondary | ICD-10-CM | POA: Diagnosis not present

## 2016-10-08 NOTE — Telephone Encounter (Signed)
Pt called stating that she needs a letter stating that she is disabled. Pt is needing this as soon as possible.

## 2016-10-09 LAB — BASIC METABOLIC PANEL
BUN/Creatinine Ratio: 21 (ref 9–23)
BUN: 14 mg/dL (ref 6–24)
CALCIUM: 9.8 mg/dL (ref 8.7–10.2)
CHLORIDE: 104 mmol/L (ref 96–106)
CO2: 24 mmol/L (ref 18–29)
Creatinine, Ser: 0.68 mg/dL (ref 0.57–1.00)
GFR calc Af Amer: 120 mL/min/{1.73_m2} (ref >59)
GFR calc non Af Amer: 104 mL/min/{1.73_m2} (ref >59)
Glucose: 83 mg/dL (ref 65–99)
POTASSIUM: 4.3 mmol/L (ref 3.5–5.2)
Sodium: 143 mmol/L (ref 134–144)

## 2016-10-09 LAB — LIPID PANEL
CHOL/HDL RATIO: 2.5 ratio (ref 0.0–4.4)
CHOLESTEROL TOTAL: 227 mg/dL — AB (ref 100–199)
HDL: 92 mg/dL (ref >39)
LDL Calculated: 124 mg/dL — ABNORMAL HIGH (ref 0–99)
Triglycerides: 54 mg/dL (ref 0–149)
VLDL Cholesterol Cal: 11 mg/dL (ref 5–40)

## 2016-10-09 LAB — CBC WITH DIFFERENTIAL/PLATELET
BASOS ABS: 0 10*3/uL (ref 0.0–0.2)
Basos: 1 %
EOS (ABSOLUTE): 0.3 10*3/uL (ref 0.0–0.4)
Eos: 4 %
HEMATOCRIT: 42.8 % (ref 34.0–46.6)
HEMOGLOBIN: 14.2 g/dL (ref 11.1–15.9)
Immature Grans (Abs): 0 10*3/uL (ref 0.0–0.1)
Immature Granulocytes: 0 %
LYMPHS ABS: 3.2 10*3/uL — AB (ref 0.7–3.1)
Lymphs: 47 %
MCH: 30.7 pg (ref 26.6–33.0)
MCHC: 33.2 g/dL (ref 31.5–35.7)
MCV: 93 fL (ref 79–97)
Monocytes Absolute: 0.6 10*3/uL (ref 0.1–0.9)
Monocytes: 9 %
NEUTROS ABS: 2.6 10*3/uL (ref 1.4–7.0)
Neutrophils: 39 %
Platelets: 257 10*3/uL (ref 150–379)
RBC: 4.62 x10E6/uL (ref 3.77–5.28)
RDW: 14.3 % (ref 12.3–15.4)
WBC: 6.7 10*3/uL (ref 3.4–10.8)

## 2016-10-09 LAB — HEPATIC FUNCTION PANEL
ALBUMIN: 4.3 g/dL (ref 3.5–5.5)
ALT: 17 IU/L (ref 0–32)
AST: 18 IU/L (ref 0–40)
Alkaline Phosphatase: 68 IU/L (ref 39–117)
BILIRUBIN TOTAL: 1.5 mg/dL — AB (ref 0.0–1.2)
Bilirubin, Direct: 0.31 mg/dL (ref 0.00–0.40)
TOTAL PROTEIN: 7 g/dL (ref 6.0–8.5)

## 2016-10-09 LAB — LIPASE: Lipase: 43 U/L (ref 14–72)

## 2016-10-10 ENCOUNTER — Encounter: Payer: Self-pay | Admitting: Family Medicine

## 2016-10-10 NOTE — Telephone Encounter (Signed)
Erica- I would like for this patient to submit some simple writings/sentences stating what's her main reasons for disability so that my letter can accurately reflect what keeps her from being able to work

## 2016-10-11 NOTE — Telephone Encounter (Signed)
Notified patient.

## 2016-10-15 ENCOUNTER — Ambulatory Visit: Payer: Medicare Other | Admitting: Neurology

## 2016-10-15 ENCOUNTER — Other Ambulatory Visit: Payer: Self-pay | Admitting: *Deleted

## 2016-10-15 DIAGNOSIS — R109 Unspecified abdominal pain: Secondary | ICD-10-CM

## 2016-10-15 DIAGNOSIS — K5900 Constipation, unspecified: Secondary | ICD-10-CM

## 2016-10-15 LAB — IFOBT (OCCULT BLOOD): IMMUNOLOGICAL FECAL OCCULT BLOOD TEST: NEGATIVE

## 2016-10-17 ENCOUNTER — Encounter: Payer: Self-pay | Admitting: Family Medicine

## 2016-10-26 ENCOUNTER — Ambulatory Visit: Payer: Medicare Other | Admitting: Neurology

## 2016-10-28 DIAGNOSIS — T1490XS Injury, unspecified, sequela: Secondary | ICD-10-CM | POA: Diagnosis not present

## 2016-10-28 DIAGNOSIS — M19071 Primary osteoarthritis, right ankle and foot: Secondary | ICD-10-CM | POA: Diagnosis not present

## 2016-10-28 DIAGNOSIS — Z96661 Presence of right artificial ankle joint: Secondary | ICD-10-CM | POA: Diagnosis not present

## 2016-10-28 DIAGNOSIS — M12571 Traumatic arthropathy, right ankle and foot: Secondary | ICD-10-CM | POA: Diagnosis not present

## 2016-10-28 DIAGNOSIS — M19171 Post-traumatic osteoarthritis, right ankle and foot: Secondary | ICD-10-CM | POA: Diagnosis not present

## 2016-11-02 ENCOUNTER — Encounter: Payer: Self-pay | Admitting: Family Medicine

## 2016-11-02 ENCOUNTER — Telehealth: Payer: Self-pay | Admitting: Family Medicine

## 2016-11-02 NOTE — Telephone Encounter (Signed)
Pt is needing a letter stating that a dog would be great company for the patient. The pt states that her landlord is requiring a letter from her dr in order for it to not cost her 200 a month. Pt is needing this by Friday. Please advise,

## 2016-11-02 NOTE — Telephone Encounter (Signed)
I did dictate a letter if it needs to say something different please let me know have the patient review it

## 2016-11-12 ENCOUNTER — Ambulatory Visit: Payer: Medicare Other | Admitting: Family Medicine

## 2016-12-29 ENCOUNTER — Other Ambulatory Visit: Payer: Self-pay | Admitting: Women's Health

## 2017-01-03 ENCOUNTER — Ambulatory Visit (INDEPENDENT_AMBULATORY_CARE_PROVIDER_SITE_OTHER): Payer: Medicare Other | Admitting: Otolaryngology

## 2017-01-14 ENCOUNTER — Other Ambulatory Visit (HOSPITAL_COMMUNITY)
Admission: RE | Admit: 2017-01-14 | Discharge: 2017-01-14 | Disposition: A | Discharge status: Home / Self Care | Payer: Medicare Other | Source: Ambulatory Visit | Attending: Obstetrics and Gynecology | Admitting: Obstetrics and Gynecology

## 2017-01-14 ENCOUNTER — Ambulatory Visit (INDEPENDENT_AMBULATORY_CARE_PROVIDER_SITE_OTHER): Payer: Medicare Other | Admitting: Obstetrics and Gynecology

## 2017-01-14 ENCOUNTER — Encounter: Payer: Self-pay | Admitting: Obstetrics and Gynecology

## 2017-01-14 VITALS — BP 106/64 | HR 81 | Ht 64.0 in | Wt 122.6 lb

## 2017-01-14 DIAGNOSIS — Z113 Encounter for screening for infections with a predominantly sexual mode of transmission: Secondary | ICD-10-CM | POA: Insufficient documentation

## 2017-01-14 DIAGNOSIS — Z01419 Encounter for gynecological examination (general) (routine) without abnormal findings: Secondary | ICD-10-CM

## 2017-01-14 DIAGNOSIS — Z124 Encounter for screening for malignant neoplasm of cervix: Secondary | ICD-10-CM | POA: Insufficient documentation

## 2017-01-14 MED ORDER — METRONIDAZOLE 500 MG PO TABS: 500.0000 mg | ORAL_TABLET | Freq: Two times a day (BID) | ORAL | 0 refills | Status: AC

## 2017-01-14 NOTE — Progress Notes (Signed)
Patient ID: Mackenzie Williams, female   DOB: [birth year], 46 y.o.   MRN: [id number]  Assessment:  Annual Gyn Exam   Plan:  1. pap smear done, next pap due 3 years 2. GC/Chlamydia and wet prep completed in office today 3. HIV and RPR labs drawn today 4. Flagyl Rx x 7 days BID  5. return annually or prn 6    Annual mammogram advised Subjective:  Mackenzie Williams is a 46 y.o. female G2P0020 who presents for annual exam. No LMP recorded. Patient is not currently having periods (Reason: Irregular Periods). The patient has complaints today of STD screening and is requesting a pap smear. Pt reports 1 new sex partner. She reports associated burning sensation to her vaginal region. Pt voices concern for vaginitis today and would like evaluation and treatment. Hasn't tried any medications for the relief of her symptoms. Denies any other symptoms.  The following portions of the patient's history were reviewed and updated as appropriate: allergies, current medications, past family history, past medical history, past social history, past surgical history and problem list. Past Medical History:  Diagnosis Date  . Asthma   . COPD (chronic obstructive pulmonary disease) ([place])   . Esophageal hernia   . Genital herpes   . H. pylori infection 11/2011  . Leg pain, right   . Menorrhagia    [facility]  . MRSA carrier   . Neck pain   . Rosacea   . Seasonal allergies   . Varicose veins     Past Surgical History:  Procedure Laterality Date  . ANKLE SURGERY  1999  . CHOLECYSTECTOMY  04/07/2011   Dr Elenor Quinones dyskinesia  . ESOPHAGOGASTRODUODENOSCOPY  03/02/2012   RMR: small hiatal hernia. Gastric polyp and abnormal gastric mucosa of doubtfull clinical significance- status post biopsy (benign). No explanation for abdominal pain based on today's examination.   [name] [place]  2010  . HIP SURGERY    . UTERINE FIBROID SURGERY  2011  . UTERINE SEPTUM RESECTION  2011     Current  Outpatient Prescriptions:  .  fluticasone (FLONASE) 50 MCG/ACT nasal spray, USE TWO SPRAY(S) IN EACH NOSTRIL ONCE DAILY, Disp: 16 g, Rfl: 3 .  ranitidine (ZANTAC) 75 MG tablet, Take 75 mg by mouth 2 (two) times daily., Disp: , Rfl:  .  triamcinolone cream (KENALOG) 0.1 %, Apply 1 application topically 2 (two) times daily., Disp: 45 g, Rfl: 4 .  valACYclovir (VALTREX) 500 MG tablet, TAKE ONE TABLET BY MOUTH ONCE DAILY, Disp: 30 tablet, Rfl: 0 .  pantoprazole (PROTONIX) 40 MG tablet, Take 1 tablet (40 mg total) by mouth daily. (Patient not taking: Reported on 01/14/2017), Disp: 30 tablet, Rfl: 3 .  topiramate (TOPAMAX) 25 MG tablet, Take 1 tablet (25 mg total) by mouth 2 (two) times daily. (Patient not taking: Reported on 01/14/2017), Disp: 60 tablet, Rfl: 4  Review of Systems Constitutional: negative Gastrointestinal: negative Genitourinary: negative  Objective:  BP 106/64 (BP Location: Right Arm, Patient Position: Sitting, Cuff Size: Normal)   Pulse 81   Ht 5\' 4"  (1.626 m)   Wt 122 lb 9.6 oz (55.6 kg)   BMI 21.04 kg/m    BMI: Body mass index is 21.04 kg/m.  General Appearance: Alert, appropriate appearance for age. No acute distress HEENT: Grossly normal Neck / Thyroid:  Cardiovascular: RRR; normal S1, S2, no murmur Lungs: CTA bilaterally Back: No CVAT Breast Exam: No dimpling, nipple retraction or discharge. No masses or nodes., Normal to inspection,  Normal breast tissue bilaterally and No masses or nodes.No dimpling, nipple retraction or discharge. Gastrointestinal: Soft, non-tender, no masses or organomegaly Pelvic Exam: External genitalia: normal general appearance and abnormal pigmentation Vaginal: normal mucosa without prolapse or lesions, normal without tenderness, induration or masses and normal rugae Cervix: normal appearance Adnexa: normal bimanual exam Uterus: normal single, nontender and tiny Rectal: good sphincter tone, no masses and guaiac negative Small rectocele.   Rectovaginal: normal rectal, no masses Lymphatic Exam: Non-palpable nodes in neck, clavicular, axillary, or inguinal regions  Skin: no rash or abnormalities Neurologic: Normal gait and speech, no tremor  Psychiatric: Alert and oriented, appropriate affect.  Urinalysis:Not done  Mallory Shirk. MD Pgr [phone] 10:47 AM  By signing my name below, I, Mackenzie Williams, attest that this documentation has been prepared under the direction and in the presence of Jonnie Kind, MD. Electronically Signed: Soijett Williams, ED Scribe. 01/14/17. 9:39 AM. Extensive time required for patient care due to the patient's history and hesitancy to discuss matters that might be documented in chart. Patient has to consider making her chart XXX for her increased confidence and security. This is been done  I personally performed the services described in this documentation, which was SCRIBED in my presence. The recorded information has been reviewed and considered accurate. It has been edited as necessary during review. Jonnie Kind, MD

## 2017-01-18 LAB — CYTOLOGY - PAP
CHLAMYDIA, DNA PROBE: NEGATIVE
DIAGNOSIS: NEGATIVE
HPV: NOT DETECTED
Neisseria Gonorrhea: NEGATIVE

## 2017-02-10 ENCOUNTER — Other Ambulatory Visit: Payer: Self-pay | Admitting: Women's Health

## 2017-03-31 ENCOUNTER — Ambulatory Visit (INDEPENDENT_AMBULATORY_CARE_PROVIDER_SITE_OTHER): Payer: Medicare Other | Admitting: Obstetrics and Gynecology

## 2017-03-31 ENCOUNTER — Other Ambulatory Visit: Payer: Self-pay | Admitting: Obstetrics and Gynecology

## 2017-03-31 ENCOUNTER — Encounter (INDEPENDENT_AMBULATORY_CARE_PROVIDER_SITE_OTHER): Payer: Self-pay

## 2017-03-31 ENCOUNTER — Encounter: Payer: Self-pay | Admitting: Obstetrics and Gynecology

## 2017-03-31 VITALS — BP 142/80 | HR 68 | Ht 64.0 in | Wt 125.6 lb

## 2017-03-31 DIAGNOSIS — N812 Incomplete uterovaginal prolapse: Secondary | ICD-10-CM | POA: Diagnosis not present

## 2017-03-31 DIAGNOSIS — Z113 Encounter for screening for infections with a predominantly sexual mode of transmission: Secondary | ICD-10-CM

## 2017-03-31 NOTE — Progress Notes (Addendum)
[facility] Visit  03/31/2017            Patient name: MEGHIN THIVIERGE MRN [id number]  Date of birth: 1971-06-11  CC & HPI:  CATHARINA PICA is a 46 y.o. female presenting today for f/u on pelvic pain and abnormal discharge. Pt states last visit, she was told her uterus is dropping, and states it is bothering her. Pt states her last period was 3-4 weeks ago, and very heavy. She tolerated it fairly well, however did feel weak. Pt does not have her period for several months at a time. Pt had a dentist app this week and had to take ABx, and is concerned she might have vaginitis or a yeast infection. She has felt some burning and itching, with increased discharge since taking the Abx. Pt is not sexually active.  G2P0020  ROS:  ROS  -fever +increased discharge +vaginal itching All systems are negative except as noted in the HPI and PMH.    Pertinent History Reviewed:   Reviewed: Significant for genital herpes, menorrhagia, uterine fibroid surgery Medical         Past Medical History:  Diagnosis Date  . Asthma   . COPD (chronic obstructive pulmonary disease) ([place])   . Esophageal hernia   . Genital herpes   . H. pylori infection 11/2011  . Leg pain, right   . Menorrhagia    [facility]  . MRSA carrier   . Neck pain   . Rosacea   . Seasonal allergies   . Varicose veins                               Surgical Hx:    Past Surgical History:  Procedure Laterality Date  . ANKLE SURGERY  1999  . CHOLECYSTECTOMY  04/07/2011   Dr Elenor Quinones dyskinesia  . ESOPHAGOGASTRODUODENOSCOPY  03/02/2012   RMR: small hiatal hernia. Gastric polyp and abnormal gastric mucosa of doubtfull clinical significance- status post biopsy (benign). No explanation for abdominal pain based on today's examination.   [name] [place]  2010  . HIP SURGERY    . UTERINE FIBROID SURGERY  2011  . UTERINE SEPTUM RESECTION  2011   Medications: Reviewed & Updated - see associated  section                       Current Outpatient Prescriptions:  .  fluticasone (FLONASE) 50 MCG/ACT nasal spray, USE TWO SPRAY(S) IN EACH NOSTRIL ONCE DAILY, Disp: 16 g, Rfl: 3 .  pantoprazole (PROTONIX) 40 MG tablet, Take 1 tablet (40 mg total) by mouth daily. (Patient not taking: Reported on 01/14/2017), Disp: 30 tablet, Rfl: 3 .  ranitidine (ZANTAC) 75 MG tablet, Take 75 mg by mouth 2 (two) times daily., Disp: , Rfl:  .  topiramate (TOPAMAX) 25 MG tablet, Take 1 tablet (25 mg total) by mouth 2 (two) times daily. (Patient not taking: Reported on 01/14/2017), Disp: 60 tablet, Rfl: 4 .  triamcinolone cream (KENALOG) 0.1 %, Apply 1 application topically 2 (two) times daily., Disp: 45 g, Rfl: 4 .  valACYclovir (VALTREX) 500 MG tablet, TAKE ONE TABLET BY MOUTH ONCE DAILY, Disp: 30 tablet, Rfl: 0 .  valACYclovir (VALTREX) 500 MG tablet, TAKE ONE TABLET BY MOUTH ONCE DAILY, Disp: 30 tablet, Rfl: 11   Social History: Reviewed -  reports that she has been smoking Cigarettes.  She has a  5.00 pack-year smoking history. She has never used smokeless tobacco.  Objective Findings:  Vitals: There were no vitals taken for this visit.  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  P:  1. F/u PRN, return in 1 year for annual Paperwork for HIV, RPR, Hep C given to pt    By signing my name below, I, Izna Ahmed, attest that this documentation has been prepared under the direction and in the presence of Jonnie Kind, MD. Electronically Signed: Jabier Gauss, Medical Scribe. 03/31/17. 2:22 PM.  I personally performed the services described in this documentation, which was SCRIBED in my presence. The recorded information has been reviewed and considered accurate. It has been edited as necessary during review. Jonnie Kind, MD

## 2017-04-01 LAB — HEPATITIS C ANTIBODY: Hep C Virus Ab: <0.1 s/co ratio (ref 0.0–0.9)

## 2017-04-01 LAB — HIV ANTIBODY (ROUTINE TESTING W REFLEX): HIV Screen 4th Generation wRfx: NONREACTIVE

## 2017-04-01 LAB — RPR: RPR: NONREACTIVE

## 2017-04-05 ENCOUNTER — Telehealth: Payer: Self-pay | Admitting: *Deleted

## 2017-04-05 NOTE — Telephone Encounter (Signed)
Patient called requesting lab results. Informed not resulted. Will call [facility] to check on status.

## 2017-04-06 ENCOUNTER — Telehealth: Payer: Self-pay | Admitting: *Deleted

## 2017-04-06 NOTE — Telephone Encounter (Signed)
Pt informed that labs were negative.

## 2017-04-19 ENCOUNTER — Other Ambulatory Visit: Payer: Self-pay | Admitting: Family Medicine

## 2017-05-15 IMAGING — DX DG CHEST 2V
2 series · 2 of 2 positions shown · non-contrast
Comparison: PA and lateral chest x-ray November 04, 2011

CLINICAL DATA: Intermittent episodes of left-sided chest pain
associated with shortness of breath over the past 3 months; remote
history of previous episodes of bronchitis.

EXAM:
CHEST  2 VIEW

[chest pa]
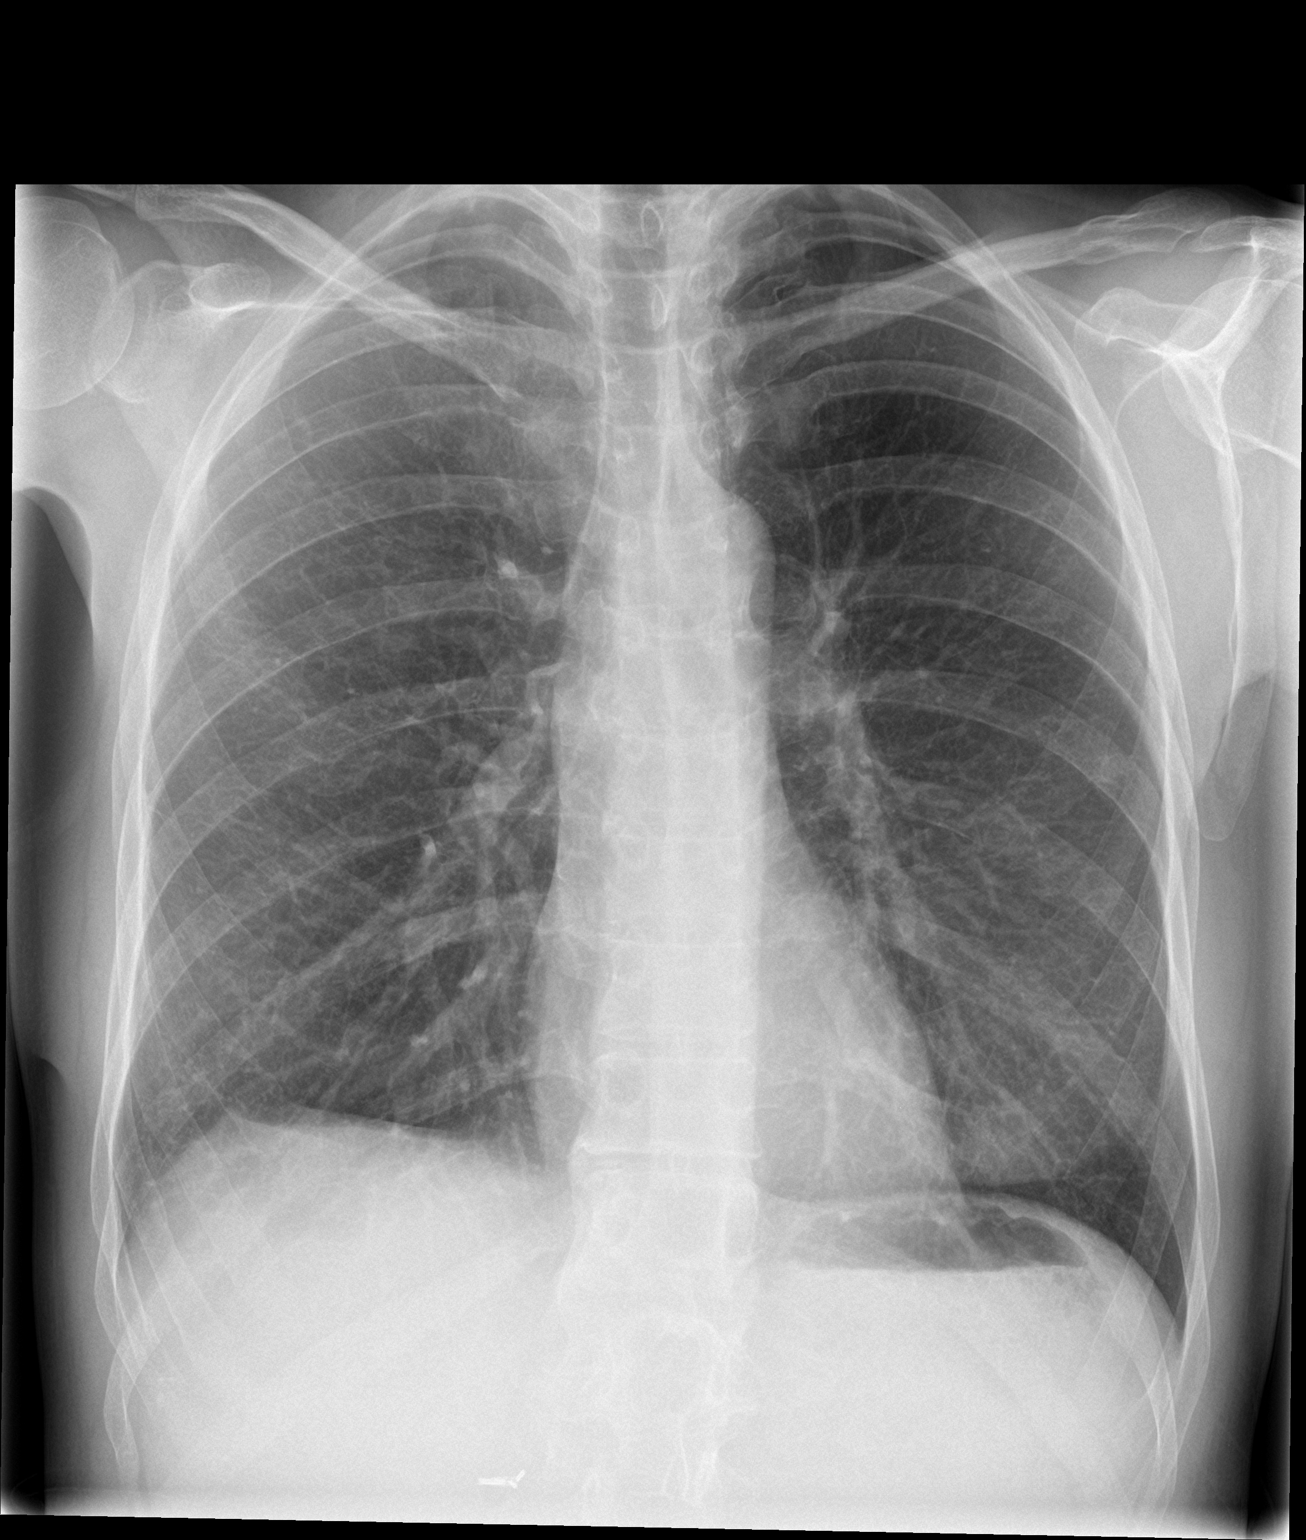

[chest lat]
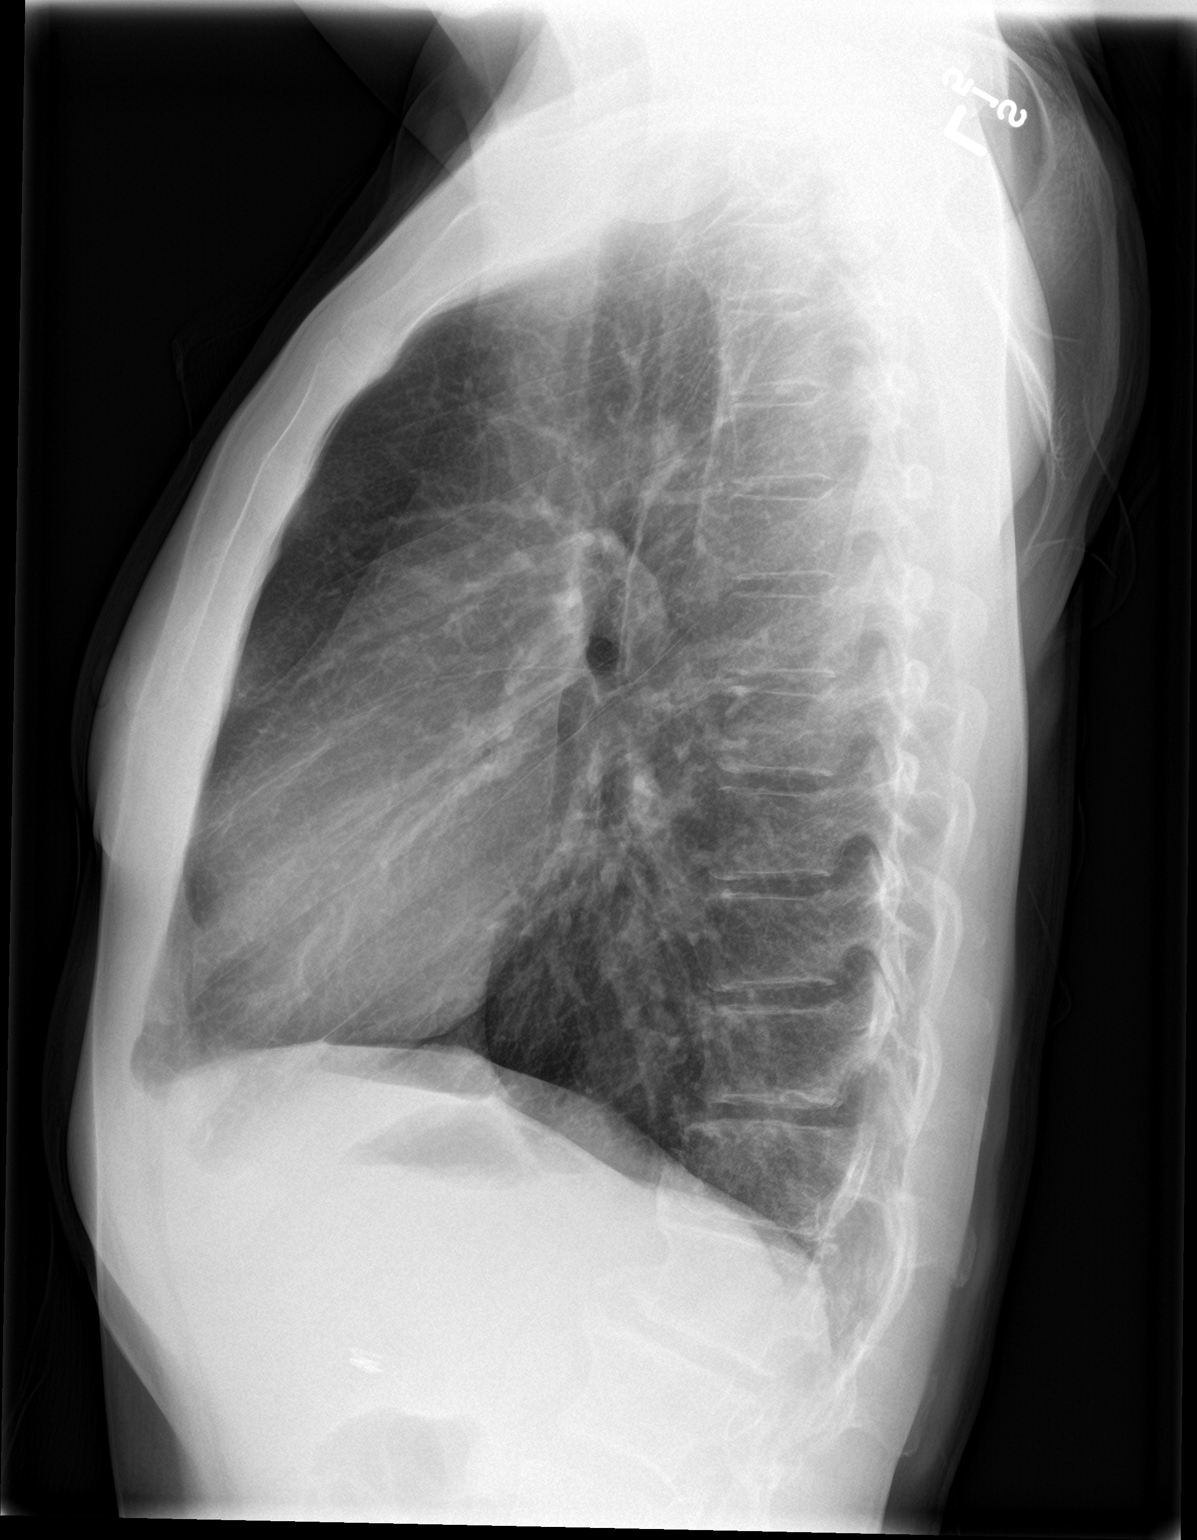

[2 of 2 positions shown; findings below may reference images not displayed]

FINDINGS: The lungs remain hyperinflated. There is no focal infiltrate. There
is no pleural effusion. No suspicious masses are observed. Subtle
nodularity is stable on the left projecting over the lateral aspect
of the eighth rib. The heart and pulmonary vascularity are normal.
The mediastinum is normal in width. The bony thorax exhibits no
acute abnormality.
IMPRESSION: Hyperinflation which may reflect COPD and the patient's smoking
history. There is no evidence of pneumonia, abnormal pulmonary
parenchymal masses, nor other acute cardiopulmonary abnormality.

## 2017-05-16 DIAGNOSIS — Z7689 Persons encountering health services in other specified circumstances: Secondary | ICD-10-CM | POA: Diagnosis not present

## 2017-05-16 DIAGNOSIS — M19171 Post-traumatic osteoarthritis, right ankle and foot: Secondary | ICD-10-CM | POA: Diagnosis not present

## 2017-05-16 DIAGNOSIS — M19071 Primary osteoarthritis, right ankle and foot: Secondary | ICD-10-CM | POA: Diagnosis not present

## 2017-05-16 DIAGNOSIS — Z79891 Long term (current) use of opiate analgesic: Secondary | ICD-10-CM | POA: Diagnosis not present

## 2017-07-01 DIAGNOSIS — S0993XA Unspecified injury of face, initial encounter: Secondary | ICD-10-CM | POA: Diagnosis not present

## 2017-07-01 DIAGNOSIS — S098XXA Other specified injuries of head, initial encounter: Secondary | ICD-10-CM | POA: Diagnosis not present

## 2017-07-01 DIAGNOSIS — S01412A Laceration without foreign body of left cheek and temporomandibular area, initial encounter: Secondary | ICD-10-CM | POA: Diagnosis not present

## 2017-07-01 DIAGNOSIS — R51 Headache: Secondary | ICD-10-CM | POA: Diagnosis not present

## 2017-07-01 DIAGNOSIS — W228XXA Striking against or struck by other objects, initial encounter: Secondary | ICD-10-CM | POA: Diagnosis not present

## 2017-07-01 DIAGNOSIS — S0181XA Laceration without foreign body of other part of head, initial encounter: Secondary | ICD-10-CM | POA: Diagnosis not present

## 2017-07-01 DIAGNOSIS — G4489 Other headache syndrome: Secondary | ICD-10-CM | POA: Diagnosis not present

## 2017-07-01 DIAGNOSIS — S0990XA Unspecified injury of head, initial encounter: Secondary | ICD-10-CM | POA: Diagnosis not present

## 2017-07-02 DIAGNOSIS — S0993XA Unspecified injury of face, initial encounter: Secondary | ICD-10-CM | POA: Diagnosis not present

## 2017-07-02 DIAGNOSIS — S0990XA Unspecified injury of head, initial encounter: Secondary | ICD-10-CM | POA: Diagnosis not present

## 2017-07-07 ENCOUNTER — Encounter: Payer: Self-pay | Admitting: Family Medicine

## 2017-07-07 ENCOUNTER — Ambulatory Visit (INDEPENDENT_AMBULATORY_CARE_PROVIDER_SITE_OTHER): Payer: Medicare Other | Admitting: Family Medicine

## 2017-07-07 VITALS — BP 112/72 | Temp 98.4°F | Ht 64.0 in | Wt 129.0 lb

## 2017-07-07 DIAGNOSIS — M545 Low back pain: Secondary | ICD-10-CM

## 2017-07-07 LAB — POCT URINALYSIS DIPSTICK
Blood, UA: NEGATIVE
Leukocytes, UA: NEGATIVE
Spec Grav, UA: <=1.005 — AB (ref 1.010–1.025)
pH, UA: 7 (ref 5.0–8.0)

## 2017-07-07 MED ORDER — NAPROXEN 500 MG PO TABS: 500.0000 mg | ORAL_TABLET | Freq: Two times a day (BID) | ORAL | 0 refills | Status: DC
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
## 2017-07-14 ENCOUNTER — Ambulatory Visit (INDEPENDENT_AMBULATORY_CARE_PROVIDER_SITE_OTHER): Payer: Medicare Other | Admitting: Family Medicine

## 2017-07-14 ENCOUNTER — Encounter: Payer: Self-pay | Admitting: Family Medicine

## 2017-07-14 DIAGNOSIS — M545 Low back pain: Secondary | ICD-10-CM

## 2017-07-14 DIAGNOSIS — R51 Headache: Secondary | ICD-10-CM

## 2017-07-14 DIAGNOSIS — S0083XD Contusion of other part of head, subsequent encounter: Secondary | ICD-10-CM

## 2017-07-14 DIAGNOSIS — J069 Acute upper respiratory infection, unspecified: Secondary | ICD-10-CM | POA: Diagnosis not present

## 2017-07-14 MED ORDER — IBUPROFEN 600 MG PO TABS: 600.0000 mg | ORAL_TABLET | Freq: Three times a day (TID) | ORAL | 2 refills | Status: DC | PRN
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
## 2017-09-05 ENCOUNTER — Ambulatory Visit (INDEPENDENT_AMBULATORY_CARE_PROVIDER_SITE_OTHER): Payer: Medicare Other | Admitting: Otolaryngology

## 2017-10-17 ENCOUNTER — Ambulatory Visit (INDEPENDENT_AMBULATORY_CARE_PROVIDER_SITE_OTHER): Payer: Medicare Other | Admitting: Otolaryngology

## 2017-10-17 DIAGNOSIS — K219 Gastro-esophageal reflux disease without esophagitis: Secondary | ICD-10-CM

## 2017-10-17 DIAGNOSIS — F17210 Nicotine dependence, cigarettes, uncomplicated: Secondary | ICD-10-CM | POA: Diagnosis not present

## 2017-10-17 DIAGNOSIS — R070 Pain in throat: Secondary | ICD-10-CM | POA: Diagnosis not present

## 2017-11-01 ENCOUNTER — Ambulatory Visit: Payer: Medicare Other | Admitting: Family Medicine

## 2017-11-02 ENCOUNTER — Ambulatory Visit (INDEPENDENT_AMBULATORY_CARE_PROVIDER_SITE_OTHER): Payer: Medicare Other | Admitting: Family Medicine

## 2017-11-02 ENCOUNTER — Encounter: Payer: Self-pay | Admitting: Family Medicine

## 2017-11-02 VITALS — BP 130/78 | Temp 98.1°F | Ht 64.0 in | Wt 128.4 lb

## 2017-11-02 DIAGNOSIS — S30861A Insect bite (nonvenomous) of abdominal wall, initial encounter: Secondary | ICD-10-CM

## 2017-11-02 DIAGNOSIS — W57XXXA Bitten or stung by nonvenomous insect and other nonvenomous arthropods, initial encounter: Secondary | ICD-10-CM

## 2017-11-02 MED ORDER — FLUCONAZOLE 150 MG PO TABS: 150.0000 mg | ORAL_TABLET | Freq: Once | ORAL | 4 refills | Status: AC

## 2017-11-02 MED ORDER — DOXYCYCLINE HYCLATE 100 MG PO CAPS: 100.0000 mg | ORAL_CAPSULE | Freq: Two times a day (BID) | ORAL | 0 refills | Status: DC

## 2017-11-02 NOTE — Progress Notes (Signed)
   Subjective:    Patient ID: Mackenzie Williams, female    DOB: 06-23-1970, 47 y.o.   MRN: [id number]  HPI Pt here for area on left side of abdomen. Pt states she is not sure if it is a tick bite. Pt did pull a deer tick off yesterday morning.  Pt states she has not been feeling well for about a year but has became worse over the past few months.  Relates pain discomfort around the tick bite.  She is here as a urgent care working for the tick bite the other issues she will follow-up if ongoing troubles Review of Systems Denies fever headache muscle aches nausea vomiting diarrhea    Objective:   Physical Exam  Lungs clear heart regular has a lower abdominal pelvic area tick bite with excoriated area and scab      Assessment & Plan:  Localized infection doxycycline for 7 days follow-up if progressive troubles Follow-up by September lab work indicated by that time

## 2017-11-02 NOTE — Patient Instructions (Signed)

## 2017-11-07 ENCOUNTER — Telehealth: Payer: Self-pay | Admitting: Family Medicine

## 2017-11-07 NOTE — Telephone Encounter (Signed)
Patient was prescribed doxycycline on 11/02/17 for a tick bite.  She said she has a skin condition that seemed to flare up over the weekend and she believes this is due to the antibiotic so she stopped taking it.  She wants to know what is recommended?  [facility] [place]

## 2017-11-07 NOTE — Telephone Encounter (Signed)
Patient states she has a skin condition under her arm pits that burns and itches that often flares up,but this time it was much worse, worse than ever so she stopped taking the antibx. After stopping the antibx the skin condition got better. She stopped the antibx and wanted to let us know.She wanted to know if she needed to do something else for this. Please advise.

## 2017-11-07 NOTE — Telephone Encounter (Signed)
I called left a message to r/c. 

## 2017-11-07 NOTE — Telephone Encounter (Signed)
Seen for nonspec symtoms last wk so o k to stop doxy, rec call us if develops headache or fever or bad muscle and joint aches or worsening rash

## 2017-11-07 NOTE — Telephone Encounter (Signed)
I called and left a message asked that she r/c so I can get more information.

## 2017-11-08 NOTE — Telephone Encounter (Signed)
Patient is aware 

## 2017-11-08 NOTE — Telephone Encounter (Signed)
I called and left a message to r/c. 

## 2017-12-09 ENCOUNTER — Ambulatory Visit (INDEPENDENT_AMBULATORY_CARE_PROVIDER_SITE_OTHER): Payer: Medicare Other | Admitting: Family Medicine

## 2017-12-09 ENCOUNTER — Encounter: Payer: Self-pay | Admitting: Family Medicine

## 2017-12-09 VITALS — BP 102/68 | Temp 98.2°F | Ht 64.0 in | Wt 129.0 lb

## 2017-12-09 DIAGNOSIS — M21611 Bunion of right foot: Secondary | ICD-10-CM

## 2017-12-09 DIAGNOSIS — G43809 Other migraine, not intractable, without status migrainosus: Secondary | ICD-10-CM | POA: Diagnosis not present

## 2017-12-09 MED ORDER — TOPIRAMATE 25 MG PO TABS
25.0000 mg | ORAL_TABLET | Freq: Two times a day (BID) | ORAL | 4 refills | Status: DC
Start: 2017-12-09 — End: 2018-08-08

## 2017-12-09 NOTE — Progress Notes (Signed)
   Subjective:    Patient ID: Mackenzie Williams, female    DOB: [birth year], 47 y.o.   MRN: [id number]  HPICallus on right foot. Tried wrapping with a bandage.  She relates a progressive callus on her right foot she relates pain and discomfort when she walks she has a fused ankle she sees a specialist at [facility] relates pain discomfort in her ankle in her foot  Has frequent migraines describes as a throbbing aching at times she wonders if it is tension headaches she does get stressed a lot headaches do not wake her up at night no vomiting with it-she is interested in trying Topamax again   Review of Systems  Constitutional: Negative for activity change and appetite change.  HENT: Negative for congestion.   Respiratory: Negative for cough.   Cardiovascular: Negative for chest pain.  Gastrointestinal: Negative for abdominal pain and vomiting.  Skin: Negative for color change.  Neurological: Positive for headaches. Negative for weakness.  Psychiatric/Behavioral: Negative for confusion.       Objective:   Physical Exam  Constitutional: She appears well-nourished. No distress.  Cardiovascular: Normal rate, regular rhythm and normal heart sounds.  No murmur heard. Pulmonary/Chest: Effort normal and breath sounds normal. No respiratory distress.  Musculoskeletal: She exhibits no edema.  Lymphadenopathy:    She has no cervical adenopathy.  Neurological: She is alert. She exhibits normal muscle tone.  Psychiatric: Her behavior is normal.  Vitals reviewed.  Has significant bunion with a callus       Assessment & Plan:  Severe bunion-has a very large callus associated with this this was trimmed back with a #15 blade frequent migraines-we will refer to orthopedic doctor [facility]  Topamax recommended patient will notify us if ongoing trouble she will do a headache calendar she will also read about migraines she will follow-up within the next couple months depending on what her headache  calendar shows no red flags

## 2017-12-09 NOTE — Progress Notes (Signed)
I called and left a message to r/c. I have put in the referral to [facility].

## 2017-12-19 ENCOUNTER — Ambulatory Visit (INDEPENDENT_AMBULATORY_CARE_PROVIDER_SITE_OTHER): Payer: Medicare Other | Admitting: Otolaryngology

## 2017-12-20 ENCOUNTER — Encounter: Payer: Self-pay | Admitting: Family Medicine

## 2018-01-09 DIAGNOSIS — M79671 Pain in right foot: Secondary | ICD-10-CM | POA: Diagnosis not present

## 2018-01-09 DIAGNOSIS — Z4789 Encounter for other orthopedic aftercare: Secondary | ICD-10-CM | POA: Diagnosis not present

## 2018-01-09 DIAGNOSIS — M19171 Post-traumatic osteoarthritis, right ankle and foot: Secondary | ICD-10-CM | POA: Diagnosis not present

## 2018-01-09 DIAGNOSIS — Z885 Allergy status to narcotic agent status: Secondary | ICD-10-CM | POA: Diagnosis not present

## 2018-01-09 DIAGNOSIS — Z96661 Presence of right artificial ankle joint: Secondary | ICD-10-CM | POA: Diagnosis not present

## 2018-01-09 DIAGNOSIS — M2011 Hallux valgus (acquired), right foot: Secondary | ICD-10-CM | POA: Diagnosis not present

## 2018-02-23 ENCOUNTER — Other Ambulatory Visit: Payer: Self-pay | Admitting: Women's Health

## 2018-04-04 ENCOUNTER — Ambulatory Visit: Payer: Medicare Other | Admitting: Family Medicine

## 2018-04-06 ENCOUNTER — Ambulatory Visit: Payer: Medicare Other | Admitting: Family Medicine

## 2018-04-12 ENCOUNTER — Ambulatory Visit (INDEPENDENT_AMBULATORY_CARE_PROVIDER_SITE_OTHER): Payer: Medicare Other | Admitting: Family Medicine

## 2018-04-12 VITALS — BP 126/82 | Ht 64.0 in | Wt 138.2 lb

## 2018-04-12 DIAGNOSIS — L989 Disorder of the skin and subcutaneous tissue, unspecified: Secondary | ICD-10-CM

## 2018-04-12 DIAGNOSIS — Z23 Encounter for immunization: Secondary | ICD-10-CM

## 2018-04-12 DIAGNOSIS — E785 Hyperlipidemia, unspecified: Secondary | ICD-10-CM

## 2018-04-12 NOTE — Patient Instructions (Signed)

## 2018-04-12 NOTE — Progress Notes (Signed)
   Subjective:    Patient ID: Mackenzie Williams, female    DOB: 1970/10/10, 47 y.o.   MRN: [id number]  HPI  Patient arrives with sore between 2 toes on her right foot for a few weeks. Patient states it is too sore to wear her compression stockings and tennis shoes. She relates pain in her ankle pain in her feet sore on the toe causes her problems  Patient also concerned about her cholesterol and his willingness to work hard on diet in trying his best she can to get this under better control PMH benign Review of Systems  Constitutional: Negative for activity change, fatigue and fever.  HENT: Negative for congestion and rhinorrhea.   Respiratory: Negative for cough, chest tightness and shortness of breath.   Cardiovascular: Negative for chest pain and leg swelling.  Gastrointestinal: Negative for abdominal pain and nausea.  Skin: Negative for color change.  Neurological: Negative for dizziness and headaches.  Psychiatric/Behavioral: Negative for agitation and behavioral problems.       Objective:   Physical Exam  Constitutional: She appears well-nourished. No distress.  HENT:  Head: Normocephalic and atraumatic.  Eyes: Right eye exhibits no discharge. Left eye exhibits no discharge.  Neck: No tracheal deviation present.  Cardiovascular: Normal rate, regular rhythm and normal heart sounds.  No murmur heard. Pulmonary/Chest: Effort normal and breath sounds normal. No respiratory distress.  Musculoskeletal: She exhibits no edema.  Lymphadenopathy:    She has no cervical adenopathy.  Neurological: She is alert. Coordination normal.  Skin: Skin is warm and dry.  Psychiatric: She has a normal mood and affect. Her behavior is normal.  Vitals reviewed.         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

## 2018-04-13 ENCOUNTER — Encounter: Payer: Self-pay | Admitting: Family Medicine

## 2018-04-18 ENCOUNTER — Ambulatory Visit: Payer: Medicare Other | Admitting: Obstetrics & Gynecology

## 2018-04-26 ENCOUNTER — Encounter: Payer: Self-pay | Admitting: Obstetrics and Gynecology

## 2018-04-26 ENCOUNTER — Ambulatory Visit (INDEPENDENT_AMBULATORY_CARE_PROVIDER_SITE_OTHER): Payer: Medicare Other | Admitting: Obstetrics and Gynecology

## 2018-04-26 ENCOUNTER — Other Ambulatory Visit: Payer: Self-pay

## 2018-04-26 VITALS — BP 131/73 | HR 63 | Ht 64.25 in | Wt 131.6 lb

## 2018-04-26 DIAGNOSIS — Z113 Encounter for screening for infections with a predominantly sexual mode of transmission: Secondary | ICD-10-CM | POA: Diagnosis not present

## 2018-04-26 NOTE — Progress Notes (Signed)
Patient ID: JANILA ARRAZOLA, female   DOB: 05-18-1971, 47 y.o.   MRN: [id number]    [facility] Visit  @DATE @            Patient name: Mackenzie Williams MRN [id number]  Date of birth: 08/12/1970  CC & HPI:  Mackenzie Williams is a 47 y.o. female presenting today for vaginal discomfort, says that she feel pain at vaginal entrance. Recently started seeing someone and partner is aware of Arriana having herpes. They recently had sexual intercourse and condom broke and wanted to get tested. G2P0 miscarried and had significant scar tissue  Has had intermittent nausea and doesn't believe it is caused from vaginal discomfort.  ROS:  ROS  +vaginal discomfort -vaginal discharge +intermittent nausea +constipation -fever -chills All systems are negative except as noted in the HPI and PMH.  Pertinent History Reviewed:   Reviewed:  Medical         Past Medical History:  Diagnosis Date  . Asthma   . COPD (chronic obstructive pulmonary disease) ([place])   . Esophageal hernia   . Genital herpes   . H. pylori infection 11/2011  . Leg pain, right   . Menorrhagia    [facility]  . MRSA carrier   . Neck pain   . Rosacea   . Seasonal allergies   . Varicose veins                               Surgical Hx:    Past Surgical History:  Procedure Laterality Date  . ANKLE SURGERY  1999  . CHOLECYSTECTOMY  04/07/2011   Dr Elenor Quinones dyskinesia  . ESOPHAGOGASTRODUODENOSCOPY  03/02/2012   RMR: small hiatal hernia. Gastric polyp and abnormal gastric mucosa of doubtfull clinical significance- status post biopsy (benign). No explanation for abdominal pain based on today's examination.   [name] [place]  2010  . HIP SURGERY    . UTERINE FIBROID SURGERY  2011  . UTERINE SEPTUM RESECTION  2011   Medications: Reviewed & Updated - see associated section                       Current Outpatient Medications:  .  famotidine (PEPCID) 40 MG tablet, Take 40 mg by mouth at bedtime., Disp:  , Rfl: 99 .  ibuprofen (ADVIL,MOTRIN) 600 MG tablet, Take 1 tablet (600 mg total) by mouth every 8 (eight) hours as needed., Disp: 30 tablet, Rfl: 2 .  triamcinolone cream (KENALOG) 0.1 %, Apply 1 application topically 2 (two) times daily., Disp: 45 g, Rfl: 4 .  valACYclovir (VALTREX) 500 MG tablet, TAKE 1 TABLET BY MOUTH ONCE DAILY, Disp: 30 tablet, Rfl: 1 .  topiramate (TOPAMAX) 25 MG tablet, Take 1 tablet (25 mg total) by mouth 2 (two) times daily. (Patient not taking: Reported on 04/26/2018), Disp: 60 tablet, Rfl: 4   Social History: Reviewed -  reports that she has been smoking cigarettes. She has a 5.00 pack-year smoking history. She has never used smokeless tobacco.  Objective Findings:  Vitals: Blood pressure 131/73, pulse 63, height 5' 4.25" (1.632 m), weight 131 lb 9.6 oz (59.7 kg).  PHYSICAL EXAMINATION General appearance - alert, well appearing, and in no distress and oriented to person, place, and time Mental status - alert, oriented to person, place, and time, normal mood, behavior, speech, dress, motor activity, and thought processes, affect appropriate to mood  PELVIC Vagina - slight thickening of tissue at vaginal entrance, good secretion Cervix - nulliparous, adequate support Uterus - anterior, palpable stool behind uterus Adnexa- non-tender  Assessment & Plan:   A:  1. STI screen 2. Constipation  P:  1. Rx refill Valtrex 500 mg tablet 2. Miralax 3. GC,CHL collected 4. F/u with phone call and PRN    By signing my name below, I, Samul Dada, attest that this documentation has been prepared under the direction and in the presence of Jonnie Kind, MD Electronically Signed: [place]. 04/26/18. 2:06 PM.  I personally performed the services described in this documentation, which was SCRIBED in my presence. The recorded information has been reviewed and considered accurate. It has been edited as necessary during review. Jonnie Kind,  MD

## 2018-04-27 ENCOUNTER — Encounter: Payer: Self-pay | Admitting: Podiatry

## 2018-04-27 ENCOUNTER — Other Ambulatory Visit: Payer: Self-pay | Admitting: Podiatry

## 2018-04-27 ENCOUNTER — Ambulatory Visit (INDEPENDENT_AMBULATORY_CARE_PROVIDER_SITE_OTHER): Payer: Medicare Other

## 2018-04-27 ENCOUNTER — Ambulatory Visit (INDEPENDENT_AMBULATORY_CARE_PROVIDER_SITE_OTHER): Payer: Medicare Other | Admitting: Podiatry

## 2018-04-27 VITALS — BP 124/82 | HR 64

## 2018-04-27 DIAGNOSIS — M2041 Other hammer toe(s) (acquired), right foot: Secondary | ICD-10-CM

## 2018-04-27 DIAGNOSIS — M79671 Pain in right foot: Secondary | ICD-10-CM

## 2018-04-27 DIAGNOSIS — L84 Corns and callosities: Secondary | ICD-10-CM | POA: Diagnosis not present

## 2018-04-27 DIAGNOSIS — M21619 Bunion of unspecified foot: Secondary | ICD-10-CM

## 2018-04-27 NOTE — Patient Instructions (Signed)

## 2018-04-28 LAB — GC/CHLAMYDIA PROBE AMP
Chlamydia trachomatis, NAA: NEGATIVE
Neisseria gonorrhoeae by PCR: NEGATIVE

## 2018-04-30 NOTE — Progress Notes (Signed)
Subjective:   Patient ID: Mackenzie Williams, female   DOB: 47 y.o.   MRN: [id number]   HPI Patient presents with significant structural malalignment and is found to have a keratotic lesion occurring between the hallux and second toe right that is painful and make shoe gear difficult.  Patient states is gradually become more of an issue over the last few years does smoke and likes to be active if possible   Review of Systems  All other systems reviewed and are negative.       Objective:  Physical Exam  Constitutional: She appears well-developed and well-nourished.  Cardiovascular: Intact distal pulses.  Pulmonary/Chest: Effort normal.  Musculoskeletal: Normal range of motion.  Neurological: She is alert.  Skin: Skin is warm.  Nursing note and vitals reviewed.   Neurovascular status found to be intact with muscle strength adequate range of motion within normal limits with patient found to have hallux that is deviated pressing against the second toe with inner keratotic lesion of the hallux second toe that are painful when pressed with fluid buildup     Assessment:  Digital deformity with keratotic lesion formation secondary to foot structure and pressure occurring between hallux and second toe     Plan:  H&P conditions reviewed and at this time sterile debridement accomplished with no iatrogenic bleeding and applied a cushion to reduce stress between the toes and advised on gradual her ultimate bunion correction along with digital correction if symptoms persist  X-ray indicates that there is structural malalignment of the hallux and there is pressure between the hallux and second digit

## 2018-05-05 ENCOUNTER — Other Ambulatory Visit: Payer: Self-pay | Admitting: Women's Health

## 2018-05-11 ENCOUNTER — Ambulatory Visit: Payer: Medicare Other | Admitting: Family Medicine

## 2018-05-24 ENCOUNTER — Telehealth: Payer: Self-pay | Admitting: *Deleted

## 2018-05-24 NOTE — Telephone Encounter (Signed)
DOB verified. Informed pt of negative gc/chl from April 26, 2018

## 2018-07-05 ENCOUNTER — Telehealth: Payer: Self-pay | Admitting: Family Medicine

## 2018-07-05 MED ORDER — CEPHALEXIN 500 MG PO CAPS: 500.0000 mg | ORAL_CAPSULE | Freq: Four times a day (QID) | ORAL | 0 refills | Status: DC

## 2018-07-05 NOTE — Telephone Encounter (Signed)
Patient stated she had an issue with bed bugs and one got in her ear and caused problems and she feels she needs an antibiotic sent in to clear everything up. Patient stated she was unable to come to office to be seen at this time and just needs something sent in   [place] in [place]

## 2018-07-05 NOTE — Telephone Encounter (Signed)
Prescription sent electronically to pharmacy. Tried phone numbers in chart but were told they were the wrong number- clarify phone number at next visit.

## 2018-07-05 NOTE — Telephone Encounter (Signed)
Given the difficult situation Given that she cannot come in We will go with Keflex 500 mg 4 times daily 7 days Follow-up if any ongoing troubles

## 2018-07-13 ENCOUNTER — Telehealth: Payer: Self-pay | Admitting: Family Medicine

## 2018-07-13 ENCOUNTER — Ambulatory Visit: Payer: Medicare Other | Admitting: Family Medicine

## 2018-07-13 NOTE — Telephone Encounter (Signed)
Pt canceled appt today with Dr.Scott 07/13/18 due to lack of transportation, pt states as of yesterday she started having lower back pain and unspecified bilateral leg pain, pt states also she wonders if the bed bug that went into her ear may have caused a "internal body infection" and pt states the antibiotic did not work, and is also having head pain. Advise.   Pt was also advised yesterday if she was unable to make it to an appt today due wanting treatment yesterday under her discretion to go to ER or Urgent Care.    Pharmacy:  [facility] [address] [place] [address]

## 2018-07-13 NOTE — Telephone Encounter (Signed)
It is hard to tell for certain what is going on with her I agree with management

## 2018-07-13 NOTE — Telephone Encounter (Signed)
Tried to call no answer

## 2018-07-13 NOTE — Telephone Encounter (Signed)
Pt called and she states she has pain that moves all around her body. One day it might be her back, one day her chest, one day her head, today it is sore throat and ear pain. No fever that she knows of. She thinks all of this is coming from the bed bugs. Pt was getting aggitated on the phone when I kept asking questions. She states she just needs somebody to help her. Feels like something bad is going to happen to her. She can't explain it she just has a sick feeling. Has no transportation. Advised pt to call 911. She states she will call 911 if she can't find anybody to take her to urgent care or ED.

## 2018-07-14 ENCOUNTER — Telehealth: Payer: Self-pay | Admitting: *Deleted

## 2018-07-14 NOTE — Telephone Encounter (Signed)
Pt advised to go to hospital yesterday when she called but she did not go. See phone message from yesterday.  Pt started back taking keflex again. She states she is having headache just not quite as bad since starting back on antibiotic. She already had appt scheduled for Monday. Pt advised to go to ED if worse over weekend.

## 2018-07-17 ENCOUNTER — Ambulatory Visit (INDEPENDENT_AMBULATORY_CARE_PROVIDER_SITE_OTHER): Payer: Medicare Other | Admitting: Family Medicine

## 2018-07-17 ENCOUNTER — Encounter: Payer: Self-pay | Admitting: Family Medicine

## 2018-07-17 VITALS — BP 128/80 | Temp 98.5°F | Ht 64.0 in | Wt 126.4 lb

## 2018-07-17 DIAGNOSIS — W57XXXA Bitten or stung by nonvenomous insect and other nonvenomous arthropods, initial encounter: Secondary | ICD-10-CM

## 2018-07-17 DIAGNOSIS — R5383 Other fatigue: Secondary | ICD-10-CM

## 2018-07-17 MED ORDER — HYDROXYZINE HCL 25 MG PO TABS: 25.0000 mg | ORAL_TABLET | Freq: Four times a day (QID) | ORAL | 0 refills | Status: DC | PRN

## 2018-07-17 NOTE — Progress Notes (Signed)
   Subjective:    Patient ID: Mackenzie Williams, female    DOB: February 23, 1971, 48 y.o.   MRN: [id number]  HPIpain moving all around body. First day it was her leg and then her low back, then went to extreme headache. Feels like something sitting on her brain, vision is blurry, weakness, has lost weight. Pt thinks all of this has come from the bed bug she had in her ear. She is having home sprayed tomorrow for bed bugs. Pt states she feels so bad she feels like she will not wake up when she goes to sleep. She thinks the keflex she was taking has made her worse. She did take one yesterday but split it up to take 4 times.   Patient is feeling very stressed out about bedbug situation she also has a very difficult social situation where she really does not have many people helping her She describes her self at times feeling nervous and on edge Denies being depressed Denies any fever chills sweats nausea vomiting diarrhea.  She is trying to the best she can eating  Review of Systems  Constitutional: Negative for activity change, fatigue and fever.  HENT: Negative for congestion and rhinorrhea.   Respiratory: Negative for cough, chest tightness and shortness of breath.   Cardiovascular: Negative for chest pain and leg swelling.  Gastrointestinal: Negative for abdominal pain and nausea.  Skin: Negative for color change.  Neurological: Negative for dizziness and headaches.  Psychiatric/Behavioral: Negative for agitation and behavioral problems.       Objective:   Physical Exam Vitals signs reviewed.  Constitutional:      General: She is not in acute distress. HENT:     Head: Normocephalic and atraumatic.  Eyes:     General:        Right eye: No discharge.        Left eye: No discharge.  Neck:     Trachea: No tracheal deviation.  Cardiovascular:     Rate and Rhythm: Normal rate and regular rhythm.     Heart sounds: Normal heart sounds. No murmur.  Pulmonary:     Effort: Pulmonary effort is  normal. No respiratory distress.     Breath sounds: Normal breath sounds.  Lymphadenopathy:     Cervical: No cervical adenopathy.  Skin:    General: Skin is warm and dry.  Neurological:     Mental Status: She is alert.     Coordination: Coordination normal.  Psychiatric:        Behavior: Behavior normal.           Assessment & Plan:  I am very sympathetic regarding this patient she is going through a difficult time At the same time I find no evidence of bedbug infestation I am sure that she is having this at home but I reassured her that I did not find any in her ear I would recommend that she stop Keflex We will go ahead and do some lab work No need for any other interventions currently The patient very stressed overall does not have much support network I encouraged her to call social services

## 2018-07-18 LAB — CBC WITH DIFFERENTIAL/PLATELET
BASOS ABS: 0 10*3/uL (ref 0.0–0.2)
Basos: 1 %
EOS (ABSOLUTE): 0 10*3/uL (ref 0.0–0.4)
EOS: 0 %
Hematocrit: 42 % (ref 34.0–46.6)
Hemoglobin: 14.2 g/dL (ref 11.1–15.9)
IMMATURE GRANS (ABS): 0 10*3/uL (ref 0.0–0.1)
Immature Granulocytes: 0 %
LYMPHS: 61 %
Lymphocytes Absolute: 2.4 10*3/uL (ref 0.7–3.1)
MCH: 30.6 pg (ref 26.6–33.0)
MCHC: 33.8 g/dL (ref 31.5–35.7)
MCV: 91 fL (ref 79–97)
MONOCYTES: 12 %
Monocytes Absolute: 0.5 10*3/uL (ref 0.1–0.9)
NEUTROS ABS: 1 10*3/uL — AB (ref 1.4–7.0)
Neutrophils: 26 %
PLATELETS: 187 10*3/uL (ref 150–450)
RBC: 4.64 x10E6/uL (ref 3.77–5.28)
RDW: 14 % (ref 11.7–15.4)
WBC: 3.8 10*3/uL (ref 3.4–10.8)

## 2018-07-18 LAB — BASIC METABOLIC PANEL
BUN/Creatinine Ratio: 23 (ref 9–23)
BUN: 14 mg/dL (ref 6–24)
CALCIUM: 9.4 mg/dL (ref 8.7–10.2)
CO2: 27 mmol/L (ref 20–29)
CREATININE: 0.61 mg/dL (ref 0.57–1.00)
Chloride: 100 mmol/L (ref 96–106)
GFR calc Af Amer: 123 mL/min/{1.73_m2} (ref >59)
GFR, EST NON AFRICAN AMERICAN: 107 mL/min/{1.73_m2} (ref >59)
GLUCOSE: 95 mg/dL (ref 65–99)
POTASSIUM: 4 mmol/L (ref 3.5–5.2)
SODIUM: 143 mmol/L (ref 134–144)

## 2018-07-18 LAB — HEPATIC FUNCTION PANEL
ALBUMIN: 4.2 g/dL (ref 3.8–4.8)
ALT: 16 IU/L (ref 0–32)
AST: 19 IU/L (ref 0–40)
Alkaline Phosphatase: 66 IU/L (ref 39–117)
BILIRUBIN TOTAL: 0.5 mg/dL (ref 0.0–1.2)
Bilirubin, Direct: 0.17 mg/dL (ref 0.00–0.40)
TOTAL PROTEIN: 7 g/dL (ref 6.0–8.5)

## 2018-07-18 LAB — TSH: TSH: 3.54 u[IU]/mL (ref 0.450–4.500)

## 2018-07-20 ENCOUNTER — Telehealth: Payer: Self-pay | Admitting: Family Medicine

## 2018-07-20 DIAGNOSIS — W57XXXA Bitten or stung by nonvenomous insect and other nonvenomous arthropods, initial encounter: Secondary | ICD-10-CM

## 2018-07-20 NOTE — Telephone Encounter (Signed)
Patient was seen 1/27 but now she is having real bad headaches,has stopped taking antibiotic.She states this is a physical issue not mental.Now is having nausea and weakness some. She would like a copy of lab results mailed to her.

## 2018-07-20 NOTE — Telephone Encounter (Signed)
Discussed with pt that her bloodwork was reassuring. She wants to know what is wrong with her then. States she is not making up her symptoms. States she is eating enough and does not know why she is losing weight. Still having pressure in her head. Not sure if she has fever. She is worried she has a parasite.

## 2018-07-20 NOTE — Telephone Encounter (Signed)
She can do stool testing for O&P  Follow-up office visit 4 weeks if she does not already have one

## 2018-07-20 NOTE — Telephone Encounter (Signed)
Left message to return call 

## 2018-07-24 DIAGNOSIS — R51 Headache: Secondary | ICD-10-CM | POA: Diagnosis not present

## 2018-07-24 DIAGNOSIS — I959 Hypotension, unspecified: Secondary | ICD-10-CM | POA: Diagnosis not present

## 2018-07-24 DIAGNOSIS — Z0389 Encounter for observation for other suspected diseases and conditions ruled out: Secondary | ICD-10-CM | POA: Diagnosis not present

## 2018-07-24 DIAGNOSIS — T161XXA Foreign body in right ear, initial encounter: Secondary | ICD-10-CM | POA: Diagnosis not present

## 2018-07-25 NOTE — Telephone Encounter (Signed)
Telephone call- voicemail box not set up

## 2018-07-28 NOTE — Addendum Note (Signed)
Addended by: Dairl Ponder on: 07/28/2018 11:40 AM   Modules accepted: Orders

## 2018-07-28 NOTE — Telephone Encounter (Signed)
Patient advised per Dr Nicki Reaper: She can do stool testing for O&P  Follow-up office visit 4 weeks if she does not already have one  Patient verbalized understanding and has a follow up office visit scheduled 08/08/18 with Dr Nicki Reaper.   Stool Test ordered in Epic.

## 2018-07-28 NOTE — Telephone Encounter (Signed)
Telephone call- voicemail box not set up

## 2018-08-02 ENCOUNTER — Telehealth: Payer: Self-pay | Admitting: Family Medicine

## 2018-08-02 NOTE — Telephone Encounter (Signed)
Unable to leave message due to no voicemail being set up.

## 2018-08-02 NOTE — Telephone Encounter (Signed)
Patient wants to do a stool sample because she states hurting so bad in her back and has a very bad headache still.Has appointment on 2/18.Patient was triage by nurse and was told to go to Urgent care or Er if symptoms get worst.

## 2018-08-02 NOTE — Telephone Encounter (Signed)
Tried to contact patient. Unable to leave voicemail due to no voicemail set up. Pt has stool sample cups waiting up front. (Per last note)

## 2018-08-02 NOTE — Telephone Encounter (Signed)
I am willing to see her on Friday if necessary preferably morning or early afternoon not late in the afternoon otherwise she needs to keep the appointment for next week Stool tests already ordered

## 2018-08-02 NOTE — Telephone Encounter (Signed)
Pt returned call and set up appt for Friday morning.

## 2018-08-02 NOTE — Telephone Encounter (Signed)
Pt states she has been having trouble getting to bathroom at times. Pain in right side of abdomen and going in to back. Back is burning. On and off since bed bug incident. Pt has problems with transportation. Pt also states she has a horrible headache. Pt states she has had treatment for bed bugs. Pt states she is really weak. Pt state she has been laying down almost all day. Pt states she believes that the treatment they used for bed bugs may have compromised her immune system. Pt has appt next week. Pt also states that she has issues with transportation. Please advise. Thank you.

## 2018-08-03 NOTE — Telephone Encounter (Signed)
Has appointment at 9:30 on 2/14

## 2018-08-04 ENCOUNTER — Ambulatory Visit: Payer: Medicare Other | Admitting: Family Medicine

## 2018-08-08 ENCOUNTER — Encounter: Payer: Self-pay | Admitting: Family Medicine

## 2018-08-08 ENCOUNTER — Ambulatory Visit (INDEPENDENT_AMBULATORY_CARE_PROVIDER_SITE_OTHER): Payer: Medicare Other | Admitting: Family Medicine

## 2018-08-08 VITALS — BP 128/74 | Ht 64.0 in | Wt 124.4 lb

## 2018-08-08 DIAGNOSIS — K589 Irritable bowel syndrome without diarrhea: Secondary | ICD-10-CM

## 2018-08-08 DIAGNOSIS — F439 Reaction to severe stress, unspecified: Secondary | ICD-10-CM

## 2018-08-08 NOTE — Progress Notes (Signed)
   Subjective:    Patient ID: Mackenzie Williams, female    DOB: August 11, 1970, 48 y.o.   MRN: [id number]  HPI Pt here today for 3 week follow up. Pt states she has been having severe headaches, pain in right ear, fuzzy vision, back pain. Pt states she has had some bowel difference. Pt states she is taking lots of laxatives.  Pt states she went to [facility] a couple of nights ago. Recently patient had problem with bedbugs she has had this treated but is caused her significant stress and worry Review of Systems  Constitutional: Negative for activity change, appetite change and fatigue.  HENT: Negative for congestion and rhinorrhea.   Respiratory: Negative for cough and shortness of breath.   Cardiovascular: Negative for chest pain and leg swelling.  Gastrointestinal: Negative for abdominal pain and diarrhea.  Endocrine: Negative for polydipsia and polyphagia.  Skin: Negative for color change.  Neurological: Negative for dizziness and weakness.  Psychiatric/Behavioral: Negative for behavioral problems and confusion.       Objective:   Physical Exam Vitals signs reviewed.  Constitutional:      General: She is not in acute distress. HENT:     Head: Normocephalic and atraumatic.  Eyes:     General:        Right eye: No discharge.        Left eye: No discharge.  Neck:     Trachea: No tracheal deviation.  Cardiovascular:     Rate and Rhythm: Normal rate and regular rhythm.     Heart sounds: Normal heart sounds. No murmur.  Pulmonary:     Effort: Pulmonary effort is normal. No respiratory distress.     Breath sounds: Normal breath sounds.  Lymphadenopathy:     Cervical: No cervical adenopathy.  Skin:    General: Skin is warm and dry.  Neurological:     Mental Status: She is alert.     Coordination: Coordination normal.  Psychiatric:        Behavior: Behavior normal.    Abdomen is soft no guarding or rebound 15 minutes was spent with patient today discussing healthcare  issues which they came.  More than 50% of this visit-total duration of visit-was spent in counseling and coordination of care.  Please see diagnosis regarding the focus of this coordination and care        Assessment & Plan:  Intermittent abdominal symptoms I believe there is some IBS going on I would not recommend colonoscopy currently Patient should follow-up when she is 48 years of age and we will help set her up for colonoscopy  I find no evidence of any serious infection going on right now I think it is in the patient's best interest to eat healthy and stay physically active.  I feel the patient is under a lot of stress but not depressed.  She will follow-up here in 3 months no further testing necessary currently

## 2018-09-02 ENCOUNTER — Other Ambulatory Visit: Payer: Self-pay | Admitting: Women's Health

## 2018-09-13 ENCOUNTER — Other Ambulatory Visit: Payer: Self-pay | Admitting: *Deleted

## 2018-09-13 MED ORDER — VALACYCLOVIR HCL 500 MG PO TABS: ORAL_TABLET | ORAL | 3 refills | Status: DC

## 2018-11-06 ENCOUNTER — Ambulatory Visit: Payer: Medicare Other | Admitting: Family Medicine

## 2019-10-18 ENCOUNTER — Other Ambulatory Visit: Payer: Self-pay

## 2019-10-18 ENCOUNTER — Encounter (HOSPITAL_COMMUNITY): Payer: Self-pay | Admitting: Emergency Medicine

## 2019-10-18 DIAGNOSIS — F259 Schizoaffective disorder, unspecified: Secondary | ICD-10-CM | POA: Insufficient documentation

## 2019-10-18 DIAGNOSIS — F17210 Nicotine dependence, cigarettes, uncomplicated: Secondary | ICD-10-CM | POA: Insufficient documentation

## 2019-10-18 DIAGNOSIS — Z20822 Contact with and (suspected) exposure to covid-19: Secondary | ICD-10-CM | POA: Insufficient documentation

## 2019-10-18 DIAGNOSIS — Z03818 Encounter for observation for suspected exposure to other biological agents ruled out: Secondary | ICD-10-CM | POA: Diagnosis not present

## 2019-10-18 LAB — COMPREHENSIVE METABOLIC PANEL
ALT: 20 U/L (ref 0–44)
AST: 24 U/L (ref 15–41)
Albumin: 4.1 g/dL (ref 3.5–5.0)
Alkaline Phosphatase: 69 U/L (ref 38–126)
Anion gap: 9 (ref 5–15)
BUN: 16 mg/dL (ref 6–20)
CO2: 27 mmol/L (ref 22–32)
Calcium: 9.5 mg/dL (ref 8.9–10.3)
Chloride: 105 mmol/L (ref 98–111)
Creatinine, Ser: 0.57 mg/dL (ref 0.44–1.00)
GFR calc Af Amer: >60 mL/min (ref >60)
GFR calc non Af Amer: >60 mL/min (ref >60)
Glucose, Bld: 100 mg/dL — ABNORMAL HIGH (ref 70–99)
Potassium: 4 mmol/L (ref 3.5–5.1)
Sodium: 141 mmol/L (ref 135–145)
Total Bilirubin: 0.3 mg/dL (ref 0.3–1.2)
Total Protein: 7.6 g/dL (ref 6.5–8.1)

## 2019-10-18 LAB — CBC
HCT: 42.7 % (ref 36.0–46.0)
Hemoglobin: 13.5 g/dL (ref 12.0–15.0)
MCH: 30.7 pg (ref 26.0–34.0)
MCHC: 31.6 g/dL (ref 30.0–36.0)
MCV: 97 fL (ref 80.0–100.0)
Platelets: 234 10*3/uL (ref 150–400)
RBC: 4.4 MIL/uL (ref 3.87–5.11)
RDW: 13.7 % (ref 11.5–15.5)
WBC: 7.8 10*3/uL (ref 4.0–10.5)
nRBC: 0 % (ref 0.0–0.2)

## 2019-10-18 LAB — ETHANOL: Alcohol, Ethyl (B): <10 mg/dL (ref <10)

## 2019-10-18 LAB — ACETAMINOPHEN LEVEL: Acetaminophen (Tylenol), Serum: <10 ug/mL — ABNORMAL LOW (ref 10–30)

## 2019-10-18 LAB — SALICYLATE LEVEL: Salicylate Lvl: <7 mg/dL — ABNORMAL LOW (ref 7.0–30.0)

## 2019-10-18 NOTE — ED Triage Notes (Signed)
Pt c/o being depressed stating that she needs a "hormone to make her feel happy." Pt denies SI/HI.

## 2019-10-18 NOTE — ED Notes (Signed)
Pt very paranoid at this time, stating she thought staff was trying to set her up. Pt states that "if I tell the truth everyone will cease to exist."

## 2019-10-18 NOTE — ED Notes (Signed)
Pt stating that "they can see and hear everything through anything electronic. If I tell the truth they'll blow this hospital up."

## 2019-10-19 ENCOUNTER — Inpatient Hospital Stay (HOSPITAL_COMMUNITY)
Admission: AD | Admit: 2019-10-19 | Discharge: 2019-11-07 | DRG: 885 | Disposition: A | Discharge status: Home / Self Care | Payer: Medicare Other | Source: Intra-hospital | Attending: Psychiatry | Admitting: Psychiatry

## 2019-10-19 ENCOUNTER — Emergency Department (HOSPITAL_COMMUNITY)
Admission: EM | Admit: 2019-10-19 | Discharge: 2019-10-19 | Disposition: A | Discharge status: Other Acute Inpatient Hospital | Payer: Medicare Other | Source: Home / Self Care | Attending: Emergency Medicine | Admitting: Emergency Medicine

## 2019-10-19 ENCOUNTER — Encounter (HOSPITAL_COMMUNITY): Payer: Self-pay | Admitting: Psychiatry

## 2019-10-19 DIAGNOSIS — Z885 Allergy status to narcotic agent status: Secondary | ICD-10-CM

## 2019-10-19 DIAGNOSIS — E039 Hypothyroidism, unspecified: Secondary | ICD-10-CM | POA: Diagnosis present

## 2019-10-19 DIAGNOSIS — J449 Chronic obstructive pulmonary disease, unspecified: Secondary | ICD-10-CM | POA: Diagnosis present

## 2019-10-19 DIAGNOSIS — F419 Anxiety disorder, unspecified: Secondary | ICD-10-CM | POA: Diagnosis present

## 2019-10-19 DIAGNOSIS — F17210 Nicotine dependence, cigarettes, uncomplicated: Secondary | ICD-10-CM | POA: Diagnosis present

## 2019-10-19 DIAGNOSIS — Z79899 Other long term (current) drug therapy: Secondary | ICD-10-CM

## 2019-10-19 DIAGNOSIS — F29 Unspecified psychosis not due to a substance or known physiological condition: Secondary | ICD-10-CM | POA: Diagnosis not present

## 2019-10-19 DIAGNOSIS — B009 Herpesviral infection, unspecified: Secondary | ICD-10-CM | POA: Diagnosis present

## 2019-10-19 DIAGNOSIS — K219 Gastro-esophageal reflux disease without esophagitis: Secondary | ICD-10-CM | POA: Diagnosis present

## 2019-10-19 DIAGNOSIS — Y92230 Patient room in hospital as the place of occurrence of the external cause: Secondary | ICD-10-CM | POA: Diagnosis not present

## 2019-10-19 DIAGNOSIS — S60417A Abrasion of left little finger, initial encounter: Secondary | ICD-10-CM | POA: Diagnosis not present

## 2019-10-19 DIAGNOSIS — M549 Dorsalgia, unspecified: Secondary | ICD-10-CM | POA: Diagnosis present

## 2019-10-19 DIAGNOSIS — S069X0S Unspecified intracranial injury without loss of consciousness, sequela: Secondary | ICD-10-CM

## 2019-10-19 DIAGNOSIS — R4189 Other symptoms and signs involving cognitive functions and awareness: Secondary | ICD-10-CM | POA: Diagnosis present

## 2019-10-19 DIAGNOSIS — Z87820 Personal history of traumatic brain injury: Secondary | ICD-10-CM | POA: Diagnosis not present

## 2019-10-19 DIAGNOSIS — G8929 Other chronic pain: Secondary | ICD-10-CM | POA: Diagnosis present

## 2019-10-19 DIAGNOSIS — Z20822 Contact with and (suspected) exposure to covid-19: Secondary | ICD-10-CM | POA: Diagnosis not present

## 2019-10-19 DIAGNOSIS — N390 Urinary tract infection, site not specified: Secondary | ICD-10-CM | POA: Diagnosis present

## 2019-10-19 DIAGNOSIS — Y900 Blood alcohol level of less than 20 mg/100 ml: Secondary | ICD-10-CM | POA: Diagnosis present

## 2019-10-19 DIAGNOSIS — F259 Schizoaffective disorder, unspecified: Secondary | ICD-10-CM | POA: Diagnosis present

## 2019-10-19 DIAGNOSIS — Z96661 Presence of right artificial ankle joint: Secondary | ICD-10-CM | POA: Diagnosis present

## 2019-10-19 DIAGNOSIS — X58XXXS Exposure to other specified factors, sequela: Secondary | ICD-10-CM | POA: Diagnosis present

## 2019-10-19 DIAGNOSIS — G4700 Insomnia, unspecified: Secondary | ICD-10-CM | POA: Diagnosis present

## 2019-10-19 DIAGNOSIS — W228XXA Striking against or struck by other objects, initial encounter: Secondary | ICD-10-CM | POA: Diagnosis not present

## 2019-10-19 DIAGNOSIS — R462 Strange and inexplicable behavior: Secondary | ICD-10-CM

## 2019-10-19 DIAGNOSIS — F22 Delusional disorders: Secondary | ICD-10-CM | POA: Diagnosis not present

## 2019-10-19 DIAGNOSIS — R519 Headache, unspecified: Secondary | ICD-10-CM | POA: Diagnosis not present

## 2019-10-19 HISTORY — DX: Depression, unspecified: F32.A

## 2019-10-19 LAB — URINALYSIS, ROUTINE W REFLEX MICROSCOPIC
Bacteria, UA: NONE SEEN
Bilirubin Urine: NEGATIVE
Glucose, UA: NEGATIVE mg/dL
Ketones, ur: NEGATIVE mg/dL
Leukocytes,Ua: NEGATIVE
Nitrite: NEGATIVE
Protein, ur: NEGATIVE mg/dL
Specific Gravity, Urine: 1.021 (ref 1.005–1.030)
pH: 5 (ref 5.0–8.0)

## 2019-10-19 LAB — RESPIRATORY PANEL BY RT PCR (FLU A&B, COVID)
Influenza A by PCR: NEGATIVE
Influenza B by PCR: NEGATIVE
SARS Coronavirus 2 by RT PCR: NEGATIVE

## 2019-10-19 LAB — RAPID URINE DRUG SCREEN, HOSP PERFORMED
Amphetamines: NOT DETECTED
Barbiturates: NOT DETECTED
Benzodiazepines: NOT DETECTED
Cocaine: NOT DETECTED
Opiates: NOT DETECTED
Tetrahydrocannabinol: NOT DETECTED

## 2019-10-19 LAB — SARS CORONAVIRUS 2 (TAT 6-24 HRS): SARS Coronavirus 2: NEGATIVE

## 2019-10-19 LAB — POC URINE PREG, ED: Preg Test, Ur: NEGATIVE

## 2019-10-19 MED ORDER — TRAZODONE HCL 50 MG PO TABS
50.0000 mg | ORAL_TABLET | Freq: Every evening | ORAL | Status: DC | PRN
  Administered 2019-10-19 – 2019-10-20 (×3): 50 mg via ORAL
  Filled 2019-10-19 (×3): qty 1

## 2019-10-19 MED ORDER — LORAZEPAM 1 MG PO TABS
2.0000 mg | ORAL_TABLET | Freq: Once | ORAL | Status: AC
  Administered 2019-10-19: 2 mg via ORAL
  Filled 2019-10-19: qty 2

## 2019-10-19 MED ORDER — MAGNESIUM HYDROXIDE 400 MG/5ML PO SUSP: 30.0000 mL | Freq: Every day | ORAL | Status: DC | PRN

## 2019-10-19 MED ORDER — NICOTINE 21 MG/24HR TD PT24
21.0000 mg | MEDICATED_PATCH | Freq: Every day | TRANSDERMAL | Status: DC
  Filled 2019-10-19: qty 1

## 2019-10-19 MED ORDER — DIPHENHYDRAMINE HCL 25 MG PO CAPS: 25.0000 mg | ORAL_CAPSULE | Freq: Four times a day (QID) | ORAL | Status: DC | PRN

## 2019-10-19 MED ORDER — ACETAMINOPHEN 325 MG PO TABS
650.0000 mg | ORAL_TABLET | Freq: Four times a day (QID) | ORAL | Status: DC | PRN
  Administered 2019-10-22 – 2019-10-28 (×4): 650 mg via ORAL
  Filled 2019-10-19 (×4): qty 2

## 2019-10-19 MED ORDER — ALUM & MAG HYDROXIDE-SIMETH 200-200-20 MG/5ML PO SUSP
30.0000 mL | ORAL | Status: DC | PRN
  Administered 2019-10-20: 30 mL via ORAL

## 2019-10-19 NOTE — Progress Notes (Signed)
   10/19/19 2115  Psych Admission Type (Psych Patients Only)  Admission Status Voluntary  Psychosocial Assessment  Patient Complaints Anxiety;Insomnia  Eye Contact Fair  Facial Expression Animated;Anxious;Worried  Affect Anxious;Appropriate to circumstance;Preoccupied  Speech Logical/coherent;Pressured;Gaffer;Restless;Unsteady  Appearance/Hygiene In scrubs  Behavior Characteristics Cooperative;Appropriate to situation;Anxious;Restless  Mood Anxious;Preoccupied;Pleasant  Thought Process  Coherency WDL  Content Paranoia  Delusions Religious  Perception Derealization  Hallucination None reported or observed  Judgment Poor  Confusion Mild  Danger to Self  Current suicidal ideation? Denies  Danger to Others  Danger to Others None reported or observed   Patient has been up ion the dayroom talking to patients about God and how the world is getting worse. She is religously preoccupied but pleasant. She reports that she needs to be discharged by tomorrow so that she can get her belongings out of her place because she has been evicted. Writer encouraged her to let her Education officer, museum or doctor know about her concern for her belongings. Safety maintained with 15 min checks.

## 2019-10-19 NOTE — ED Notes (Addendum)
Mackenzie Williams has to be redirected to return to her room.  Follows commands.  Denies SI/HI.

## 2019-10-19 NOTE — ED Notes (Signed)
Pts mother picked up her apartment keys
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
## 2019-10-19 NOTE — ED Notes (Signed)
Richard, security at bedside to wand pt and secure pt money. Pt getting undressed and changing into purple scrubs at this time. Belongings placed in [facility] locker.

## 2019-10-19 NOTE — ED Notes (Signed)
Okay for pt to eat per DR Christy Gentles

## 2019-10-19 NOTE — ED Notes (Signed)
Personal belongings and pts Personal monetary signed by Fransisco Beau with [employer].

## 2019-10-19 NOTE — ED Notes (Signed)
Pt is alert and talkative.  Pt follows commands and expressed concerns about why she can't walk around the unit.

## 2019-10-19 NOTE — ED Notes (Signed)
Aquicha with [facility] reports pt meets inpatient criteria- No BH beds- will seek placement elsewhere.

## 2019-10-19 NOTE — ED Notes (Signed)
Pt requesting pen/paper to write some notes. Pt given paper and crayons  Pt given ice water per request.
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
## 2019-10-19 NOTE — ED Notes (Signed)
TTS in progress 
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
## 2019-10-19 NOTE — Progress Notes (Signed)
Pt accepted to [facility]; bed 505-1  Josephina Gip, NP is the accepting provider.    Dr. Dwyane Dee is the attending provider.    Call report to [phone]    Pt is voluntary and will be transported by [facility]  Pt is scheduled to arrive at 1pm and must have tested negative for Covid.    Audree Camel, LCSW, [place] Disposition [place] [facility]/TTS [phone]

## 2019-10-19 NOTE — ED Notes (Signed)
Pt given crackers, peanut butter, water, milk, chips,and sandwich

## 2019-10-19 NOTE — ED Notes (Signed)
TTS cart placed in pt room

## 2019-10-20 DIAGNOSIS — F29 Unspecified psychosis not due to a substance or known physiological condition: Secondary | ICD-10-CM

## 2019-10-20 LAB — LIPID PANEL
Cholesterol: 164 mg/dL (ref 0–200)
HDL: 59 mg/dL (ref >40)
LDL Cholesterol: 93 mg/dL (ref 0–99)
Total CHOL/HDL Ratio: 2.8 RATIO
Triglycerides: 60 mg/dL (ref <150)
VLDL: 12 mg/dL (ref 0–40)

## 2019-10-20 LAB — HEMOGLOBIN A1C
Hgb A1c MFr Bld: 5.8 % — ABNORMAL HIGH (ref 4.8–5.6)
Mean Plasma Glucose: 119.76 mg/dL

## 2019-10-20 LAB — TSH: TSH: 12.539 u[IU]/mL — ABNORMAL HIGH (ref 0.350–4.500)

## 2019-10-20 LAB — T4, FREE: Free T4: 0.62 ng/dL (ref 0.61–1.12)

## 2019-10-20 MED ORDER — LORAZEPAM 1 MG PO TABS
1.0000 mg | ORAL_TABLET | Freq: Four times a day (QID) | ORAL | Status: DC | PRN
  Administered 2019-10-20 – 2019-10-26 (×6): 1 mg via ORAL
  Filled 2019-10-20 (×6): qty 1

## 2019-10-20 MED ORDER — ZIPRASIDONE MESYLATE 20 MG IM SOLR: 10.0000 mg | INTRAMUSCULAR | Status: DC | PRN

## 2019-10-20 MED ORDER — LORAZEPAM 1 MG PO TABS
1.0000 mg | ORAL_TABLET | ORAL | Status: AC | PRN
  Administered 2019-10-20: 1 mg via ORAL
  Filled 2019-10-20: qty 1

## 2019-10-20 MED ORDER — OLANZAPINE 5 MG PO TBDP
5.0000 mg | ORAL_TABLET | Freq: Three times a day (TID) | ORAL | Status: DC | PRN
  Administered 2019-10-20 (×2): 5 mg via ORAL
  Filled 2019-10-20 (×2): qty 1

## 2019-10-20 MED ORDER — LORAZEPAM 2 MG/ML IJ SOLN: 1.0000 mg | Freq: Four times a day (QID) | INTRAMUSCULAR | Status: DC | PRN

## 2019-10-20 MED ORDER — FAMOTIDINE 40 MG PO TABS
40.0000 mg | ORAL_TABLET | Freq: Every day | ORAL | Status: DC
  Filled 2019-10-20: qty 1

## 2019-10-20 MED ORDER — OLANZAPINE 5 MG PO TBDP
5.0000 mg | ORAL_TABLET | Freq: Once | ORAL | Status: AC
  Administered 2019-10-20: 5 mg via ORAL
  Filled 2019-10-20 (×2): qty 1

## 2019-10-20 MED ORDER — RISPERIDONE 1 MG PO TBDP
1.0000 mg | ORAL_TABLET | Freq: Two times a day (BID) | ORAL | Status: DC
  Administered 2019-10-20 (×2): 1 mg via ORAL
  Filled 2019-10-20 (×6): qty 1

## 2019-10-20 NOTE — Progress Notes (Addendum)
Patient has been up in her room talking about her needing to get out of here as soon as possible. She has taken her sleep and anxiety  Medication but has not tried to rest. She has been to nursing station several times requesting water, blanket and wanting to discuss her situation each time. Writer encouraged her to write her concerns down in her journal but to try and rest. She received her repeat dose of trazadone and and zyprexa for agitation. The more she talks about her being here the more upset she gets. Will monitor effectiveness of medications given. Safety maintained with 15 min checks.

## 2019-10-20 NOTE — BHH Counselor (Signed)
[place] LCSW Note  10/20/2019   1250p  Type of Contact and Topic:  Pt support  CSW met with pt in regards to concerns surrounding being evicted from current residence. CSW obtained apartment name in efforts to contact and relay current hospitalization and request delay due to current admission.   Blane Ohara, LCSWA 10/20/2019  1:10 PM
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
## 2019-10-20 NOTE — Progress Notes (Addendum)
Adult Psychoeducational Group Note  Date:  10/20/2019 Time:  12:51 AM  Group Topic/Focus:  Wrap-Up Group:   The focus of this group is to help patients review their daily goal of treatment and discuss progress on daily workbooks.  Participation Level:  Active  Participation Quality:  Appropriate  Affect:  Anxious and Excited  Cognitive:  Disorganized, Confused, Delusional and Lacking  Insight: Lacking and Limited  Engagement in Group:  Lacking and Limited  Modes of Intervention:  Discussion  Additional Comments:  Pt stated her goal for today was to focus on her treatment plan. Pt stated she felt she accomplished her goal today. Pt stated she felt better about herself today. Pt rated her overall day on a 10. Pt stated her appetite was pretty good today. Pt stated her slept last night was pretty good. Pt stated she was in physical pain. Pt stated she had some pain in both her right and left legs. Pt rated her back pain a 5 out of 10. Pt nurse was made aware of the situation. Pt deny auditory or visual hallucinations. Pt denies thoughts of harming herself or others. Pt stated that she would alert staff if anything changes.  Candy Sledge 10/20/2019, 12:51 AM

## 2019-10-20 NOTE — Progress Notes (Signed)
   10/20/19 2035  COVID-19 Daily Checkoff  Have you had a fever (temp > 37.80C/100F)  in the past 24 hours?  No  If you have had runny nose, nasal congestion, sneezing in the past 24 hours, has it worsened? No  COVID-19 EXPOSURE  Have you traveled outside the state in the past 14 days? No  Have you been in contact with someone with a confirmed diagnosis of COVID-19 or PUI in the past 14 days without wearing appropriate PPE? No  Have you been living in the same home as a person with confirmed diagnosis of COVID-19 or a PUI (household contact)? No  Have you been diagnosed with COVID-19? No

## 2019-10-20 NOTE — Progress Notes (Signed)
Pt said that she wants to go home.  RN let pt know about 72 hour form to request discharge.  Pt then said "Well, I'm really not in that much of a rush to leave."  Pt is worried about losing her belongings at her apartment but then stated that she is not interested in signing the 72 hour form at this time.  RN will continue to monitor and provide support as needed.
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
## 2019-10-20 NOTE — [facility] Counselor (Signed)
[facility] LCSW Note  10/20/2019  1308 Type of Contact and Topic:  CSW Support  CSW contacted [employer], [phone], in order to relay current admission status and request delay in eviction. CSW left HIPPA compliant voicemail, requesting follow up contact be made during office hours for further assistance.   Blane Ohara, LCSWA 10/20/2019  1:12 PM
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
## 2019-10-20 NOTE — Progress Notes (Signed)
Pt presents as paranoid, anxious, irritable, and intrusive.  Pt reluctantly took morning medications.  RN provided validation and reassurance.  Pt was able to be redirected and she is currently in the dayroom attending social work group. RN will continue to encourage pt to take medications as prescribed.  Q 15 min safety checks remain in place.

## 2019-10-20 NOTE — Progress Notes (Signed)
Patient has been up at nursing station complaining about being here and needing to be discharged. She reports that she was joking when she told her mom that she was suicidal and didn't mean it. She is argumentative with Probation officer and keeps wanting to discuss the same thing over and over again. Writer gave patient a journal and asked that she write down her questions and concerns for the doctor. Support given and safety maintained with 15 min checks.

## 2019-10-21 DIAGNOSIS — F22 Delusional disorders: Secondary | ICD-10-CM

## 2019-10-21 MED ORDER — TRAZODONE HCL 100 MG PO TABS
100.0000 mg | ORAL_TABLET | Freq: Every evening | ORAL | Status: DC | PRN
  Administered 2019-10-21 – 2019-10-23 (×2): 100 mg via ORAL
  Filled 2019-10-21 (×4): qty 1

## 2019-10-21 MED ORDER — ZIPRASIDONE MESYLATE 20 MG IM SOLR: 10.0000 mg | INTRAMUSCULAR | Status: DC | PRN

## 2019-10-21 MED ORDER — RISPERIDONE 2 MG PO TBDP
3.0000 mg | ORAL_TABLET | Freq: Two times a day (BID) | ORAL | Status: DC
  Administered 2019-10-21 – 2019-10-22 (×3): 3 mg via ORAL
  Filled 2019-10-21 (×5): qty 1

## 2019-10-21 MED ORDER — OLANZAPINE 10 MG PO TBDP
10.0000 mg | ORAL_TABLET | Freq: Three times a day (TID) | ORAL | Status: DC | PRN
  Administered 2019-10-22 – 2019-10-24 (×2): 10 mg via ORAL
  Filled 2019-10-21 (×3): qty 1
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
## 2019-10-21 NOTE — Progress Notes (Signed)
   10/21/19 2055  COVID-19 Daily Checkoff  Have you had a fever (temp > 37.80C/100F)  in the past 24 hours?  No  If you have had runny nose, nasal congestion, sneezing in the past 24 hours, has it worsened? No  COVID-19 EXPOSURE  Have you traveled outside the state in the past 14 days? No  Have you been in contact with someone with a confirmed diagnosis of COVID-19 or PUI in the past 14 days without wearing appropriate PPE? No  Have you been living in the same home as a person with confirmed diagnosis of COVID-19 or a PUI (household contact)? No  Have you been diagnosed with COVID-19? No

## 2019-10-21 NOTE — Plan of Care (Signed)
Progress note  D: pt found in bed; compliant with medication administration. Pt continues to discuss discharge planning but the conversation is circumstantial. Pt is paranoid about staying and continues to discuss their apartment. Pt was offered a 72h request for discharge again but the pt wants a [employer] officer present to sign. Pt feels as though we are trying to make them worship allah. Pt was provided reassurance. Pt was told what was needed for discharge. Pt verbalized understanding and asked staff to come get them for the first group. Pt denies si/hi/ah/vh and verbally agrees to approach staff if these become apparent or before harming themself/others while at [facility].  A: Pt provided support and encouragement. Pt given medication per protocol and standing orders. Q41m safety checks implemented and continued.  R: Pt safe on the unit. Will continue to monitor.  Pt progressing in the following metrics  Problem: Health Behavior/Discharge Planning: Goal: Compliance with prescribed medication regimen will improve Outcome: Progressing   Problem: Role Relationship: Goal: Ability to communicate needs accurately will improve Outcome: Progressing   Problem: Safety: Goal: Ability to redirect hostility and anger into socially appropriate behaviors will improve Outcome: Progressing Goal: Ability to remain free from injury will improve Outcome: Progressing

## 2019-10-21 NOTE — BHH Group Notes (Signed)
[facility] LCSW Group Therapy Note  Date/Time:  10/21/2019  11:00AM-12:00PM  Type of Therapy and Topic:  Group Therapy:  Music and Mood  Participation Level:  Active   Description of Group: In this process group, members listened to a variety of genres of music and identified that different types of music evoke different responses.  Patients were encouraged to identify music that was soothing for them and music that was energizing for them.  Patients discussed how this knowledge can help with wellness and recovery in various ways including managing depression and anxiety as well as encouraging healthy sleep habits.    Therapeutic Goals: Patients will explore the impact of different varieties of music on mood Patients will verbalize the thoughts they have when listening to different types of music Patients will identify music that is soothing to them as well as music that is energizing to them Patients will discuss how to use this knowledge to assist in maintaining wellness and recovery Patients will explore the use of music as a coping skill  Summary of Patient Progress:  At the beginning of group, patient expressed that her favorite types of music are R&B and Christian.  She was active throughout group in describing how different songs made her feel, showed insight.  At the end of group, patient expressed appreciation.    Therapeutic Modalities: Solution Focused Brief Therapy Activity   Selmer Dominion, LCSW

## 2019-10-21 NOTE — Progress Notes (Signed)
Patient has been observed talking mostly to Mackenzie Williams in the dayroom tonight. She remains hyper-verbal and still concerned about her belongings at her apartment. She reports that she is being evicted. She is not as restless and fidgety as the 2 previous nights. She received medication to aid with her sleep. Support given and safety maintained on unit with 15 min checks.
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
## 2019-10-22 LAB — T3, FREE: T3, Free: 2.9 pg/mL (ref 2.0–4.4)

## 2019-10-22 MED ORDER — RISPERIDONE 2 MG PO TBDP
4.0000 mg | ORAL_TABLET | Freq: Every day | ORAL | Status: DC
  Administered 2019-10-22: 4 mg via ORAL
  Filled 2019-10-22 (×2): qty 2

## 2019-10-22 MED ORDER — RISPERIDONE 1 MG PO TBDP
3.0000 mg | ORAL_TABLET | Freq: Every day | ORAL | Status: DC
  Administered 2019-10-23: 3 mg via ORAL
  Filled 2019-10-22 (×2): qty 3

## 2019-10-22 MED ORDER — BENZTROPINE MESYLATE 1 MG PO TABS
1.0000 mg | ORAL_TABLET | Freq: Two times a day (BID) | ORAL | Status: DC
  Administered 2019-10-22 – 2019-10-23 (×3): 1 mg via ORAL
  Filled 2019-10-22 (×8): qty 1

## 2019-10-22 NOTE — Plan of Care (Signed)
Progress note  D: pt found in bed; compliant with medication administration. Pt states they didn't sleep well as they were "listening to the [place] going on outside of their door". Pt continues to be hesitant to take medication stating that they don't want to be "doped up". Pt also continues to talk about their belongings and has be frequently reoriented to where these are and why they cannot have them on the unit. Pt denies si/hi/ah/vh and verbally agrees to approach staff if these become apparent or before harming themself/others while at [facility].  A: Pt provided support and encouragement. Pt given medication per protocol and standing orders. Q72m safety checks implemented and continued.  R: Pt safe on the unit. Will continue to monitor.  Progressing in the following metrics  Problem: Coping: Goal: Ability to demonstrate self-control will improve Outcome: Progressing   Problem: Health Behavior/Discharge Planning: Goal: Compliance with treatment plan for underlying cause of condition will improve Outcome: Progressing   Problem: Physical Regulation: Goal: Ability to maintain clinical measurements within normal limits will improve Outcome: Progressing   Problem: Safety: Goal: Periods of time without injury will increase Outcome: Progressing
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
## 2019-10-22 NOTE — Progress Notes (Addendum)
Patient ID: Mackenzie Williams, female   DOB: July 18, 1970, 49 y.o.   MRN: [id number] 10/22/2019 at 5, 39 PM Progress Note  Patient reports she cut her finger ( L 5th distally) as she was passing through a door frame on unit. Wound has been cleaned , washed by RN staff. I examined wound- it is a small abrasion on distal 5th finger ( size about 1/4 of an inch)  with some epidermal loss noted. It appears clean , with no visible foreign bodies . Small amount of bleeding. Preserved sense of touch and perfusion distally to abrasion.  She reports she had a tetanus " shot "/booster about 1-2 years ago.  Wound does not appear to require suturing RN staff will continue to monitor, apply local antibiotic cream, bandage .  Patient instructed to inform staff if persistent bleeding, increased pain or any symptoms of infection  F Maeghan Canny, MD

## 2019-10-22 NOTE — Progress Notes (Signed)
Progress note  Staff noticed that pt was bleeding from their L pinky finger while walking in the hallway. pt stated they hit their finger against the door post while coming out of their room. pt had sustained a small skin break. physician  was notified and assessed pt's digit. orders were placed and finger was cleaned with NS. Dressing applied per orders. pt denied any pain. pt continued to ambulate without difficulty using front wheel walker.

## 2019-10-22 NOTE — [facility] Group Notes (Signed)
LCSW Group Therapy Notes 10/22/2019 2:58 PM  Type of Therapy and Topic: Group Therapy: Overcoming Obstacles  Participation Level: Active  Description of Group:  In this group patients will be encouraged to explore what they see as obstacles to their own wellness and recovery. They will be guided to discuss their thoughts, feelings, and behaviors related to these obstacles. The group will process together ways to cope with barriers, with attention given to specific choices patients can make. Each patient will be challenged to identify changes they are motivated to make in order to overcome their obstacles. This group will be process-oriented, with patients participating in exploration of their own experiences as well as giving and receiving support and challenge from other group members.  Therapeutic Goals: 1. Patient will identify personal and current obstacles as they relate to admission. 2. Patient will identify barriers that currently interfere with their wellness or overcoming obstacles.  3. Patient will identify feelings, thought process and behaviors related to these barriers. 4. Patient will identify two changes they are willing to make to overcome these obstacles:   Summary of Patient Progress Mackenzie Williams was very active in group, monopolizing at times. She spoke tangentially and at length about God's return, the end of days, dying and being reborn, and living under surveillance. [name] also shared that she believes the stimulus checks are a sign of the end of days and this "set her over the edge."   Therapeutic Modalities:  Cognitive Behavioral Therapy Solution Focused Therapy Motivational Interviewing Relapse Prevention [place], MSW, [facility] 10/22/2019 2:58 PM

## 2019-10-22 NOTE — Progress Notes (Signed)
Recreation Therapy Notes  Date: 5.3.21 Time: 1000 Location: 500 Hall Dayroom  Group Topic: Coping Skills  Goal Area(s) Addresses:  Patient will identify unhealthy and healthy coping strategies. Patient will identify benefit of using healthy coping strategies.  Behavioral Response: Engaged  Intervention: Worksheet, pencils  Activity: Unhealthy vs. Healthy Coping Strategies.  Patients are to identify a problem they are currently dealing.  Patients then identify the unhealthy coping strategies they have used and the consequences of using them.  Patients then identify the healthy coping strategies, expected outcomes and barriers to using the healthy strategies.  Education: Radiographer, therapeutic, Dentist.   Education Outcome: Acknowledges understanding/In group clarification offered/Needs additional education.   Clinical Observations/Feedback: Pt expressed her current problem was "too deep to get into".  Pt identified unhealthy coping strategies as "being in here", talking because words have power and not making a point until it makes sense.  Pt expressed the consequences of these were being misunderstood and becoming overwhelmed.  Pt identified healthy coping strategies as being positive, think things through, give things time, pay attention to how you respond and be careful how you let things get into you.  Pt expressed barriers to healthy coping were stress, not being able to get point across and not using the right answers.    Victorino Sparrow, LRT/CTRS         Victorino Sparrow A 10/22/2019 12:20 PM
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
## 2019-10-22 NOTE — Tx Team (Signed)
Interdisciplinary Treatment and Diagnostic Plan Update  10/22/2019 Time of Session: 9:00am Mackenzie Williams MRN: [id number]  Principal Diagnosis: <principal problem not specified>  Secondary Diagnoses: Active Problems:   Psychosis ([place])   Current Medications:  Current Facility-Administered Medications  Medication Dose Route Frequency Provider Last Rate Last Admin  . acetaminophen (TYLENOL) tablet 650 mg  650 mg Oral Q6H PRN Suella Broad, FNP      . LORazepam (ATIVAN) tablet 1 mg  1 mg Oral Q6H PRN Connye Burkitt, NP   1 mg at 10/22/19 0830   Or  . LORazepam (ATIVAN) injection 1 mg  1 mg Intramuscular Q6H PRN Connye Burkitt, NP      . magnesium hydroxide (MILK OF MAGNESIA) suspension 30 mL  30 mL Oral Daily PRN Burt Ek Gayland Curry, FNP      . OLANZapine zydis (ZYPREXA) disintegrating tablet 10 mg  10 mg Oral Q8H PRN Connye Burkitt, NP       And  . ziprasidone (GEODON) injection 10 mg  10 mg Intramuscular PRN Connye Burkitt, NP      . risperiDONE (RISPERDAL M-TABS) disintegrating tablet 3 mg  3 mg Oral BID Connye Burkitt, NP   3 mg at 10/22/19 0830  . traZODone (DESYREL) tablet 100 mg  100 mg Oral QHS PRN Connye Burkitt, NP   100 mg at 10/21/19 2051   PTA Medications: Medications Prior to Admission  Medication Sig Dispense Refill Last Dose  . ibuprofen (ADVIL) 400 MG tablet Take 400 mg by mouth every 6 (six) hours as needed for headache or mild pain.     . Multiple Vitamin (MULTIVITAMIN WITH MINERALS) TABS tablet Take 1 tablet by mouth daily.     . famotidine (PEPCID) 40 MG tablet Take 40 mg by mouth at bedtime.  99   . valACYclovir (VALTREX) 500 MG tablet TAKE 1 TABLET BY MOUTH ONCE DAILY NEEDS  TO  BE  SEEN  FOR  ADDITION  REFILLS 90 tablet 3     Patient Stressors: Financial difficulties Health problems Traumatic event  Patient Strengths: Capable of independent living Communication skills Supportive family/friends  Treatment Modalities: Medication Management,  Group therapy, Case management,  1 to 1 session with clinician, Psychoeducation, Recreational therapy.   Physician Treatment Plan for Primary Diagnosis: <principal problem not specified> Long Term Goal(s): Improvement in symptoms so as ready for discharge Improvement in symptoms so as ready for discharge   Short Term Goals: Ability to identify changes in lifestyle to reduce recurrence of condition will improve Ability to verbalize feelings will improve Ability to demonstrate self-control will improve Ability to identify and develop effective coping behaviors will improve  Medication Management: Evaluate patient's response, side effects, and tolerance of medication regimen.  Therapeutic Interventions: 1 to 1 sessions, Unit Group sessions and Medication administration.  Evaluation of Outcomes: Not Met  Physician Treatment Plan for Secondary Diagnosis: Active Problems:   Psychosis ([place])  Long Term Goal(s): Improvement in symptoms so as ready for discharge Improvement in symptoms so as ready for discharge   Short Term Goals: Ability to identify changes in lifestyle to reduce recurrence of condition will improve Ability to verbalize feelings will improve Ability to demonstrate self-control will improve Ability to identify and develop effective coping behaviors will improve     Medication Management: Evaluate patient's response, side effects, and tolerance of medication regimen.  Therapeutic Interventions: 1 to 1 sessions, Unit Group sessions and Medication administration.  Evaluation of Outcomes: Not Met  RN Treatment Plan for Primary Diagnosis: <principal problem not specified> Long Term Goal(s): Knowledge of disease and therapeutic regimen to maintain health will improve  Short Term Goals: Ability to participate in decision making will improve, Ability to verbalize feelings will improve, Ability to identify and develop effective coping behaviors will improve and Compliance with  prescribed medications will improve  Medication Management: RN will administer medications as ordered by provider, will assess and evaluate patient's response and provide education to patient for prescribed medication. RN will report any adverse and/or side effects to prescribing provider.  Therapeutic Interventions: 1 on 1 counseling sessions, Psychoeducation, Medication administration, Evaluate responses to treatment, Monitor vital signs and CBGs as ordered, Perform/monitor CIWA, COWS, AIMS and Fall Risk screenings as ordered, Perform wound care treatments as ordered.  Evaluation of Outcomes: Not Met   LCSW Treatment Plan for Primary Diagnosis: <principal problem not specified> Long Term Goal(s): Safe transition to appropriate next level of care at discharge, Engage patient in therapeutic group addressing interpersonal concerns.  Short Term Goals: Engage patient in aftercare planning with referrals and resources, Increase social support, Facilitate acceptance of mental health diagnosis and concerns, Identify triggers associated with mental health/substance abuse issues and Increase skills for wellness and recovery  Therapeutic Interventions: Assess for all discharge needs, 1 to 1 time with Social worker, Explore available resources and support systems, Assess for adequacy in community support network, Educate family and significant other(s) on suicide prevention, Complete Psychosocial Assessment, Interpersonal group therapy.  Evaluation of Outcomes: Not Met   Progress in Treatment: Attending groups: Yes. Participating in groups: Yes. Taking medication as prescribed: Yes. Toleration medication: Yes. Family/Significant other contact made: No, will contact:  declined SPE. Will contact supports if consents are granted. Patient understands diagnosis: No. Discussing patient identified problems/goals with staff: Yes. Medical problems stabilized or resolved: No. Denies suicidal/homicidal  ideation: Yes. Issues/concerns per patient self-inventory: Yes.  New problem(s) identified: Yes, Describe:  eviction/homelessness, limited social supports.  New Short Term/Long Term Goal(s): medication management for mood stabilization; elimination of SI thoughts; development of comprehensive mental wellness/sobriety plan.  Patient Goals: "Find a piece of mind." Patient spoke at length about the end of times and being under surveillance.   Discharge Plan or Barriers: CSW continuing to assess for appropriate referrals. Patient reports she was evicted from her apartment on 05/01. She declines outpatient follow up at this time.  Reason for Continuation of Hospitalization: Anxiety Delusions  Hallucinations [place] Medication stabilization  Estimated Length of Stay: 5-7 days  Attendees: Patient: Mackenzie Williams 10/22/2019 10:30 AM  Physician:  10/22/2019 10:30 AM  Nursing:  10/22/2019 10:30 AM  RN Care Manager: 10/22/2019 10:30 AM  Social Worker: Stephanie Acre, [place] 10/22/2019 10:30 AM  Recreational Therapist:  10/22/2019 10:30 AM  Other: Marvia Pickles, FNP 10/22/2019 10:30 AM  Other:  10/22/2019 10:30 AM  Other: 10/22/2019 10:30 AM    Scribe for Treatment Team: Joellen Jersey, [place] 10/22/2019 10:30 AM

## 2019-10-23 ENCOUNTER — Ambulatory Visit (HOSPITAL_COMMUNITY): Payer: Medicare Other | Attending: Psychiatry

## 2019-10-23 MED ORDER — SULFAMETHOXAZOLE-TRIMETHOPRIM 800-160 MG PO TABS
1.0000 | ORAL_TABLET | Freq: Two times a day (BID) | ORAL | Status: AC
  Administered 2019-10-23 – 2019-10-30 (×16): 1 via ORAL
  Filled 2019-10-23 (×20): qty 1

## 2019-10-23 MED ORDER — LEVOTHYROXINE SODIUM 50 MCG PO TABS
50.0000 ug | ORAL_TABLET | Freq: Every day | ORAL | Status: DC
  Administered 2019-10-23 – 2019-11-07 (×13): 50 ug via ORAL
  Filled 2019-10-23 (×9): qty 1
  Filled 2019-10-23: qty 2
  Filled 2019-10-23 (×9): qty 1

## 2019-10-23 MED ORDER — VALACYCLOVIR HCL 500 MG PO TABS
500.0000 mg | ORAL_TABLET | Freq: Every day | ORAL | Status: DC
  Administered 2019-10-23 – 2019-11-07 (×16): 500 mg via ORAL
  Filled 2019-10-23 (×19): qty 1

## 2019-10-23 MED ORDER — QUETIAPINE FUMARATE 200 MG PO TABS
200.0000 mg | ORAL_TABLET | Freq: Every day | ORAL | Status: DC
  Administered 2019-10-23: 200 mg via ORAL
  Filled 2019-10-23 (×2): qty 1

## 2019-10-23 MED ORDER — QUETIAPINE FUMARATE 50 MG PO TABS
50.0000 mg | ORAL_TABLET | Freq: Every day | ORAL | Status: DC
  Administered 2019-10-23 – 2019-10-24 (×2): 50 mg via ORAL
  Filled 2019-10-23 (×4): qty 1

## 2019-10-23 NOTE — Progress Notes (Signed)
Recreation Therapy Notes  Date: 5.4.21  Time: 1000 Location: 500 Hall Dayroom  Group Topic: Wellness  Goal Area(s) Addresses:  Patient will define components of whole wellness. Patient will verbalize benefit of whole wellness.  Behavioral Response: Engaged  Intervention: Music   Activity: Exercise.  LRT led group in a series of stretches.  Patients each got the opportunity to lead the group in an exercise of their choice.  Patients were encouraged to pay attention to any pains or strains they have.  Patients were also encouraged to drink water and take breaks if needed.    Education: Wellness, Dentist.   Education Outcome: Acknowledges education/In group clarification offered/Needs additional education.   Clinical Observations/Feedback:  Pt completed all the exercises and modified versions of exercises that were more difficult for her.  Pt was pleasant and active throughout group session.  Pt talked about all of the injuries she has had but she was determined.      Victorino Sparrow, LRT/CTRS     Victorino Sparrow A 10/23/2019 10:44 AM

## 2019-10-23 NOTE — Progress Notes (Signed)
   10/22/19 2308  Psych Admission Type (Psych Patients Only)  Admission Status Voluntary  Psychosocial Assessment  Patient Complaints Irritability  Eye Contact Fair  Facial Expression Animated;Anxious;Pensive;Worried  Affect Anxious;Preoccupied  Speech Logical/coherent;Pressured;Tangential  Interaction Assertive;Dominating;Hypervigilant;Intrusive;Manipulative  Motor Activity Fidgety;Pacing;Restless;Unsteady  Appearance/Hygiene Unremarkable  Behavior Characteristics Appropriate to situation  Mood Irritable  Thought Process  Coherency Disorganized;Loose associations;Tangential  Content Confabulation;Delusions;Obsessions;Preoccupation;Religiosity;Paranoia  Delusions Controlled;Paranoid;Persecutory;Religious  Perception Derealization  Hallucination None reported or observed  Judgment Poor  Confusion Moderate  Danger to Self  Current suicidal ideation? Denies  Danger to Others  Danger to Others None reported or observed  DAR NOTE: Patient presents with anxious affect and depressed mood.  Complained of tooth ache, denied auditory and visual hallucinations.  Pt complained of being held and demanding to discharged as sson as possible. Maintained on routine safety checks.  Medications given as prescribed.  Support and encouragement offered as needed.  Will continue to monitor.

## 2019-10-23 NOTE — Progress Notes (Signed)
   10/23/19 2210  Psych Admission Type (Psych Patients Only)  Admission Status Voluntary  Psychosocial Assessment  Patient Complaints Worrying  Eye Contact Fair  Facial Expression Animated;Anxious;Pensive;Worried  Affect Anxious;Preoccupied  Speech Logical/coherent;Pressured;Tangential  Interaction Assertive;Dominating;Hypervigilant;Intrusive  Motor Activity Fidgety;Pacing;Restless;Unsteady  Appearance/Hygiene Unremarkable  Behavior Characteristics Cooperative;Anxious  Thought Process  Coherency Disorganized;Loose associations;Tangential  Content Preoccupation;Paranoia;Delusions  Delusions Paranoid  Perception WDL  Hallucination None reported or observed  Judgment Poor  Confusion None  Danger to Self  Current suicidal ideation? Denies  Danger to Others  Danger to Others None reported or observed   Pt seen at med window. Pt given nighttime meds. At first pt didn't want to take them because she stated "I don't like taking medication." Pt was persuaded to take them after discussing the indications for each.

## 2019-10-23 NOTE — Progress Notes (Signed)
Pt seen at nurse's station. States that she feels sleepy already and feels like she is slurring her words. Asked this Probation officer to place a note stating that she believes her evening medications are too strong.

## 2019-10-23 NOTE — Progress Notes (Signed)
[facility] MD Progress Note  10/23/2019 1:54 PM Mackenzie Williams  MRN:  [id number] Subjective: Patient is a 49 year old female who presented to the [facility] emergency department on 10/19/2019 with psychotic delusions telling the emergency room staff at that time that "everyone will cease to exist".  She had apparently been living in her apartment for the last 11 years, and was put out today.  The patient was paranoid and did not disclose exactly why.  She was transferred to our facility for evaluation and stabilization.  Objective: Patient is seen and examined.  Patient is a 49 year old female with the above-stated past psychiatric history who is seen in follow-up.  During the interview today she is quite disorganized.  She was alert and oriented x3 but with assistance.  She was at times tangential, paranoid and delusional.  She talks about how her next-door neighbor was spreading rumors about her and agitating her.  She stated that the neighbor came in her home and cause problems.  She is at times hyper religious and tells me that God will have vengeance on me if I do not release her.  She denies having any psychiatric problems, and did take some of her medications with encouragement.  Her vital signs are stable, she is afebrile.  She only slept 4 hours last night.  She apparently has been receiving Risperdal, but it does not appear to have been effective at this point.  Review of her laboratories revealed essentially normal electrolytes, normal lipids, and normal CBC.  Her acetaminophen and salicylate were both negative.  Hemoglobin A1c was 5.8.  Pregnancy test was negative.  Her TSH was elevated at 12.539.  Her T3 was 2.9.  Her free T4 was 0.62.  Her urinalysis showed 6-10 white blood cells, and 21-50 red blood cells.  Repeat urinalysis was refused by the patient.  Blood alcohol was less than 10.  Drug screen was negative.  Her EKG showed a poor baseline, but was a normal sinus rhythm with a normal QTc interval.   Chart reviewed revealed no evidence of thyroid issues in the past.  Her last TSH was done on 04/16/2016 and was normal.  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  She also apparently has a history of herpes.  In a note from 2019 she was taking valacyclovir as well as topiramate.  The topiramate was apparently for headaches.  Principal Problem: Psychosis ([place]) Diagnosis: Principal Problem:   Psychosis ([place])  Total Time spent with patient: 30 minutes  Past Psychiatric History: See admission H&P  Past Medical History:  Past Medical History:  Diagnosis Date  . Asthma   . COPD (chronic obstructive pulmonary disease) ([place])   . Depression   . Esophageal hernia   . Genital herpes   . H. pylori infection 11/2011  . Leg pain, right   . Menorrhagia    [facility]  . MRSA carrier   . Neck pain   . Rosacea   . Seasonal allergies   . Varicose veins     Past Surgical History:  Procedure Laterality Date  . ANKLE SURGERY  1999  . CHOLECYSTECTOMY  04/07/2011   Dr Elenor Quinones dyskinesia  . ESOPHAGOGASTRODUODENOSCOPY  03/02/2012   RMR: small hiatal hernia. Gastric polyp and abnormal gastric mucosa of doubtfull clinical significance- status post biopsy (benign). No explanation for abdominal pain based on today's examination.   [name] [place]  2010  . HIP SURGERY    .  UTERINE FIBROID SURGERY  2011  . UTERINE SEPTUM RESECTION  2011   Family History:  Family History  Problem Relation Age of Onset  . Hypertension Mother   . Heart disease Mother   . Hypertension Father   . Heart disease Father        Heart attack  . Stroke Father   . Liver cancer Father   . Colon cancer Other        Patient does not know beyond mother (mother was adopted)  . Anesthesia problems Neg Hx   . Hypotension Neg Hx   . Malignant hyperthermia Neg Hx   . Pseudochol deficiency Neg Hx    Family Psychiatric  History: See  admission H&P Social History:  Social History   Substance and Sexual Activity  Alcohol Use No  . Alcohol/week: 0.0 standard drinks     Social History   Substance and Sexual Activity  Drug Use Not Currently  . Frequency: 1.0 times per week  . Types: Marijuana, Oxycodone   Comment: quit 5 months ago    Social History   Socioeconomic History  . Marital status: Divorced    Spouse name: Not on file  . Number of children: 0  . Years of education: Not on file  . Highest education level: Not on file  Occupational History  . Occupation: disabled    Fish farm manager: NOT EMPLOYED  Tobacco Use  . Smoking status: Current Every Day Smoker    Packs/day: 0.50    Years: 10.00    Pack years: 5.00    Types: Cigarettes  . Smokeless tobacco: Never Used  Substance and Sexual Activity  . Alcohol use: No    Alcohol/week: 0.0 standard drinks  . Drug use: Not Currently    Frequency: 1.0 times per week    Types: Marijuana, Oxycodone    Comment: quit 5 months ago  . Sexual activity: Yes    Birth control/protection: Other-see comments, None, Condom, Rhythm    Comment: pull-out  Other Topics Concern  . Not on file  Social History Narrative  . Not on file   Social Determinants of Health   Financial Resource Strain:   . Difficulty of Paying Living Expenses:   Food Insecurity:   . Worried About Charity fundraiser in the Last Year:   . Arboriculturist in the Last Year:   Transportation Needs:   . Film/video editor (Medical):   [name] Lack of Transportation (Non-Medical):   Physical Activity:   . Days of Exercise per Week:   . Minutes of Exercise per Session:   Stress:   . Feeling of Stress :   Social Connections:   . Frequency of Communication with Friends and Family:   . Frequency of Social Gatherings with Friends and Family:   . Attends Religious Services:   . Active Member of Clubs or Organizations:   . Attends Archivist Meetings:   [name] Marital Status:    Additional Social  History:                         Sleep: Poor  Appetite:  Fair  Current Medications: Current Facility-Administered Medications  Medication Dose Route Frequency Provider Last Rate Last Admin  . acetaminophen (TYLENOL) tablet 650 mg  650 mg Oral Q6H PRN Suella Broad, FNP   650 mg at 10/22/19 2033  . benztropine (COGENTIN) tablet 1 mg  1 mg Oral BID Money, Lowry Ram, FNP  1 mg at 10/23/19 0823  . levothyroxine (SYNTHROID) tablet 50 mcg  50 mcg Oral Q0600 Sharma Covert, MD      . LORazepam (ATIVAN) tablet 1 mg  1 mg Oral Q6H PRN Connye Burkitt, NP   1 mg at 10/22/19 0830   Or  . LORazepam (ATIVAN) injection 1 mg  1 mg Intramuscular Q6H PRN Connye Burkitt, NP      . magnesium hydroxide (MILK OF MAGNESIA) suspension 30 mL  30 mL Oral Daily PRN Burt Ek Gayland Curry, FNP      . OLANZapine zydis (ZYPREXA) disintegrating tablet 10 mg  10 mg Oral Q8H PRN Connye Burkitt, NP   10 mg at 10/22/19 1248   And  . ziprasidone (GEODON) injection 10 mg  10 mg Intramuscular PRN Connye Burkitt, NP      . QUEtiapine (SEROQUEL) tablet 200 mg  200 mg Oral QHS Sharma Covert, MD      . QUEtiapine (SEROQUEL) tablet 50 mg  50 mg Oral Daily Sharma Covert, MD   50 mg at 10/23/19 1235  . sulfamethoxazole-trimethoprim (BACTRIM DS) 800-160 MG per tablet 1 tablet  1 tablet Oral Q12H Sharma Covert, MD   1 tablet at 10/23/19 1235  . traZODone (DESYREL) tablet 100 mg  100 mg Oral QHS PRN Connye Burkitt, NP   100 mg at 10/21/19 2051    Lab Results: No results found for this or any previous visit (from the past 48 hour(s)).  Blood Alcohol level:  Lab Results  Component Value Date   ETH <10 123456    Metabolic Disorder Labs: Lab Results  Component Value Date   HGBA1C 5.8 (H) 10/20/2019   MPG 119.76 10/20/2019   No results found for: PROLACTIN Lab Results  Component Value Date   CHOL 164 10/20/2019   TRIG 60 10/20/2019   HDL 59 10/20/2019   CHOLHDL 2.8 10/20/2019    VLDL 12 10/20/2019   LDLCALC 93 10/20/2019   LDLCALC 124 (H) 10/08/2016    Physical Findings: AIMS: Facial and Oral Movements Muscles of Facial Expression: None, normal Lips and Perioral Area: None, normal Jaw: None, normal Tongue: None, normal,Extremity Movements Upper (arms, wrists, hands, fingers): None, normal Lower (legs, knees, ankles, toes): None, normal, Trunk Movements Neck, shoulders, hips: None, normal, Overall Severity Severity of abnormal movements (highest score from questions above): None, normal Incapacitation due to abnormal movements: None, normal Patient's awareness of abnormal movements (rate only patient's report): No Awareness, Dental Status Current problems with teeth and/or dentures?: No Does patient usually wear dentures?: No  CIWA:    COWS:     Musculoskeletal: Strength & Muscle Tone: within normal limits Gait & Station: unsteady Patient leans: N/A  Psychiatric Specialty Exam: Physical Exam  Nursing note and vitals reviewed. Constitutional: She appears well-developed and well-nourished.  HENT:  Head: Normocephalic and atraumatic.  Respiratory: Effort normal.  Neurological: She is alert.    Review of Systems  Blood pressure 113/76, pulse 76, temperature 97.9 F (36.6 C), temperature source Oral, resp. rate 18, height 5\' 4"  (1.626 m), weight 56.7 kg, SpO2 100 %.Body mass index is 21.46 kg/m.  General Appearance: Disheveled  Eye Contact:  Good  Speech:  Pressured  Volume:  Increased  Mood:  Anxious, Dysphoric and Irritable  Affect:  Congruent  Thought Process:  Goal Directed and Descriptions of Associations: Loose  Orientation:  Negative  Thought Content:  Delusions, Paranoid Ideation and Rumination  Suicidal Thoughts:  No  Homicidal Thoughts:  No  Memory:  Immediate;   Poor Recent;   Poor Remote;   Poor  Judgement:  Impaired  Insight:  Lacking  Psychomotor Activity:  Increased  Concentration:  Concentration: Poor and Attention Span:  Poor  Recall:  Poor  Fund of Knowledge:  Poor  Language:  Fair  Akathisia:  Negative  Handed:  Right  AIMS (if indicated):     Assets:  Desire for Improvement Resilience  ADL's:  Intact  Cognition:  Impaired,  Moderate  Sleep:  Number of Hours: 4     Treatment Plan Summary: Daily contact with patient to assess and evaluate symptoms and progress in treatment, Medication management and Plan : Patient is seen and examined.  Patient is a 49 year old female with the above-stated past psychiatric history who is seen in follow-up.   Diagnosis: #1 unspecified psychosis versus bipolar disorder first break, #2 history of traumatic brain injury, #3 history of herpes simplex, #4 hypothyroidism, #5 concern for cognitive dysfunction, #6 possible urinary tract infection  Patient is seen in follow-up.  She remains significantly paranoid, hyperreligious and delusional.  She is quite disorganized.  Additionally her cognitive status with regard to orientation is abnormal as well.  The Risperdal does not seem to be helping with regard to sleep or psychosis.  I am going to stop that.  We will start Seroquel 100 mg p.o. daily and 400 mg p.o. nightly.  These will be titrated during the course of hospitalization.  Additionally given the fact that she refuses to give [place] another urine sample for culture I will go on and treat her for urinary tract infection with Bactrim DS 1 tablet p.o. twice daily.  Additionally it looks like she is hypothyroid, and I will will start her on levothyroxine 50 mcg p.o. daily, and see if that has any benefit with regard to her current status.  CT scan of the head has already been ordered, and additionally I have added a carotid Doppler ultrasound.  She is currently declining a CT scan of the head but we will encourage her to attempt to get it done.  I have a feeling that there is vascular dementia or at least some process with regard to her previous traumatic brain injury this led to  cognitive decline.  Additionally traumatic brain injury can lead to secondary mania in the age group in which she is at this point.  She also has a history of herpes simplex, and I will restart her Valtrex at 500 mg p.o. daily.  We really have no further collateral information on her, and she denied having any family members that we could contact.  1.  Stop Cogentin. 2.  Start levothyroxine 50 mcg p.o. daily for hypothyroidism. 3.  Continue Zyprexa and ziprasidone as needed for agitation. 4.  Stop Risperdal. 5.  Start Seroquel 200 mg p.o. nightly and 50 mg p.o. daily. 6.  Bactrim DS 1 tablet p.o. twice daily for urinary tract infection. 7.  Valtrex 500 mg p.o. daily for herpes simplex. 8.  Trazodone 100 mg p.o. nightly as needed insomnia. 9.  CT scan of the head without contrast to assess vascular and neurologic changes. 10.  Carotid ultrasound to assess for possible vascular disease. 11.  Disposition planning-in progress.  Sharma Covert, MD 10/23/2019, 1:54 PM

## 2019-10-23 NOTE — Progress Notes (Signed)
Pt complained pain on the left pinky, tylenol offered but pt declined. Pt requesting for antibiotic for possible infection.

## 2019-10-24 MED ORDER — QUETIAPINE FUMARATE 300 MG PO TABS
300.0000 mg | ORAL_TABLET | Freq: Every day | ORAL | Status: DC
  Administered 2019-10-24: 300 mg via ORAL
  Filled 2019-10-24 (×2): qty 1

## 2019-10-24 NOTE — Progress Notes (Signed)
   10/24/19 2100  Psych Admission Type (Psych Patients Only)  Admission Status Voluntary  Psychosocial Assessment  Patient Complaints Worrying  Eye Contact Fair  Facial Expression Anxious;Worried  Affect Anxious;Appropriate to circumstance;Preoccupied  Speech Logical/coherent;Pressured  Interaction Assertive;Hypervigilant  Motor Activity Unsteady;Restless  Appearance/Hygiene Unremarkable  Behavior Characteristics Cooperative;Anxious  Thought Horticulturist, commercial of ideas;Loose associations  Content Paranoia  Delusions Paranoid  Perception WDL  Hallucination None reported or observed  Judgment Poor  Confusion Mild  Danger to Self  Current suicidal ideation? Denies  Danger to Others  Danger to Others None reported or observed   Pt seen at med window. Pt refusing urine culture and CT scan. Pt states that she is scared of all that radiation and doesn't feel like she needs the scan anyway. Pt states "I have too much metal in my body and I don't want those tests to mess me up."

## 2019-10-24 NOTE — Plan of Care (Signed)
Progress note  D: pt found in bed; compliant with medication administration. Pt states they don't need these though and we are "just drugging them up". Pt denies any physical complaints or pain. Pt continues to ask for items in their locker. Pt provided education again. Pt continues to push for discharge reiterating that they are being evicted. Pt has removed their dressing on their finger which was injured. Pt refused dressing. Pt stated they wanted this open to air. Pt continues to decline CT scan seeming paranoid. Pt provided encouragement. Pt denies si/hi/ah/vh and verbally agrees to approach staff if these become apparent or before harming themself/others while at [facility].  A: Pt provided support and encouragement. Pt given medication per protocol and standing orders. Q66m safety checks implemented and continued.  R: Pt safe on the unit. Will continue to monitor.  Pt progressing in the following metrics  Problem: Education: Goal: Verbalization of understanding the information provided will improve Outcome: Progressing   Problem: Activity: Goal: Sleeping patterns will improve Outcome: Progressing   Problem: Coping: Goal: Ability to demonstrate self-control will improve Outcome: Progressing

## 2019-10-24 NOTE — BHH Group Notes (Signed)
LCSW Aftercare Discharge Planning Group Note  10/24/2019   Type of Group and Topic: Psychoeducational Group: Discharge Planning  Participation Level: Did Not Attend  Description of Group  Discharge planning group reviews patient's anticipated discharge plans and assists patients to anticipate and address any barriers to wellness/recovery in the community. Suicide prevention education is reviewed with patients in group.  Therapeutic Goals  1. Patients will state their anticipated discharge plan and mental health aftercare  2. Patients will identify potential barriers to wellness in the community setting  3. Patients will engage in problem solving, solution focused discussion of ways to anticipate and address barriers to wellness/recovery  Summary of Patient Progress  Plan for Discharge/Comments:  Transportation Means:  Supports:  Therapeutic Modalities:  [place], [place]  10/24/2019 1:55 PM

## 2019-10-24 NOTE — Progress Notes (Signed)
Recreation Therapy Notes  Date: 5.5.21 Time: 1000 Location: 500 Hall Dayroom  Group Topic: Communication  Goal Area(s) Addresses:  Patient will effectively communicate with peers in group.  Patient will verbalize benefit of healthy communication. Patient will verbalize positive effect of healthy communication on post d/c goals.  Patient will identify communication techniques that made activity effective for group.   Behavioral Response: Engaged  Intervention:  Paper, pencils, geometric drawings   Activity: Draw This!:  Three patients volunteered to describe pictures of geometrical shapes to the remaining group.  These three patients were to be as detailed as possible when describing the pictures.  The remaining patients had to draw the pictures as they were described.  If these patients missed any of the instructions, they could only as the presenter to repeat themselves.  They could not ask any detailed questions.  Education: Communication, Discharge Planning  Education Outcome: Acknowledges understanding/In group clarification offered/Needs additional education.   Clinical Observations/Feedback: Pt was attentive but had a hard time keeping up with the instructions as they were given.  Pt wanted each of the presenters to start over.  Pt expressed some of the instructions were confusing.  Pt explained during processing "how you say things can cause confusion".       Victorino Sparrow, LRT/CTRS     Victorino Sparrow A 10/24/2019 11:17 AM

## 2019-10-24 NOTE — Progress Notes (Signed)
[facility] MD Progress Note  10/24/2019 10:15 AM AFTYN HEADLEE  MRN:  [id number] Subjective:  Patient is a 49 year old female who presented to the [facility] emergency department on 10/19/2019 with psychotic delusions telling the emergency room staff at that time that "everyone will cease to exist".  She had apparently been living in her apartment for the last 11 years, and was put out today.  The patient was paranoid and did not disclose exactly why.  She was transferred to our facility for evaluation and stabilization.  Objective: Patient is seen and examined.  Patient is a 49 year old female with the above-stated past psychiatric history who is seen in follow-up.  She is essentially unchanged from yesterday.  She continues to deny any issues.  She refused to get her CT scan of the head done yesterday.  Nursing notes reflect that she had been compliant with medication administration.  She again declined that any of the medications were necessary.  She continues to request discharge.  This is discussed with her at length.  She states that her primary care physician has tested her for thyroid problems as well as other issues, and "I am all normal".  She continues to refuse vital signs.  She did sleep 6.25 hours last night.  She has refused the CT scan, the carotid ultrasound, to give [place] a urine for urine culture.  She was started on antibiotics empirically for her urinary tract infection, and she was also started on levothyroxine for hypothyroidism.  We continue to encourage her for compliance and to have her work-up for her cognitive status.  Principal Problem: Psychosis ([place]) Diagnosis: Principal Problem:   Psychosis ([place])  Total Time spent with patient: 15 minutes  Past Psychiatric History: See admission H&P  Past Medical History:  Past Medical History:  Diagnosis Date  . Asthma   . COPD (chronic obstructive pulmonary disease) ([place])   . Depression   . Esophageal hernia   . Genital herpes   . H.  pylori infection 11/2011  . Leg pain, right   . Menorrhagia    [facility]  . MRSA carrier   . Neck pain   . Rosacea   . Seasonal allergies   . Varicose veins     Past Surgical History:  Procedure Laterality Date  . ANKLE SURGERY  1999  . CHOLECYSTECTOMY  04/07/2011   Dr Elenor Quinones dyskinesia  . ESOPHAGOGASTRODUODENOSCOPY  03/02/2012   RMR: small hiatal hernia. Gastric polyp and abnormal gastric mucosa of doubtfull clinical significance- status post biopsy (benign). No explanation for abdominal pain based on today's examination.   [name] [place]  2010  . HIP SURGERY    . UTERINE FIBROID SURGERY  2011  . UTERINE SEPTUM RESECTION  2011   Family History:  Family History  Problem Relation Age of Onset  . Hypertension Mother   . Heart disease Mother   . Hypertension Father   . Heart disease Father        Heart attack  . Stroke Father   . Liver cancer Father   . Colon cancer Other        Patient does not know beyond mother (mother was adopted)  . Anesthesia problems Neg Hx   . Hypotension Neg Hx   . Malignant hyperthermia Neg Hx   . Pseudochol deficiency Neg Hx    Family Psychiatric  History: See admission H&P Social History:  Social History   Substance and Sexual Activity  Alcohol Use No  .  Alcohol/week: 0.0 standard drinks     Social History   Substance and Sexual Activity  Drug Use Not Currently  . Frequency: 1.0 times per week  . Types: Marijuana, Oxycodone   Comment: quit 5 months ago    Social History   Socioeconomic History  . Marital status: Divorced    Spouse name: Not on file  . Number of children: 0  . Years of education: Not on file  . Highest education level: Not on file  Occupational History  . Occupation: disabled    Fish farm manager: NOT EMPLOYED  Tobacco Use  . Smoking status: Current Every Day Smoker    Packs/day: 0.50    Years: 10.00    Pack years: 5.00    Types: Cigarettes  . Smokeless tobacco: Never Used  Substance  and Sexual Activity  . Alcohol use: No    Alcohol/week: 0.0 standard drinks  . Drug use: Not Currently    Frequency: 1.0 times per week    Types: Marijuana, Oxycodone    Comment: quit 5 months ago  . Sexual activity: Yes    Birth control/protection: Other-see comments, None, Condom, Rhythm    Comment: pull-out  Other Topics Concern  . Not on file  Social History Narrative  . Not on file   Social Determinants of Health   Financial Resource Strain:   . Difficulty of Paying Living Expenses:   Food Insecurity:   . Worried About Charity fundraiser in the Last Year:   . Arboriculturist in the Last Year:   Transportation Needs:   . Film/video editor (Medical):   [name] Lack of Transportation (Non-Medical):   Physical Activity:   . Days of Exercise per Week:   . Minutes of Exercise per Session:   Stress:   . Feeling of Stress :   Social Connections:   . Frequency of Communication with Friends and Family:   . Frequency of Social Gatherings with Friends and Family:   . Attends Religious Services:   . Active Member of Clubs or Organizations:   . Attends Archivist Meetings:   [name] Marital Status:    Additional Social History:                         Sleep: Fair  Appetite:  Fair  Current Medications: Current Facility-Administered Medications  Medication Dose Route Frequency Provider Last Rate Last Admin  . acetaminophen (TYLENOL) tablet 650 mg  650 mg Oral Q6H PRN Suella Broad, FNP   650 mg at 10/22/19 2033  . levothyroxine (SYNTHROID) tablet 50 mcg  50 mcg Oral Q0600 Sharma Covert, MD   50 mcg at 10/24/19 [phone]  . LORazepam (ATIVAN) tablet 1 mg  1 mg Oral Q6H PRN Connye Burkitt, NP   1 mg at 10/23/19 1815   Or  . LORazepam (ATIVAN) injection 1 mg  1 mg Intramuscular Q6H PRN Connye Burkitt, NP      . magnesium hydroxide (MILK OF MAGNESIA) suspension 30 mL  30 mL Oral Daily PRN Burt Ek Gayland Curry, FNP      . OLANZapine zydis (ZYPREXA)  disintegrating tablet 10 mg  10 mg Oral Q8H PRN Connye Burkitt, NP   10 mg at 10/24/19 0809   And  . ziprasidone (GEODON) injection 10 mg  10 mg Intramuscular PRN Connye Burkitt, NP      . QUEtiapine (SEROQUEL) tablet 200 mg  200 mg Oral QHS  Sharma Covert, MD   200 mg at 10/23/19 2015  . QUEtiapine (SEROQUEL) tablet 50 mg  50 mg Oral Daily Sharma Covert, MD   50 mg at 10/24/19 0809  . sulfamethoxazole-trimethoprim (BACTRIM DS) 800-160 MG per tablet 1 tablet  1 tablet Oral Q12H Sharma Covert, MD   1 tablet at 10/24/19 0810  . traZODone (DESYREL) tablet 100 mg  100 mg Oral QHS PRN Connye Burkitt, NP   100 mg at 10/23/19 2015  . valACYclovir (VALTREX) tablet 500 mg  500 mg Oral Daily Sharma Covert, MD   500 mg at 10/24/19 [id number]    Lab Results: No results found for this or any previous visit (from the past 48 hour(s)).  Blood Alcohol level:  Lab Results  Component Value Date   ETH <10 123456    Metabolic Disorder Labs: Lab Results  Component Value Date   HGBA1C 5.8 (H) 10/20/2019   MPG 119.76 10/20/2019   No results found for: PROLACTIN Lab Results  Component Value Date   CHOL 164 10/20/2019   TRIG 60 10/20/2019   HDL 59 10/20/2019   CHOLHDL 2.8 10/20/2019   VLDL 12 10/20/2019   LDLCALC 93 10/20/2019   LDLCALC 124 (H) 10/08/2016    Physical Findings: AIMS: Facial and Oral Movements Muscles of Facial Expression: None, normal Lips and Perioral Area: None, normal Jaw: None, normal Tongue: None, normal,Extremity Movements Upper (arms, wrists, hands, fingers): None, normal Lower (legs, knees, ankles, toes): None, normal, Trunk Movements Neck, shoulders, hips: None, normal, Overall Severity Severity of abnormal movements (highest score from questions above): None, normal Incapacitation due to abnormal movements: None, normal Patient's awareness of abnormal movements (rate only patient's report): No Awareness, Dental Status Current problems with teeth  and/or dentures?: No Does patient usually wear dentures?: No  CIWA:    COWS:     Musculoskeletal: Strength & Muscle Tone: within normal limits Gait & Station: unsteady Patient leans: N/A  Psychiatric Specialty Exam: Physical Exam  Nursing note and vitals reviewed. Constitutional: She is oriented to person, place, and time. She appears well-developed and well-nourished.  HENT:  Head: Normocephalic and atraumatic.  Respiratory: Effort normal.  Neurological: She is alert and oriented to person, place, and time.    Review of Systems  Blood pressure 113/76, pulse 76, temperature 97.9 F (36.6 C), temperature source Oral, resp. rate 18, height 5\' 4"  (1.626 m), weight 56.7 kg, SpO2 100 %.Body mass index is 21.46 kg/m.  General Appearance: Casual  Eye Contact:  Fair  Speech:  Normal Rate  Volume:  Increased  Mood:  Anxious, Dysphoric and Irritable  Affect:  Labile  Thought Process:  Goal Directed and Descriptions of Associations: Circumstantial  Orientation:  Negative  Thought Content:  Delusions, Paranoid Ideation and Rumination  Suicidal Thoughts:  No  Homicidal Thoughts:  No  Memory:  Immediate;   Poor Recent;   Poor Remote;   Poor  Judgement:  Impaired  Insight:  Lacking  Psychomotor Activity:  Increased  Concentration:  Concentration: Fair and Attention Span: Fair  Recall:  Poor  Fund of Knowledge:  Fair  Language:  Fair  Akathisia:  Negative  Handed:  Right  AIMS (if indicated):     Assets:  Desire for Improvement Resilience  ADL's:  Intact  Cognition:  Impaired,  Mild  Sleep:  Number of Hours: 6.25     Treatment Plan Summary: Daily contact with patient to assess and evaluate symptoms and progress in treatment, Medication management  and Plan : Patient is seen and examined.  Patient is a 49 year old female with the above-stated past psychiatric history who is seen in follow-up.   Diagnosis: #1 unspecified psychosis versus bipolar disorder first break, #2 history  of traumatic brain injury, #3 history of herpes simplex, #4 hypothyroidism, #5 concern for cognitive dysfunction, #6 possible urinary tract infection  Patient is essentially unchanged.  It looks like she has been compliant with some of her medications.  She did sleep better with the Seroquel versus the Risperdal which she had been receiving.  I will increase her Seroquel at bedtime to 300 mg p.o. nightly.  No other changes in her medications at this point.  We will continue to encourage her to allow [place] to get the studies on her brain and carotid arteries as well as her urine.  No other changes in her medications.  1.  Continue levothyroxine 50 mcg p.o. daily for hypothyroidism. 2.  Continue lorazepam 1 mg p.o. or IM every 6 hours as needed anxiety or agitation. 3.  Continue Zyprexa Zydis 10 mg or ziprasidone 10 mg IM every 12 hours as needed agitation. 4.  Increase Seroquel to 300 mg p.o. nightly for psychosis. 5.  Stop daytime Seroquel. 6.  Continue Bactrim DS 1 tablet p.o. every 12 hours for urinary tract infection. 7.  Continue trazodone 100 mg p.o. nightly as needed insomnia. 8.  Continue valacyclovir 500 mg p.o. daily for herpes simplex. 9.  Continue to encourage patient to allow [place] to get CT scan of the brain as well as carotid Doppler examination. 10.  Disposition planning-in progress. Sharma Covert, MD 10/24/2019, 10:15 AM

## 2019-10-25 MED ORDER — QUETIAPINE FUMARATE 400 MG PO TABS
400.0000 mg | ORAL_TABLET | Freq: Every day | ORAL | Status: DC
  Filled 2019-10-25 (×2): qty 1

## 2019-10-25 MED ORDER — CARBAMAZEPINE 100 MG PO CHEW
100.0000 mg | CHEWABLE_TABLET | Freq: Two times a day (BID) | ORAL | Status: DC
  Administered 2019-10-25 – 2019-10-27 (×4): 100 mg via ORAL
  Filled 2019-10-25 (×7): qty 1

## 2019-10-25 MED ORDER — CARBAMAZEPINE 100 MG PO CHEW
CHEWABLE_TABLET | ORAL | Status: AC
  Filled 2019-10-25: qty 1

## 2019-10-25 MED ORDER — TRAZODONE HCL 150 MG PO TABS
150.0000 mg | ORAL_TABLET | Freq: Every evening | ORAL | Status: DC | PRN
  Administered 2019-10-28: 150 mg via ORAL
  Filled 2019-10-25: qty 1

## 2019-10-25 NOTE — BHH Counselor (Signed)
CSW discussed disposition and discharge planning with patient on unit.   Patient has expressed concerns regarding being evicted from her apartment and wanting to gather her belongings. CSW inquired if patient has any friends or family who could check on her belongings, patient denies and reiterates a desire to be discharged.  CSW inquired what patient plans to do regarding her living situation. Patient states, "I'll go to a shelter, that's all I need." CSW attempted to discuss a group home referral or placement with patient, as it could offer more safety and security than a shelter. Patient says she has no interest in being referred to a group home.  Stephanie Acre, MSW, LCSW-A Clinical Social Worker [facility] Adult Unit

## 2019-10-25 NOTE — BHH Counselor (Signed)
CSW completed a [facility] APS report with APS Social Worker, Sport and exercise psychologist.  CSW addressed concerns regarding, but not limited to: concerns of harm to self via medical neglect, decision making/ concerns of capacity, unsafe home environment, lack of familial or social supports. CSW also requested an evaluation for guardianship.  Stephanie Acre, MSW, LCSW-A Clinical Social Worker [facility] Adult Unit

## 2019-10-25 NOTE — Progress Notes (Signed)
   10/25/19 2000  Psych Admission Type (Psych Patients Only)  Admission Status Voluntary  Psychosocial Assessment  Patient Complaints Anxiety;Worrying  Eye Contact Fair  Facial Expression Anxious;Worried  Affect Anxious;Preoccupied;Fearful  Speech Logical/coherent;Pressured  Interaction Assertive;Attention-seeking  Motor Activity Unsteady;Restless  Appearance/Hygiene Unremarkable;In scrubs  Behavior Characteristics Cooperative;Anxious;Irritable  Mood Anxious;Suspicious;Fearful;Irritable;Preoccupied  Thought Horticulturist, commercial of ideas;Loose associations  Content Paranoia;Religiosity  Delusions Paranoid;Religious  Perception WDL  Hallucination None reported or observed  Judgment Poor  Confusion Mild  Danger to Self  Current suicidal ideation? Denies  Danger to Others  Danger to Others None reported or observed   Pt seen in dayroom. Pt talking about being afraid of people coming to get here because of the metal in her body. "I have been through hell these past three years. It all started with a phone call. If I hadn't answered that phone, I wouldn't be in this mess. I sent him money I didn't have." When asked who she was speaking about, pt said that [place] "[place]." Pt anxious and fearful of people talking about her and calling her crazy. Believes that people of Muslim descent are watching her and have her tangled up in an [place] religious practice.

## 2019-10-25 NOTE — Progress Notes (Signed)
Pt refused Seroquel this evening. States that the medication puts her down and makes her feel bad. Wanted to take half of the pill, but pt informed that this is not the way it is to be administered. Pt states "I'll talk to the doctor and tell him I'll take it tomorrow but not today."

## 2019-10-25 NOTE — Progress Notes (Signed)
Recreation Therapy Notes  Date: 5.6.21 Time: 0930-1020 Location: 500 Hall Dayroom  Group Topic: Self-Esteem  Goal Area(s) Addresses:  Patient will successfully identify positive attributes about themselves.  Patient will successfully identify benefit of improved self-esteem.   Behavioral Response: None  Intervention: Construction paper, scissors, glue sticks, magazines  Activity: Collage About Me.  Patients were to use the magazines to find words and pictures the highlight things they like, things they want to try, things they have accomplished or things that inspire them.  Patients would then glue the things they found onto the construction paper.  Education:  Self-Esteem, Dentist.   Education Outcome: Acknowledges education/In group clarification offered/Needs additional education  Clinical Observations/Feedback: Pt did not participate.  Pt sat and flipped through some magazines.      Victorino Sparrow, LRT/CTRS   Ria Comment, Aniaya Bacha A 10/25/2019 10:50 AM

## 2019-10-25 NOTE — Progress Notes (Signed)
   10/25/19 0634  Vitals  Temp 97.8 F (36.6 C)  Temp Source Oral  BP 121/74  BP Location Right Arm  BP Method Automatic  Patient Position (if appropriate) Standing  Pulse Rate 72   D:  Patient out in open areas. Patient denies SI/ HI / AVH, but was talking to herself in the dayroom.  Patient took morning medicine.  Patient stated " I need to find out what's going on behind my back."  "Someone is trying to  Program me."  "I want out of here!"  "As soon as I walk out of the room, pss psss people are talking behind my back, this ain't right." A:  Patient took scheduled medicine.  Support and encouragement provided Routine safety checks conducted every 15 minutes. Patient  Informed to notify staff with any concerns.  Safety maintained. R: No adverse drug reactions noted.  Patient contracts for safety.  Patient compliant with medication and treatment plan. Patient angry.   Safety maintained.

## 2019-10-25 NOTE — Progress Notes (Signed)
[facility] MD Progress Note  10/25/2019 11:32 AM Mackenzie Williams  MRN:  [id number] Subjective:  Patient is a 49 year old female who presented to the [facility] emergency department on 10/19/2019 with psychotic delusions telling the emergency room staff at that time that "everyone will cease to exist". She had apparently been living in her apartment for the last 11 years, and was put out today. The patient was paranoid and did not disclose exactly why. She was transferred to our facility for evaluation and stabilization.  Objective: Patient is seen and examined.  Patient is a 49 year old female with the above-stated past psychiatric history who is seen in follow-up.  She has taken her medicines over the last 24 hours, and she still remains significantly paranoid.  She stated that every time she walks away that " people will states she said, she said, she said".  She remains pressured.  She does not except the fact that she has been evicted from her apartment.  She continues to have her cognitive thought processes that are impaired.  She continues to refuse the CT scan or the ultrasound of her carotid arteries.  She continues to state that she has had these issues worked up before, and that she was told they were normal.  Her vital signs are stable, she is afebrile.  She slept 5 hours last night.  She did deny any homicidal or suicidal ideation.  No new laboratories.  Principal Problem: Psychosis ([place]) Diagnosis: Principal Problem:   Psychosis ([place])  Total Time spent with patient: 20 minutes  Past Psychiatric History: See admission H&P  Past Medical History:  Past Medical History:  Diagnosis Date  . Asthma   . COPD (chronic obstructive pulmonary disease) ([place])   . Depression   . Esophageal hernia   . Genital herpes   . H. pylori infection 11/2011  . Leg pain, right   . Menorrhagia    [facility]  . MRSA carrier   . Neck pain   . Rosacea   . Seasonal allergies   . Varicose veins      Past Surgical History:  Procedure Laterality Date  . ANKLE SURGERY  1999  . CHOLECYSTECTOMY  04/07/2011   Dr Elenor Quinones dyskinesia  . ESOPHAGOGASTRODUODENOSCOPY  03/02/2012   RMR: small hiatal hernia. Gastric polyp and abnormal gastric mucosa of doubtfull clinical significance- status post biopsy (benign). No explanation for abdominal pain based on today's examination.   [name] [place]  2010  . HIP SURGERY    . UTERINE FIBROID SURGERY  2011  . UTERINE SEPTUM RESECTION  2011   Family History:  Family History  Problem Relation Age of Onset  . Hypertension Mother   . Heart disease Mother   . Hypertension Father   . Heart disease Father        Heart attack  . Stroke Father   . Liver cancer Father   . Colon cancer Other        Patient does not know beyond mother (mother was adopted)  . Anesthesia problems Neg Hx   . Hypotension Neg Hx   . Malignant hyperthermia Neg Hx   . Pseudochol deficiency Neg Hx    Family Psychiatric  History: See admission H&P Social History:  Social History   Substance and Sexual Activity  Alcohol Use No  . Alcohol/week: 0.0 standard drinks     Social History   Substance and Sexual Activity  Drug Use Not Currently  . Frequency: 1.0 times per week  .  Types: Marijuana, Oxycodone   Comment: quit 5 months ago    Social History   Socioeconomic History  . Marital status: Divorced    Spouse name: Not on file  . Number of children: 0  . Years of education: Not on file  . Highest education level: Not on file  Occupational History  . Occupation: disabled    Fish farm manager: NOT EMPLOYED  Tobacco Use  . Smoking status: Current Every Day Smoker    Packs/day: 0.50    Years: 10.00    Pack years: 5.00    Types: Cigarettes  . Smokeless tobacco: Never Used  Substance and Sexual Activity  . Alcohol use: No    Alcohol/week: 0.0 standard drinks  . Drug use: Not Currently    Frequency: 1.0 times per week    Types: Marijuana, Oxycodone     Comment: quit 5 months ago  . Sexual activity: Yes    Birth control/protection: Other-see comments, None, Condom, Rhythm    Comment: pull-out  Other Topics Concern  . Not on file  Social History Narrative  . Not on file   Social Determinants of Health   Financial Resource Strain:   . Difficulty of Paying Living Expenses:   Food Insecurity:   . Worried About Charity fundraiser in the Last Year:   . Arboriculturist in the Last Year:   Transportation Needs:   . Film/video editor (Medical):   [name] Lack of Transportation (Non-Medical):   Physical Activity:   . Days of Exercise per Week:   . Minutes of Exercise per Session:   Stress:   . Feeling of Stress :   Social Connections:   . Frequency of Communication with Friends and Family:   . Frequency of Social Gatherings with Friends and Family:   . Attends Religious Services:   . Active Member of Clubs or Organizations:   . Attends Archivist Meetings:   [name] Marital Status:    Additional Social History:                         Sleep: Fair  Appetite:  Fair  Current Medications: Current Facility-Administered Medications  Medication Dose Route Frequency Provider Last Rate Last Admin  . acetaminophen (TYLENOL) tablet 650 mg  650 mg Oral Q6H PRN Suella Broad, FNP   650 mg at 10/22/19 2033  . levothyroxine (SYNTHROID) tablet 50 mcg  50 mcg Oral Q0600 Sharma Covert, MD   50 mcg at 10/25/19 [phone]  . LORazepam (ATIVAN) tablet 1 mg  1 mg Oral Q6H PRN Connye Burkitt, NP   1 mg at 10/23/19 1815   Or  . LORazepam (ATIVAN) injection 1 mg  1 mg Intramuscular Q6H PRN Connye Burkitt, NP      . magnesium hydroxide (MILK OF MAGNESIA) suspension 30 mL  30 mL Oral Daily PRN Burt Ek Gayland Curry, FNP      . OLANZapine zydis (ZYPREXA) disintegrating tablet 10 mg  10 mg Oral Q8H PRN Connye Burkitt, NP   10 mg at 10/24/19 0809   And  . ziprasidone (GEODON) injection 10 mg  10 mg Intramuscular PRN Connye Burkitt,  NP      . QUEtiapine (SEROQUEL) tablet 300 mg  300 mg Oral QHS Sharma Covert, MD   300 mg at 10/24/19 2100  . sulfamethoxazole-trimethoprim (BACTRIM DS) 800-160 MG per tablet 1 tablet  1 tablet Oral Q12H Sharma Covert,  MD   1 tablet at 10/25/19 0806  . traZODone (DESYREL) tablet 100 mg  100 mg Oral QHS PRN Connye Burkitt, NP   100 mg at 10/23/19 2015  . valACYclovir (VALTREX) tablet 500 mg  500 mg Oral Daily Sharma Covert, MD   500 mg at 10/25/19 [id number]    Lab Results: No results found for this or any previous visit (from the past 42 hour(s)).  Blood Alcohol level:  Lab Results  Component Value Date   ETH <10 123456    Metabolic Disorder Labs: Lab Results  Component Value Date   HGBA1C 5.8 (H) 10/20/2019   MPG 119.76 10/20/2019   No results found for: PROLACTIN Lab Results  Component Value Date   CHOL 164 10/20/2019   TRIG 60 10/20/2019   HDL 59 10/20/2019   CHOLHDL 2.8 10/20/2019   VLDL 12 10/20/2019   LDLCALC 93 10/20/2019   LDLCALC 124 (H) 10/08/2016    Physical Findings: AIMS: Facial and Oral Movements Muscles of Facial Expression: None, normal Lips and Perioral Area: None, normal Jaw: None, normal Tongue: None, normal,Extremity Movements Upper (arms, wrists, hands, fingers): None, normal Lower (legs, knees, ankles, toes): None, normal, Trunk Movements Neck, shoulders, hips: None, normal, Overall Severity Severity of abnormal movements (highest score from questions above): None, normal Incapacitation due to abnormal movements: None, normal Patient's awareness of abnormal movements (rate only patient's report): No Awareness, Dental Status Current problems with teeth and/or dentures?: No Does patient usually wear dentures?: No  CIWA:    COWS:     Musculoskeletal: Strength & Muscle Tone: within normal limits Gait & Station: shuffle Patient leans: N/A  Psychiatric Specialty Exam: Physical Exam  Nursing note and vitals  reviewed. Constitutional: She is oriented to person, place, and time. She appears well-developed and well-nourished.  HENT:  Head: Normocephalic and atraumatic.  Respiratory: Effort normal.  Neurological: She is alert and oriented to person, place, and time.    Review of Systems  Blood pressure 121/74, pulse 72, temperature 97.8 F (36.6 C), temperature source Oral, resp. rate 18, height 5\' 4"  (1.626 m), weight 56.7 kg, SpO2 100 %.Body mass index is 21.46 kg/m.  General Appearance: Casual  Eye Contact:  Fair  Speech:  Pressured  Volume:  Increased  Mood:  Anxious, Dysphoric and Irritable  Affect:  Labile  Thought Process:  Goal Directed and Descriptions of Associations: Circumstantial  Orientation:  Negative  Thought Content:  Delusions and Hallucinations: Auditory  Suicidal Thoughts:  No  Homicidal Thoughts:  No  Memory:  Immediate;   Poor Recent;   Poor Remote;   Poor  Judgement:  Impaired  Insight:  Lacking  Psychomotor Activity:  Increased  Concentration:  Concentration: Fair and Attention Span: Fair  Recall:  Poor  Fund of Knowledge:  Fair  Language:  Fair  Akathisia:  Negative  Handed:  Right  AIMS (if indicated):     Assets:  Desire for Improvement Resilience  ADL's:  Intact  Cognition:  Impaired,  Moderate  Sleep:  Number of Hours: 5     Treatment Plan Summary: Daily contact with patient to assess and evaluate symptoms and progress in treatment, Medication management and Plan : Patient is seen and examined.  Patient is a 49 year old female with the above-stated past psychiatric history who is seen in follow-up.   Diagnosis: #1 unspecified psychosis versus bipolar disorder first break, #2 history of traumatic brain injury, #3 history of herpes simplex, #4 hypothyroidism, #5 concern for cognitive dysfunction, #6  possible urinary tract infection  Patient is seen in follow-up.  She remains essentially unchanged.  She remains paranoid, and having auditory  hallucinations.  Her Seroquel was increased to 300 mg p.o. nightly last night, and I am going to increase that to 400 mg p.o. nightly.  Also given her lability and the possibility of secondary bipolar disorder I am going to add Tegretol 100 mg p.o. twice daily, and titrate that during the course of the hospitalization.  She has continued to refuse to get the CT scan as well as carotid Dopplers, and as well refused to give [place] a urine for culture.  We will continue her Bactrim for the urinary tract infection, the levothyroxine for probable hypothyroidism, and as well valacyclovir 500 mg p.o. daily for her herpes.  Her mother is apparently not willing to take her in her home, so social work will file an Adult [employer] order, and we will search for possible assisted living versus group home for the patient.  1.  Continue levothyroxine 50 mcg p.o. daily for hypothyroidism. 2.  Increase Seroquel to 400 mg p.o. nightly for paranoia and psychosis. 3.  Continue Bactrim DS 1 tablet p.o. twice daily for urinary tract infection for total of 7 days of treatment. 4.  Increase trazodone 250 mg p.o. nightly as needed insomnia. 5.  Continue valacyclovir 500 mg p.o. daily for herpes infection. 6.  Add Tegretol 100 mg p.o. twice daily for mood stability. 7.  Disposition planning-in progress.  Sharma Covert, MD 10/25/2019, 11:32 AM

## 2019-10-26 MED ORDER — QUETIAPINE FUMARATE 400 MG PO TABS
400.0000 mg | ORAL_TABLET | Freq: Every day | ORAL | Status: DC
  Filled 2019-10-26 (×2): qty 1

## 2019-10-26 MED ORDER — QUETIAPINE FUMARATE 300 MG PO TABS
600.0000 mg | ORAL_TABLET | Freq: Every day | ORAL | Status: DC
  Filled 2019-10-26 (×2): qty 2

## 2019-10-26 NOTE — [facility] Counselor (Addendum)
Update: APS Social Worker is coming to meet with patient face to face this afternoon.  CSW's APS report was accepted by [facility] APS.  Patient's case has been assigned to Education officer, museum, Currie Paris [phone]). CSW left Ms.Tim Lair a detailed voicemail requesting a returned call.  Stephanie Acre, MSW, LCSW-A Clinical Social Worker [facility]

## 2019-10-26 NOTE — Progress Notes (Signed)
[facility] MD Progress Note  10/26/2019 11:41 AM ALY CALIFF  MRN:  [id number] Subjective:  Patient is a 49 year old female who presented to the [facility] emergency department on 10/19/2019 with psychotic delusions telling the emergency room staff at that time that "everyone will cease to exist". She had apparently been living in her apartment for the last 11 years, and was put out today. The patient was paranoid and did not disclose exactly why. She was transferred to our facility for evaluation and stabilization.  Objective: Patient is seen and examined.  Patient is a 49 year old female with the above-stated past psychiatric history is seen in follow-up.  She continues to take her medications inconsistently.  We discussed that today.  I explained to the patient that I am unable to know whether or not she is having side effects, whether she is overmedicated or undermedicated until she takes her medications consistently.  Nursing staff told me that she did not take her evening medications.  She stated that she believed that she had.  I told her that she had not.  I explained to her that without knowing side effects from having the medicines I would not know which medications might be helping, hurting or doing something it would cause a problem.  Social work has contacted Adult Scientist, forensic about the patient.  Clearly she is unable to care for herself because of her memory issues.  Nursing notes reflect that she remains confused at times, paranoid, believing she is in an apartment, believing that someone is still in her purse, and other psychotic and anxiety based issues.  Her vital signs are stable, she is afebrile.  She only slept 3 hours last night, but did not take her Seroquel last night.  No new laboratories.  She has continued to refuse the CT scan of the head as well as the carotid Dopplers.  Principal Problem: Psychosis ([place]) Diagnosis: Principal Problem:   Psychosis ([place])  Total Time spent  with patient: 20 minutes  Past Psychiatric History: See admission H&P  Past Medical History:  Past Medical History:  Diagnosis Date  . Asthma   . COPD (chronic obstructive pulmonary disease) ([place])   . Depression   . Esophageal hernia   . Genital herpes   . H. pylori infection 11/2011  . Leg pain, right   . Menorrhagia    [facility]  . MRSA carrier   . Neck pain   . Rosacea   . Seasonal allergies   . Varicose veins     Past Surgical History:  Procedure Laterality Date  . ANKLE SURGERY  1999  . CHOLECYSTECTOMY  04/07/2011   Dr Elenor Quinones dyskinesia  . ESOPHAGOGASTRODUODENOSCOPY  03/02/2012   RMR: small hiatal hernia. Gastric polyp and abnormal gastric mucosa of doubtfull clinical significance- status post biopsy (benign). No explanation for abdominal pain based on today's examination.   [name] [place]  2010  . HIP SURGERY    . UTERINE FIBROID SURGERY  2011  . UTERINE SEPTUM RESECTION  2011   Family History:  Family History  Problem Relation Age of Onset  . Hypertension Mother   . Heart disease Mother   . Hypertension Father   . Heart disease Father        Heart attack  . Stroke Father   . Liver cancer Father   . Colon cancer Other        Patient does not know beyond mother (mother was adopted)  . Anesthesia problems Neg  Hx   . Hypotension Neg Hx   . Malignant hyperthermia Neg Hx   . Pseudochol deficiency Neg Hx    Family Psychiatric  History: See admission H&P Social History:  Social History   Substance and Sexual Activity  Alcohol Use No  . Alcohol/week: 0.0 standard drinks     Social History   Substance and Sexual Activity  Drug Use Not Currently  . Frequency: 1.0 times per week  . Types: Marijuana, Oxycodone   Comment: quit 5 months ago    Social History   Socioeconomic History  . Marital status: Divorced    Spouse name: Not on file  . Number of children: 0  . Years of education: Not on file  . Highest education level:  Not on file  Occupational History  . Occupation: disabled    Fish farm manager: NOT EMPLOYED  Tobacco Use  . Smoking status: Current Every Day Smoker    Packs/day: 0.50    Years: 10.00    Pack years: 5.00    Types: Cigarettes  . Smokeless tobacco: Never Used  Substance and Sexual Activity  . Alcohol use: No    Alcohol/week: 0.0 standard drinks  . Drug use: Not Currently    Frequency: 1.0 times per week    Types: Marijuana, Oxycodone    Comment: quit 5 months ago  . Sexual activity: Yes    Birth control/protection: Other-see comments, None, Condom, Rhythm    Comment: pull-out  Other Topics Concern  . Not on file  Social History Narrative  . Not on file   Social Determinants of Health   Financial Resource Strain:   . Difficulty of Paying Living Expenses:   Food Insecurity:   . Worried About Charity fundraiser in the Last Year:   . Arboriculturist in the Last Year:   Transportation Needs:   . Film/video editor (Medical):   [name] Lack of Transportation (Non-Medical):   Physical Activity:   . Days of Exercise per Week:   . Minutes of Exercise per Session:   Stress:   . Feeling of Stress :   Social Connections:   . Frequency of Communication with Friends and Family:   . Frequency of Social Gatherings with Friends and Family:   . Attends Religious Services:   . Active Member of Clubs or Organizations:   . Attends Archivist Meetings:   [name] Marital Status:    Additional Social History:                         Sleep: Poor  Appetite:  Fair  Current Medications: Current Facility-Administered Medications  Medication Dose Route Frequency Provider Last Rate Last Admin  . acetaminophen (TYLENOL) tablet 650 mg  650 mg Oral Q6H PRN Suella Broad, FNP   650 mg at 10/26/19 0130  . carbamazepine (TEGRETOL) chewable tablet 100 mg  100 mg Oral BID Sharma Covert, MD   100 mg at 10/26/19 0756  . levothyroxine (SYNTHROID) tablet 50 mcg  50 mcg Oral Q0600  Sharma Covert, MD   50 mcg at 10/25/19 [phone]  . LORazepam (ATIVAN) tablet 1 mg  1 mg Oral Q6H PRN Connye Burkitt, NP   1 mg at 10/26/19 0130   Or  . LORazepam (ATIVAN) injection 1 mg  1 mg Intramuscular Q6H PRN Connye Burkitt, NP      . magnesium hydroxide (MILK OF MAGNESIA) suspension 30 mL  30 mL  Oral Daily PRN Suella Broad, FNP      . OLANZapine zydis (ZYPREXA) disintegrating tablet 10 mg  10 mg Oral Q8H PRN Connye Burkitt, NP   10 mg at 10/24/19 0809   And  . ziprasidone (GEODON) injection 10 mg  10 mg Intramuscular PRN Connye Burkitt, NP      . QUEtiapine (SEROQUEL) tablet 600 mg  600 mg Oral QHS Sharma Covert, MD      . sulfamethoxazole-trimethoprim (BACTRIM DS) 800-160 MG per tablet 1 tablet  1 tablet Oral Q12H Sharma Covert, MD   1 tablet at 10/26/19 0754  . traZODone (DESYREL) tablet 150 mg  150 mg Oral QHS PRN Sharma Covert, MD      . valACYclovir Estell Harpin) tablet 500 mg  500 mg Oral Daily Sharma Covert, MD   500 mg at 10/26/19 [id number]    Lab Results: No results found for this or any previous visit (from the past 75 hour(s)).  Blood Alcohol level:  Lab Results  Component Value Date   ETH <10 123456    Metabolic Disorder Labs: Lab Results  Component Value Date   HGBA1C 5.8 (H) 10/20/2019   MPG 119.76 10/20/2019   No results found for: PROLACTIN Lab Results  Component Value Date   CHOL 164 10/20/2019   TRIG 60 10/20/2019   HDL 59 10/20/2019   CHOLHDL 2.8 10/20/2019   VLDL 12 10/20/2019   LDLCALC 93 10/20/2019   LDLCALC 124 (H) 10/08/2016    Physical Findings: AIMS: Facial and Oral Movements Muscles of Facial Expression: None, normal Lips and Perioral Area: None, normal Jaw: None, normal Tongue: None, normal,Extremity Movements Upper (arms, wrists, hands, fingers): None, normal Lower (legs, knees, ankles, toes): None, normal, Trunk Movements Neck, shoulders, hips: None, normal, Overall Severity Severity of abnormal movements  (highest score from questions above): None, normal Incapacitation due to abnormal movements: None, normal Patient's awareness of abnormal movements (rate only patient's report): No Awareness, Dental Status Current problems with teeth and/or dentures?: No Does patient usually wear dentures?: No  CIWA:    COWS:     Musculoskeletal: Strength & Muscle Tone: within normal limits Gait & Station: shuffle Patient leans: N/A  Psychiatric Specialty Exam: Physical Exam  Nursing note and vitals reviewed. Constitutional: She appears well-developed and well-nourished.  HENT:  Head: Normocephalic and atraumatic.  Respiratory: Effort normal.  Neurological: She is alert.    Review of Systems  Blood pressure 121/74, pulse 72, temperature 97.8 F (36.6 C), temperature source Oral, resp. rate 18, height 5\' 4"  (1.626 m), weight 56.7 kg, SpO2 100 %.Body mass index is 21.46 kg/m.  General Appearance: Disheveled  Eye Contact:  Fair  Speech:  Normal Rate  Volume:  Normal  Mood:  Dysphoric and Confused  Affect:  Congruent  Thought Process:  Goal Directed and Descriptions of Associations: Circumstantial  Orientation:  Negative  Thought Content:  Delusions and Paranoid Ideation  Suicidal Thoughts:  No  Homicidal Thoughts:  No  Memory:  Immediate;   Poor Recent;   Poor Remote;   Poor  Judgement:  Impaired  Insight:  Lacking  Psychomotor Activity:  Normal  Concentration:  Concentration: Fair and Attention Span: Fair  Recall:  Poor  Fund of Knowledge:  Poor  Language:  Fair  Akathisia:  Negative  Handed:  Right  AIMS (if indicated):     Assets:  Desire for Improvement Resilience  ADL's:  Impaired  Cognition:  Impaired,  Moderate  Sleep:  Number of Hours: 3     Treatment Plan Summary: Daily contact with patient to assess and evaluate symptoms and progress in treatment, Medication management and Plan : Patient is seen and examined.  Patient is a 49 year old female with the above-stated  history who is seen in follow-up.  Diagnosis: #1 unspecified psychosis versus bipolar disorder first break, #2 history of traumatic brain injury, #3 history of herpes simplex, #4 hypothyroidism, #5 concern for cognitive dysfunction (probable dementia), #6 possible urinary tract infection  Patient is seen in follow-up.  She did take the Tegretol yesterday, and she does seem to be less irritable today.  She still having episodes of agitation, paranoia and psychosis.  I have again asked her to please be consistent with taking her medicine so at least I can figure out what is helping, with starting, and what may be causing side effects.  Staff stated that she did not take the Seroquel last night.  I had plan to increase it if that was a problem, but it is because of noncompliance.  We will continue to 400 mg p.o. nightly.  Social work has told me that Adult [employer] is coming to evaluate her today.  No change in her medications today, and hopefully we can make some progress here.  1.  Continue Tegretol 100 mg p.o. twice daily for mood stability. 2.  Continue Synthroid 50 mcg p.o. daily for hypothyroidism. 3.  Continue as needed lorazepam 1 mg oral or intramuscular every 6 hours as needed anxiety or agitation. 4.  Continue Zyprexa 10 mg p.o. every 8 hours as needed agitation or ziprasidone 10 mg IM as needed agitation. 5.  Continue Seroquel 400 mg p.o. nightly for psychosis and mood stability. 6.  Continue Bactrim DS 1 tablet p.o. twice daily for 7 days total for urinary tract infection. 7.  Continue trazodone 150 mg p.o. nightly as needed insomnia. 8.  Continue valacyclovir 500 mg p.o. daily for herpes infection. 9.  Adult Protective Services reportedly coming to see patient this p.m. 10.  Disposition planning-in progress. Sharma Covert, MD 10/26/2019, 11:41 AM
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
## 2019-10-26 NOTE — Progress Notes (Signed)
Recreation Therapy Notes  Date: 5.7.21 Time: 1000 Location: 500 Hall Dayroom  Group Topic: Communication, Team Building, Problem Solving  Goal Area(s) Addresses:  Patient will effectively work with peer towards shared goal.  Patient will identify skill used to make activity successful.  Patient will identify how skills used during activity can be used to reach post d/c goals.   Intervention: STEM Activity   Activity: Straw Bridge.  In groups, patients were given 15 straws and 72ft masking tape.  Patients were to build a freestanding bridge that can hold a small puzzle box without falling over.  Education: Education officer, community, Dentist.   Education Outcome: Acknowledges education  Clinical Observations/Feedback:  Pt did not attend group session.    Victorino Sparrow, LRT/CTRS         Victorino Sparrow A 10/26/2019 11:49 AM
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
## 2019-10-26 NOTE — Progress Notes (Signed)
   10/26/19 1400  Psych Admission Type (Psych Patients Only)  Admission Status Voluntary  Psychosocial Assessment  Patient Complaints Anxiety  Eye Contact Fair  Facial Expression Anxious;Worried  Affect Anxious;Preoccupied;Fearful  Speech Logical/coherent;Pressured  Interaction Assertive;Attention-seeking  Motor Activity Unsteady;Restless  Appearance/Hygiene Unremarkable;In scrubs  Behavior Characteristics Cooperative  Mood Anxious  Thought Horticulturist, commercial of ideas;Loose associations  Content Paranoia;Religiosity  Delusions Paranoid;Religious  Perception WDL  Hallucination None reported or observed  Judgment Poor  Confusion Mild  Danger to Self  Current suicidal ideation? Denies  Danger to Others  Danger to Others None reported or observed
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
## 2019-10-26 NOTE — Progress Notes (Signed)
Pt seen at nurse's station. Pt worried about pain in lower abdomen and stinging during urination. Pt fearful it may be a UTI or vaginosis. States she has not been taking care of herself lately like she should. Pt educated again on the importance of a urine sample to discern what is going on so that she can properly be treated. Pt assured this Probation officer that she will provide a urine specimen.  Pt also anxious and given Ativan 1 mg. Pt declined anything for sleep as she states she is scared of taking any medication.

## 2019-10-26 NOTE — Progress Notes (Signed)
Pt refused her synthroid this morning. Stated, "I wish I hadn't come here. I don't know whether the information (thyroid levels) is falsified or not. They said I don't have high cholesterol and my doctor told me I did years ago. I'm gonna skip it this morning. I need to do more research."

## 2019-10-26 NOTE — BHH Counselor (Signed)
[employer] Social Worker completed face to face evaluation with patient on unit. APS Social Worker reports she is going to recommend guardianship evaluation for patient, but the referral is going to be forwarded to [facility], as [place] is patient[place] residence.  CSW will follow up with [facility] APS on Monday.   Stephanie Acre, MSW, LCSW-A Clinical Social Worker [facility] Adult Unit

## 2019-10-26 NOTE — Progress Notes (Addendum)
Patient  attended group but did not  Participate.

## 2019-10-27 MED ORDER — HALOPERIDOL LACTATE 5 MG/ML IJ SOLN
5.0000 mg | Freq: Two times a day (BID) | INTRAMUSCULAR | Status: DC
  Filled 2019-10-27 (×4): qty 1

## 2019-10-27 MED ORDER — HALOPERIDOL LACTATE 5 MG/ML IJ SOLN: 5.0000 mg | Freq: Two times a day (BID) | INTRAMUSCULAR | Status: DC

## 2019-10-27 MED ORDER — CARBAMAZEPINE 100 MG PO CHEW
200.0000 mg | CHEWABLE_TABLET | Freq: Two times a day (BID) | ORAL | Status: DC
  Administered 2019-10-27 – 2019-11-06 (×20): 200 mg via ORAL
  Filled 2019-10-27 (×25): qty 2

## 2019-10-27 MED ORDER — HALOPERIDOL 5 MG PO TABS
10.0000 mg | ORAL_TABLET | Freq: Two times a day (BID) | ORAL | Status: DC
  Administered 2019-10-27 – 2019-10-28 (×2): 10 mg via ORAL
  Filled 2019-10-27 (×5): qty 2

## 2019-10-27 MED ORDER — HALOPERIDOL 5 MG PO TABS
ORAL_TABLET | ORAL | Status: AC
  Filled 2019-10-27: qty 1

## 2019-10-27 MED ORDER — HALOPERIDOL LACTATE 5 MG/ML IJ SOLN
10.0000 mg | Freq: Two times a day (BID) | INTRAMUSCULAR | Status: DC
  Filled 2019-10-27 (×4): qty 2

## 2019-10-27 MED ORDER — HALOPERIDOL 5 MG PO TABS
5.0000 mg | ORAL_TABLET | Freq: Two times a day (BID) | ORAL | Status: DC
  Administered 2019-10-27: 5 mg via ORAL
  Filled 2019-10-27 (×4): qty 1

## 2019-10-27 MED ORDER — HALOPERIDOL 5 MG PO TABS: 5.0000 mg | ORAL_TABLET | Freq: Two times a day (BID) | ORAL | Status: DC

## 2019-10-27 NOTE — BHH Group Notes (Signed)
  [facility] LCSW Group Therapy Note  Date/Time:  10/27/2019 11:15AM-12:00PM  Type of Therapy and Topic:  Group Therapy:  Feelings About Hospitalization  Participation Level:  Active   Description of Group This process group involved patients discussing their feelings related to being hospitalized, as well as the benefits they see to being in the hospital.  These feelings and benefits were itemized.  The group then brainstormed specific ways in which they could seek those same benefits when they discharge and return home.  Therapeutic Goals 1. Patient will identify and describe positive and negative feelings related to hospitalization 2. Patient will verbalize benefits of hospitalization to themselves personally 3. Patients will brainstorm together ways they can obtain similar benefits in the outpatient setting, identify barriers to wellness and possible solutions  Summary of Patient Progress:  The patient proved reluctant initially to engage in discussion, sharing of "Just listening" and "Needing to get through some things" during initial check-ins. Pt expressed her primary feelings about being hospitalized are wishing regular daily activity was an option. Pt reported feeling as thought sitting and sleeping at the level of which she currently is, is not healthy. Pt shared of feeling that medication is not the only means of addressing her current needs, further engaging in processing alternate means of support, such as religion, support groups, and other therapy. Pt actively shared her opinions on past experiences with therapy and feeling that once her thoughts and opinions are shared, "they're out there", and anyone can access them. Pt proved to continue with paranoid thought content surrounding religious beliefs and confidentiality. Pt required frequent redirection/refocus to discussion topic throughout duration of group. Pt proved to acknowledge alternate group members input and proved receptive of CSW  feedback.  Therapeutic Modalities Cognitive Behavioral Therapy Motivational Interviewing    Blane Ohara, [place] 10/27/2019  1:06 PM

## 2019-10-27 NOTE — B[facility] Group Notes (Signed)
Adult Psychoeducational Group Note  Date:  10/27/2019 Time:  12:36 PM  Group Topic/Focus:  Goals Group:   The focus of this group is to help patients establish daily goals to achieve during treatment and discuss how the patient can incorporate goal setting into their daily lives to aide in recovery.  Participation Level:  Active  Participation Quality:  Attentive  Affect:  Flat  Cognitive:  Delusional  Insight: Lacking  Engagement in Group:  Engaged  Modes of Intervention:  Activity, Discussion and Support  Additional Comments:  Pt stated her goal today was to not be upset and to walk away from anyone who is trying to tell her that she is having paranoid thoughts. Believes that people are "lying on me".  Mackenzie Williams 10/27/2019, 12:36 PM

## 2019-10-27 NOTE — Progress Notes (Signed)
[facility] MD Progress Note  10/27/2019 10:50 AM Mackenzie Williams  MRN:  [id number] Subjective:  Patient is a 49 year old female who presented to the [facility] emergency department on 10/19/2019 with psychotic delusions telling the emergency room staff at that time that "everyone will cease to exist". She had apparently been living in her apartment for the last 11 years, and was put out today. The patient was paranoid and did not disclose exactly why. She was transferred to our facility for evaluation and stabilization.  Objective: Patient is seen and examined.  Patient is a 49 year old female with the above-stated past psychiatric history who is seen in follow-up.  Patient is more paranoid and agitated this morning.  Again she has continued to refuse medications.  She continually states "I need to call the police, the real police".  She believes that everyone on the unit is plotting against her, and believes the medications are trying to "poison me".  There is not any note in the chart from the Adult Protective Services evaluation from yesterday.  It is unclear whether or not they evaluated the patient.  Nursing staff reflected that she picks and chooses some of her medications, but overall is just been refusing most everything.  She has refused vital signs.  She did sleep 6.25 hours last night.  No new laboratories.  Principal Problem: Psychosis ([place]) Diagnosis: Principal Problem:   Psychosis ([place])  Total Time spent with patient: 20 minutes  Past Psychiatric History: The admission H&P  Past Medical History:  Past Medical History:  Diagnosis Date  . Asthma   . COPD (chronic obstructive pulmonary disease) ([place])   . Depression   . Esophageal hernia   . Genital herpes   . H. pylori infection 11/2011  . Leg pain, right   . Menorrhagia    [facility]  . MRSA carrier   . Neck pain   . Rosacea   . Seasonal allergies   . Varicose veins     Past Surgical History:  Procedure Laterality Date   . ANKLE SURGERY  1999  . CHOLECYSTECTOMY  04/07/2011   Dr Elenor Quinones dyskinesia  . ESOPHAGOGASTRODUODENOSCOPY  03/02/2012   RMR: small hiatal hernia. Gastric polyp and abnormal gastric mucosa of doubtfull clinical significance- status post biopsy (benign). No explanation for abdominal pain based on today's examination.   [name] [place]  2010  . HIP SURGERY    . UTERINE FIBROID SURGERY  2011  . UTERINE SEPTUM RESECTION  2011   Family History:  Family History  Problem Relation Age of Onset  . Hypertension Mother   . Heart disease Mother   . Hypertension Father   . Heart disease Father        Heart attack  . Stroke Father   . Liver cancer Father   . Colon cancer Other        Patient does not know beyond mother (mother was adopted)  . Anesthesia problems Neg Hx   . Hypotension Neg Hx   . Malignant hyperthermia Neg Hx   . Pseudochol deficiency Neg Hx    Family Psychiatric  History: See admission H&P Social History:  Social History   Substance and Sexual Activity  Alcohol Use No  . Alcohol/week: 0.0 standard drinks     Social History   Substance and Sexual Activity  Drug Use Not Currently  . Frequency: 1.0 times per week  . Types: Marijuana, Oxycodone   Comment: quit 5 months ago    Social  History   Socioeconomic History  . Marital status: Divorced    Spouse name: Not on file  . Number of children: 0  . Years of education: Not on file  . Highest education level: Not on file  Occupational History  . Occupation: disabled    Fish farm manager: NOT EMPLOYED  Tobacco Use  . Smoking status: Current Every Day Smoker    Packs/day: 0.50    Years: 10.00    Pack years: 5.00    Types: Cigarettes  . Smokeless tobacco: Never Used  Substance and Sexual Activity  . Alcohol use: No    Alcohol/week: 0.0 standard drinks  . Drug use: Not Currently    Frequency: 1.0 times per week    Types: Marijuana, Oxycodone    Comment: quit 5 months ago  . Sexual activity: Yes     Birth control/protection: Other-see comments, None, Condom, Rhythm    Comment: pull-out  Other Topics Concern  . Not on file  Social History Narrative  . Not on file   Social Determinants of Health   Financial Resource Strain:   . Difficulty of Paying Living Expenses:   Food Insecurity:   . Worried About Charity fundraiser in the Last Year:   . Arboriculturist in the Last Year:   Transportation Needs:   . Film/video editor (Medical):   [name] Lack of Transportation (Non-Medical):   Physical Activity:   . Days of Exercise per Week:   . Minutes of Exercise per Session:   Stress:   . Feeling of Stress :   Social Connections:   . Frequency of Communication with Friends and Family:   . Frequency of Social Gatherings with Friends and Family:   . Attends Religious Services:   . Active Member of Clubs or Organizations:   . Attends Archivist Meetings:   [name] Marital Status:    Additional Social History:                         Sleep: Fair  Appetite:  Fair  Current Medications: Current Facility-Administered Medications  Medication Dose Route Frequency Provider Last Rate Last Admin  . acetaminophen (TYLENOL) tablet 650 mg  650 mg Oral Q6H PRN Suella Broad, FNP   650 mg at 10/26/19 0130  . carbamazepine (TEGRETOL) chewable tablet 100 mg  100 mg Oral BID Sharma Covert, MD   100 mg at 10/27/19 0804  . haloperidol (HALDOL) tablet 5 mg  5 mg Oral BID Sharma Covert, MD   5 mg at 10/27/19 0915   Or  . haloperidol lactate (HALDOL) injection 5 mg  5 mg Intramuscular BID Sharma Covert, MD      . levothyroxine (SYNTHROID) tablet 50 mcg  50 mcg Oral Q0600 Sharma Covert, MD   50 mcg at 10/27/19 [phone]  . LORazepam (ATIVAN) tablet 1 mg  1 mg Oral Q6H PRN Connye Burkitt, NP   1 mg at 10/26/19 0130   Or  . LORazepam (ATIVAN) injection 1 mg  1 mg Intramuscular Q6H PRN Connye Burkitt, NP      . magnesium hydroxide (MILK OF MAGNESIA) suspension 30 mL   30 mL Oral Daily PRN Starkes-Perry, Gayland Curry, FNP      . sulfamethoxazole-trimethoprim (BACTRIM DS) 800-160 MG per tablet 1 tablet  1 tablet Oral Q12H Sharma Covert, MD   1 tablet at 10/27/19 0804  . traZODone (DESYREL) tablet 150 mg  150 mg Oral QHS PRN Sharma Covert, MD      . valACYclovir Estell Harpin) tablet 500 mg  500 mg Oral Daily Sharma Covert, MD   500 mg at 10/27/19 [id number]    Lab Results: No results found for this or any previous visit (from the past 44 hour(s)).  Blood Alcohol level:  Lab Results  Component Value Date   ETH <10 [id number]    Metabolic Disorder Labs: Lab Results  Component Value Date   HGBA1C 5.8 (H) 10/20/2019   MPG 119.76 10/20/2019   No results found for: PROLACTIN Lab Results  Component Value Date   CHOL 164 10/20/2019   TRIG 60 10/20/2019   HDL 59 10/20/2019   CHOLHDL 2.8 10/20/2019   VLDL 12 10/20/2019   LDLCALC 93 10/20/2019   LDLCALC 124 (H) 10/08/2016    Physical Findings: AIMS: Facial and Oral Movements Muscles of Facial Expression: None, normal Lips and Perioral Area: None, normal Jaw: None, normal Tongue: None, normal,Extremity Movements Upper (arms, wrists, hands, fingers): None, normal Lower (legs, knees, ankles, toes): None, normal, Trunk Movements Neck, shoulders, hips: None, normal, Overall Severity Severity of abnormal movements (highest score from questions above): None, normal Incapacitation due to abnormal movements: None, normal Patient's awareness of abnormal movements (rate only patient's report): No Awareness, Dental Status Current problems with teeth and/or dentures?: No Does patient usually wear dentures?: No  CIWA:    COWS:     Musculoskeletal: Strength & Muscle Tone: within normal limits Gait & Station: shuffle Patient leans: N/A  Psychiatric Specialty Exam: Physical Exam  Nursing note and vitals reviewed. Constitutional: She appears well-developed and well-nourished.  HENT:  Head: Normocephalic  and atraumatic.  Respiratory: Effort normal.  Neurological: She is alert.    Review of Systems  Blood pressure 121/74, pulse 72, temperature 97.8 F (36.6 C), temperature source Oral, resp. rate 18, height 5\' 4"  (1.626 m), weight 56.7 kg, SpO2 100 %.Body mass index is 21.46 kg/m.  General Appearance: Casual  Eye Contact:  Minimal  Speech:  Pressured  Volume:  Increased  Mood:  Angry, Dysphoric and Irritable  Affect:  Labile  Thought Process:  Goal Directed and Descriptions of Associations: Loose  Orientation:  Negative  Thought Content:  Delusions, Hallucinations: Auditory, Paranoid Ideation and Rumination  Suicidal Thoughts:  No  Homicidal Thoughts:  No  Memory:  Immediate;   Poor Recent;   Poor Remote;   Poor  Judgement:  Impaired  Insight:  Lacking  Psychomotor Activity:  Increased  Concentration:  Concentration: Poor and Attention Span: Poor  Recall:  Poor  Fund of Knowledge:  Fair  Language:  Fair  Akathisia:  Negative  Handed:  Right  AIMS (if indicated):     Assets:  Desire for Improvement Resilience  ADL's:  Intact  Cognition:  Impaired,  Moderate  Sleep:  Number of Hours: 6.25     Treatment Plan Summary: Daily contact with patient to assess and evaluate symptoms and progress in treatment, Medication management and Plan : Patient is seen and examined.  Patient is a 49 year old female with the above-stated past psychiatric history who is seen in follow-up.   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  She continues to be intermittently noncompliant with medications.  She remains agitated  and paranoid.  I had hoped to be able to give her either Zyprexa or Seroquel, and she is  not willing to take either 1 of those.  I am switching her medicines today to Haldol 10 mg p.o. or IM twice daily.  We will have to force medications if necessary.  I will get the second opinion if necessary.  I will write my own opinion for that today.  I will also go on and increase her Tegretol to 200 mg p.o. twice daily.  She continues to have no insight or knowledge of the fact that she is homeless, and her family is unable to care for her.  Adult Protective Services is apparently going to come in and examined the patient.  She continues to demand discharge and would rather go to "a homeless shelter".  For her own safety I seriously doubt she could survive in a homeless shelter, and it should be noted that the whole reason why she went to the emergency department in the first place was because she had no place to go.  We will continue the Synthroid, Bactrim, trazodone and valacyclovir.  As soon as a 7-day course is up I will stop the Bactrim.  She has refused a CT scan of the canal as well as a carotid Doppler so the ability to evaluate the source of her dementia at this point is difficult.  1.  Stop Seroquel. 2.  Stop Zyprexa. 3.  Start haloperidol 5 mg p.o. or IM twice daily and titrate.  This is for psychosis and paranoia. 4.  Continue levothyroxine 50 mcg p.o. daily for hypothyroidism. 5.  Continue Bactrim DS 1 tablet p.o. twice daily for urinary tract infection. 6.  Continue valacyclovir 500 mg p.o. daily for herpes infection. 7.  Increase Tegretol to 200 mg p.o. twice daily for mood stability. 8.  Continue trazodone 150 mg p.o. nightly as needed insomnia. 9.  Disposition planning-in progress.  Sharma Covert, MD 10/27/2019, 10:50 AM

## 2019-10-27 NOTE — Progress Notes (Signed)
Pt presents as anxious, delusional, and paranoid- exhibits irritable, demanding behavior, but was compliant with am meds with some encouragement. Pt currently denies SI/HI and AVH Pt remains safe with 15 minute checks. Will continue POC.

## 2019-10-28 MED ORDER — HALOPERIDOL LACTATE 5 MG/ML IJ SOLN
10.0000 mg | Freq: Every day | INTRAMUSCULAR | Status: DC
  Filled 2019-10-28 (×5): qty 2

## 2019-10-28 MED ORDER — PANTOPRAZOLE SODIUM 40 MG PO TBEC
40.0000 mg | DELAYED_RELEASE_TABLET | Freq: Every day | ORAL | Status: DC
  Administered 2019-10-28 – 2019-11-07 (×11): 40 mg via ORAL
  Filled 2019-10-28 (×15): qty 1

## 2019-10-28 MED ORDER — HALOPERIDOL LACTATE 5 MG/ML IJ SOLN
15.0000 mg | Freq: Every day | INTRAMUSCULAR | Status: DC
  Filled 2019-10-28 (×5): qty 3

## 2019-10-28 MED ORDER — HALOPERIDOL 5 MG PO TABS
10.0000 mg | ORAL_TABLET | Freq: Every day | ORAL | Status: DC
  Administered 2019-10-29 – 2019-10-31 (×3): 10 mg via ORAL
  Filled 2019-10-28 (×5): qty 2

## 2019-10-28 MED ORDER — HALOPERIDOL 5 MG PO TABS
15.0000 mg | ORAL_TABLET | Freq: Every day | ORAL | Status: DC
  Administered 2019-10-28 – 2019-10-30 (×3): 15 mg via ORAL
  Filled 2019-10-28 (×5): qty 3

## 2019-10-28 MED ORDER — IBUPROFEN 400 MG PO TABS
400.0000 mg | ORAL_TABLET | Freq: Four times a day (QID) | ORAL | Status: DC | PRN
  Administered 2019-10-28 – 2019-11-06 (×2): 400 mg via ORAL
  Filled 2019-10-28 (×2): qty 1

## 2019-10-28 NOTE — Progress Notes (Signed)
   10/28/19 1500  Psych Admission Type (Psych Patients Only)  Admission Status Voluntary  Psychosocial Assessment  Patient Complaints None  Eye Contact Fair  Facial Expression Anxious  Affect Appropriate to circumstance  Speech Logical/coherent  Interaction Assertive;Demanding  Motor Activity Unsteady  Appearance/Hygiene Unremarkable  Behavior Characteristics Cooperative;Calm  Mood Anxious;Pleasant  Thought Process  Coherency Circumstantial  Content Other (Comment) (rumination)  Delusions Paranoid  Perception WDL  Hallucination None reported or observed  Judgment Poor  Confusion Mild  Danger to Self  Current suicidal ideation? Denies  Danger to Others  Danger to Others None reported or observed

## 2019-10-28 NOTE — Progress Notes (Signed)
Adult Psychoeducational Group Note  Date:  10/28/2019 Time:  11:14 PM   Group Topic/Focus:  Wrap-Up Group:   The focus of this group is to help patients review their daily goal of treatment and discuss progress on daily workbooks.  Participation Level:  Active  Participation Quality:  Appropriate  Affect:  Anxious and Appropriate  Cognitive:  Disorganized and Confused  Insight: Limited  Engagement in Group:  Limited  Modes of Intervention:  Discussion  Additional Comments: Pt stated her goal for today was to focus on her treatment plan. Pt stated she accomplished her goal.  Pt stated she felt better about herself today. Pt rated her overall day on a 8 out of 10. Pt stated her appetite was pretty good today. Pt stated her slept last night was fair. Pt stated she was in physical pain. Pt stated she had a headache earlier today. Pt stated she was in no pain at the moment. Pt nurse was made aware of the situation. Pt deny auditory or visual hallucinations. Pt denies thoughts of harming himself or others. Pt stated that he would alert staff if anything changes.    Candy Sledge 10/28/2019, 11:14 PM

## 2019-10-28 NOTE — Progress Notes (Signed)
   10/27/19 2130  Psych Admission Type (Psych Patients Only)  Admission Status Voluntary  Psychosocial Assessment  Patient Complaints Anxiety  Eye Contact Fair  Facial Expression Anxious  Affect Anxious;Preoccupied  Speech Logical/coherent  Interaction Assertive;Demanding  Motor Activity Unsteady  Appearance/Hygiene Unremarkable  Behavior Characteristics Anxious  Mood Anxious;Preoccupied  Thought Horticulturist, commercial of ideas  Content Paranoia  Delusions Paranoid  Perception WDL  Hallucination None reported or observed  Judgment Poor  Confusion Mild  Danger to Self  Current suicidal ideation? Denies  Danger to Others  Danger to Others None reported or observed

## 2019-10-28 NOTE — Progress Notes (Signed)
[facility] MD Progress Note  10/28/2019 11:16 AM Mackenzie Williams  MRN:  [id number] Subjective:  Patient is a 49 year old female who presented to the [facility] emergency department on 10/19/2019 with psychotic delusions telling the emergency room staff at that time that "everyone will cease to exist". She had apparently been living in her apartment for the last 11 years, and was put out today. The patient was paranoid and did not disclose exactly why. She was transferred to our facility for evaluation and stabilization.  Objective: Patient is seen and examined.  Patient is a 49 year old female with the above-stated past psychiatric history is seen in follow-up.  She is actually doing a little bit better today.  She is more pleasant and apparently has been compliant with medications.  She complains of a headache today.  She still remains disoriented, but a little bit less disorganized.  She still is not sleeping well, and I discussed with her the possibility of increasing the Tegretol.  Hopefully she will take it and be compliant with that.  She has continued to refuse vital signs during the course of the hospitalization.  She only slept 2.5 hours last night.  No new laboratories.  Principal Problem: Psychosis ([place]) Diagnosis: Principal Problem:   Psychosis ([place])  Total Time spent with patient: 20 minutes  Past Psychiatric History: See admission H&P  Past Medical History:  Past Medical History:  Diagnosis Date  . Asthma   . COPD (chronic obstructive pulmonary disease) ([place])   . Depression   . Esophageal hernia   . Genital herpes   . H. pylori infection 11/2011  . Leg pain, right   . Menorrhagia    [facility]  . MRSA carrier   . Neck pain   . Rosacea   . Seasonal allergies   . Varicose veins     Past Surgical History:  Procedure Laterality Date  . ANKLE SURGERY  1999  . CHOLECYSTECTOMY  04/07/2011   Dr Elenor Quinones dyskinesia  . ESOPHAGOGASTRODUODENOSCOPY  03/02/2012   RMR:  small hiatal hernia. Gastric polyp and abnormal gastric mucosa of doubtfull clinical significance- status post biopsy (benign). No explanation for abdominal pain based on today's examination.   [name] [place]  2010  . HIP SURGERY    . UTERINE FIBROID SURGERY  2011  . UTERINE SEPTUM RESECTION  2011   Family History:  Family History  Problem Relation Age of Onset  . Hypertension Mother   . Heart disease Mother   . Hypertension Father   . Heart disease Father        Heart attack  . Stroke Father   . Liver cancer Father   . Colon cancer Other        Patient does not know beyond mother (mother was adopted)  . Anesthesia problems Neg Hx   . Hypotension Neg Hx   . Malignant hyperthermia Neg Hx   . Pseudochol deficiency Neg Hx    Family Psychiatric  History: See admission H&P Social History:  Social History   Substance and Sexual Activity  Alcohol Use No  . Alcohol/week: 0.0 standard drinks     Social History   Substance and Sexual Activity  Drug Use Not Currently  . Frequency: 1.0 times per week  . Types: Marijuana, Oxycodone   Comment: quit 5 months ago    Social History   Socioeconomic History  . Marital status: Divorced    Spouse name: Not on file  . Number of children: 0  .  Years of education: Not on file  . Highest education level: Not on file  Occupational History  . Occupation: disabled    Fish farm manager: NOT EMPLOYED  Tobacco Use  . Smoking status: Current Every Day Smoker    Packs/day: 0.50    Years: 10.00    Pack years: 5.00    Types: Cigarettes  . Smokeless tobacco: Never Used  Substance and Sexual Activity  . Alcohol use: No    Alcohol/week: 0.0 standard drinks  . Drug use: Not Currently    Frequency: 1.0 times per week    Types: Marijuana, Oxycodone    Comment: quit 5 months ago  . Sexual activity: Yes    Birth control/protection: Other-see comments, None, Condom, Rhythm    Comment: pull-out  Other Topics Concern  . Not on file  Social  History Narrative  . Not on file   Social Determinants of Health   Financial Resource Strain:   . Difficulty of Paying Living Expenses:   Food Insecurity:   . Worried About Charity fundraiser in the Last Year:   . Arboriculturist in the Last Year:   Transportation Needs:   . Film/video editor (Medical):   [name] Lack of Transportation (Non-Medical):   Physical Activity:   . Days of Exercise per Week:   . Minutes of Exercise per Session:   Stress:   . Feeling of Stress :   Social Connections:   . Frequency of Communication with Friends and Family:   . Frequency of Social Gatherings with Friends and Family:   . Attends Religious Services:   . Active Member of Clubs or Organizations:   . Attends Archivist Meetings:   [name] Marital Status:    Additional Social History:                         Sleep: Poor  Appetite:  Fair  Current Medications: Current Facility-Administered Medications  Medication Dose Route Frequency Provider Last Rate Last Admin  . acetaminophen (TYLENOL) tablet 650 mg  650 mg Oral Q6H PRN Suella Broad, FNP   650 mg at 10/28/19 [id number]  . carbamazepine (TEGRETOL) chewable tablet 200 mg  200 mg Oral BID Sharma Covert, MD   200 mg at 10/28/19 0727  . [START ON 10/29/2019] haloperidol (HALDOL) tablet 10 mg  10 mg Oral Daily Sharma Covert, MD       Or  . Derrill Memo ON 10/29/2019] haloperidol lactate (HALDOL) injection 10 mg  10 mg Intramuscular Daily Sharma Covert, MD      . haloperidol (HALDOL) tablet 15 mg  15 mg Oral QHS Sharma Covert, MD       Or  . haloperidol lactate (HALDOL) injection 15 mg  15 mg Intramuscular QHS Sharma Covert, MD      . ibuprofen (ADVIL) tablet 400 mg  400 mg Oral Q6H PRN Sharma Covert, MD      . levothyroxine (SYNTHROID) tablet 50 mcg  50 mcg Oral Q0600 Sharma Covert, MD   50 mcg at 10/28/19 0701  . LORazepam (ATIVAN) tablet 1 mg  1 mg Oral Q6H PRN Connye Burkitt, NP   1 mg at  10/26/19 0130   Or  . LORazepam (ATIVAN) injection 1 mg  1 mg Intramuscular Q6H PRN Connye Burkitt, NP      . magnesium hydroxide (MILK OF MAGNESIA) suspension 30 mL  30 mL Oral Daily PRN  Suella Broad, FNP      . pantoprazole (PROTONIX) EC tablet 40 mg  40 mg Oral Daily Sharma Covert, MD      . sulfamethoxazole-trimethoprim (BACTRIM DS) 800-160 MG per tablet 1 tablet  1 tablet Oral Q12H Sharma Covert, MD   1 tablet at 10/28/19 0726  . traZODone (DESYREL) tablet 150 mg  150 mg Oral QHS PRN Sharma Covert, MD      . valACYclovir Estell Harpin) tablet 500 mg  500 mg Oral Daily Sharma Covert, MD   500 mg at 10/28/19 [id number]    Lab Results: No results found for this or any previous visit (from the past 65 hour(s)).  Blood Alcohol level:  Lab Results  Component Value Date   ETH <10 123456    Metabolic Disorder Labs: Lab Results  Component Value Date   HGBA1C 5.8 (H) 10/20/2019   MPG 119.76 10/20/2019   No results found for: PROLACTIN Lab Results  Component Value Date   CHOL 164 10/20/2019   TRIG 60 10/20/2019   HDL 59 10/20/2019   CHOLHDL 2.8 10/20/2019   VLDL 12 10/20/2019   LDLCALC 93 10/20/2019   LDLCALC 124 (H) 10/08/2016    Physical Findings: AIMS: Facial and Oral Movements Muscles of Facial Expression: None, normal Lips and Perioral Area: None, normal Jaw: None, normal Tongue: None, normal,Extremity Movements Upper (arms, wrists, hands, fingers): None, normal Lower (legs, knees, ankles, toes): None, normal, Trunk Movements Neck, shoulders, hips: None, normal, Overall Severity Severity of abnormal movements (highest score from questions above): None, normal Incapacitation due to abnormal movements: None, normal Patient's awareness of abnormal movements (rate only patient's report): No Awareness, Dental Status Current problems with teeth and/or dentures?: No Does patient usually wear dentures?: No  CIWA:    COWS:      Musculoskeletal: Strength & Muscle Tone: within normal limits Gait & Station: normal Patient leans: N/A  Psychiatric Specialty Exam: Physical Exam  Nursing note and vitals reviewed. Constitutional: She appears well-developed and well-nourished.  HENT:  Head: Normocephalic and atraumatic.  Respiratory: Effort normal.  Neurological: She is alert.    Review of Systems  Blood pressure 121/74, pulse 72, temperature 97.8 F (36.6 C), temperature source Oral, resp. rate 18, height 5\' 4"  (1.626 m), weight 56.7 kg, SpO2 100 %.Body mass index is 21.46 kg/m.  General Appearance: Casual  Eye Contact:  Fair  Speech:  Normal Rate  Volume:  Normal  Mood:  Anxious and Irritable  Affect:  Labile  Thought Process:  Goal Directed and Descriptions of Associations: Circumstantial  Orientation:  Negative  Thought Content:  Delusions, Paranoid Ideation and Rumination  Suicidal Thoughts:  No  Homicidal Thoughts:  No  Memory:  Immediate;   Fair Recent;   Fair Remote;   Poor  Judgement:  Impaired  Insight:  Lacking  Psychomotor Activity:  Increased  Concentration:  Concentration: Fair and Attention Span: Fair  Recall:  [employer] of Knowledge:  Fair  Language:  Good  Akathisia:  Negative  Handed:  Right  AIMS (if indicated):     Assets:  Desire for Improvement Resilience  ADL's:  Intact  Cognition:  Impaired,  Moderate  Sleep:  Number of Hours: 2.5     Treatment Plan Summary: Daily contact with patient to assess and evaluate symptoms and progress in treatment, Medication management and Plan : Patient is seen and examined.  Patient is a 49 year old female with the above-stated past psychiatric history who is seen in  follow-up.   Diagnosis: #1 unspecified psychosis versus bipolar disorder first break versus dementia with behavioral complications #2 history of traumatic brain injury, #3 history of herpes simplex, #4 hypothyroidism, #5 concern for cognitive dysfunction(probable vascular  dementia), #6 possible urinary tract infection  Patient is seen in follow-up.  She is slightly better.  Little bit less irritable, and is reportedly compliant with medications.  I will increase her Tegretol to 200 mg p.o. twice daily for mood stability.  She also seems to be doing better with Haldol versus the other antipsychotics.  Because of the sleep issue we will continue the Haldol 10 mg p.o. daily and increase the bedtime to 50 mg p.o. nightly.  She does complain of a headache this morning, and I will add ibuprofen 400 mg p.o. every 6 hours as needed headache.  Hopefully if she decreases in her irritability we can convince her to allow [place] to get the CT scan of her head.  She still has 2 days left of antibiotic treatment, and continues on valacyclovir for her underlying herpes infection.  No other changes in her medicines.  I have reordered her urine culture just in case we can get that to specify the organism in her urine.  1.  Increase Tegretol to 200 mg p.o. twice daily for mood stability. 2.  Increase Haldol to 10 mg p.o. or IM daily and 15 mg p.o. or IM nightly for psychosis and mood stability as well as sleep. 3.  Add ibuprofen 400 mg p.o. every 6 hours as needed headache. 5.  Continue lorazepam 1 mg p.o. or IM every 6 hours as needed significant anxiety or agitation. 6.  Add Protonix 40 mg p.o. daily for reflux and heartburn symptoms. 7.  Continue Bactrim DS 1 tablet p.o. every 12 hours for urinary tract infection for at least 1 more day. 8.  Continue trazodone 150 mg p.o. nightly as needed insomnia. 9.  Continue valacyclovir 500 mg p.o. daily for herpes infection. 10.  Disposition planning-in progress.  Sharma Covert, MD 10/28/2019, 11:16 AM

## 2019-10-28 NOTE — Progress Notes (Signed)
   10/27/19 2130  COVID-19 Daily Checkoff  Have you had a fever (temp > 37.80C/100F)  in the past 24 hours?  No  If you have had runny nose, nasal congestion, sneezing in the past 24 hours, has it worsened? No  COVID-19 EXPOSURE  Have you traveled outside the state in the past 14 days? No  Have you been in contact with someone with a confirmed diagnosis of COVID-19 or PUI in the past 14 days without wearing appropriate PPE? No  Have you been living in the same home as a person with confirmed diagnosis of COVID-19 or a PUI (household contact)? No  Have you been diagnosed with COVID-19? No

## 2019-10-29 ENCOUNTER — Encounter (HOSPITAL_COMMUNITY): Payer: Medicare Other

## 2019-10-29 NOTE — Progress Notes (Signed)
   10/29/19 1000  Psych Admission Type (Psych Patients Only)  Admission Status Voluntary  Psychosocial Assessment  Patient Complaints Worrying  Eye Contact Fair  Facial Expression Anxious  Affect Appropriate to circumstance  Speech Logical/coherent  Interaction Minimal  Motor Activity Unsteady  Appearance/Hygiene Unremarkable  Behavior Characteristics Cooperative  Mood Pleasant  Thought Process  Coherency Circumstantial  Content Other (Comment) (rumination)  Delusions Paranoid  Perception WDL  Hallucination None reported or observed  Judgment Poor  Confusion Mild  Danger to Self  Current suicidal ideation? Denies  Danger to Others  Danger to Others None reported or observed

## 2019-10-29 NOTE — Progress Notes (Signed)
   10/28/19 2100  Psych Admission Type (Psych Patients Only)  Admission Status Voluntary  Psychosocial Assessment  Patient Complaints Worrying  Eye Contact Fair  Facial Expression Anxious  Affect Appropriate to circumstance  Speech Logical/coherent  Interaction Minimal  Motor Activity Unsteady  Appearance/Hygiene Unremarkable  Behavior Characteristics Cooperative;Calm  Mood Pleasant  Thought Process  Coherency Circumstantial  Content Other (Comment) (rumination)  Delusions Paranoid  Perception WDL  Hallucination None reported or observed  Judgment Poor  Confusion Mild  Danger to Self  Current suicidal ideation? Denies  Danger to Others  Danger to Others None reported or observed

## 2019-10-29 NOTE — BHH Counselor (Signed)
CSW contacted [place] [phone]). APS supervisor confirms that [place] has begun transferring information related to patient but that patient's report has not been "fully accepted" by [facility] APS at this time.  CSW provided contact information along with TOC Supervisor's contact information for further follow up.  CSW following for a safe disposition.  Stephanie Acre, MSW, LCSW-A Clinical Social Worker [facility]

## 2019-10-29 NOTE — BHH Counselor (Signed)
CSW attempted to reach [place], [place] [phone]). Ms.Arias completed a face to face evaluation on Friday, 05/07 and recommended guardianship referral. CSW left Ms.Tim Lair a detailed voicemail requesting a returned call.  Stephanie Acre, MSW, LCSW-A Clinical Social Worker [facility]

## 2019-10-29 NOTE — Progress Notes (Signed)
Called RN regarding carotid artery duplex order- states patient is refusing. Will d/c order.  10/29/2019 9:30 AM Kelby Aline., MHA, RVT, RDCS, RDMS

## 2019-10-29 NOTE — Progress Notes (Signed)
   10/28/19 2100  COVID-19 Daily Checkoff  Have you had a fever (temp > 37.80C/100F)  in the past 24 hours?  No  If you have had runny nose, nasal congestion, sneezing in the past 24 hours, has it worsened? No  COVID-19 EXPOSURE  Have you traveled outside the state in the past 14 days? No  Have you been in contact with someone with a confirmed diagnosis of COVID-19 or PUI in the past 14 days without wearing appropriate PPE? No  Have you been living in the same home as a person with confirmed diagnosis of COVID-19 or a PUI (household contact)? No  Have you been diagnosed with COVID-19? No

## 2019-10-29 NOTE — Progress Notes (Signed)
   10/29/19 2020  Psych Admission Type (Psych Patients Only)  Admission Status Voluntary  Psychosocial Assessment  Patient Complaints Worrying  Eye Contact Fair  Facial Expression Anxious  Affect Appropriate to circumstance  Speech Logical/coherent  Interaction Minimal  Motor Activity Unsteady  Appearance/Hygiene Unremarkable  Behavior Characteristics Cooperative;Anxious  Mood Pleasant  Thought Process  Coherency Circumstantial  Content Religiosity  Delusions Paranoid  Perception WDL  Hallucination None reported or observed  Judgment Poor  Confusion Mild  Danger to Self  Current suicidal ideation? Denies  Danger to Others  Danger to Others None reported or observed

## 2019-10-29 NOTE — Progress Notes (Signed)
[facility] MD Progress Note  10/29/2019 1:14 PM Mackenzie Williams  MRN:  [id number]   Subjective:  Patient is a 49 year old female who presented to the [facility] emergency department on 10/19/2019 with psychotic delusions telling the emergency room staff at that time that "everyone will cease to exist". She had apparently been living in her apartment for the last 11 years, and was put out today. The patient was paranoid and did not disclose exactly why. She was transferred to our facility for evaluation and stabilization.  Patient reports today that she feels that she is doing just fine today.  She denies any suicidal or homicidal ideations and denies any hallucinations.  She is able to communicate to me that she realizes that she is waiting on APS to assist her with housing so that she can discharge from the hospital and that she is not capable of going back and caring for herself.  She denies any medication side effects and reports that the medication seems to be helping her quite a bit.  She reports that she is just feeling good today and has no complaints.  Principal Problem: Psychosis ([place]) Diagnosis: Principal Problem:   Psychosis ([place])  Total Time spent with patient: 20 minutes  Past Psychiatric History: Denies  Past Medical History:  Past Medical History:  Diagnosis Date  . Asthma   . COPD (chronic obstructive pulmonary disease) ([place])   . Depression   . Esophageal hernia   . Genital herpes   . H. pylori infection 11/2011  . Leg pain, right   . Menorrhagia    [facility]  . MRSA carrier   . Neck pain   . Rosacea   . Seasonal allergies   . Varicose veins     Past Surgical History:  Procedure Laterality Date  . ANKLE SURGERY  1999  . CHOLECYSTECTOMY  04/07/2011   Dr Elenor Quinones dyskinesia  . ESOPHAGOGASTRODUODENOSCOPY  03/02/2012   RMR: small hiatal hernia. Gastric polyp and abnormal gastric mucosa of doubtfull clinical significance- status post biopsy (benign). No  explanation for abdominal pain based on today's examination.   [name] [place]  2010  . HIP SURGERY    . UTERINE FIBROID SURGERY  2011  . UTERINE SEPTUM RESECTION  2011   Family History:  Family History  Problem Relation Age of Onset  . Hypertension Mother   . Heart disease Mother   . Hypertension Father   . Heart disease Father        Heart attack  . Stroke Father   . Liver cancer Father   . Colon cancer Other        Patient does not know beyond mother (mother was adopted)  . Anesthesia problems Neg Hx   . Hypotension Neg Hx   . Malignant hyperthermia Neg Hx   . Pseudochol deficiency Neg Hx    Family Psychiatric  History: Denies Social History:  Social History   Substance and Sexual Activity  Alcohol Use No  . Alcohol/week: 0.0 standard drinks     Social History   Substance and Sexual Activity  Drug Use Not Currently  . Frequency: 1.0 times per week  . Types: Marijuana, Oxycodone   Comment: quit 5 months ago    Social History   Socioeconomic History  . Marital status: Divorced    Spouse name: Not on file  . Number of children: 0  . Years of education: Not on file  . Highest education level: Not on file  Occupational History  . Occupation: disabled    Fish farm manager: NOT EMPLOYED  Tobacco Use  . Smoking status: Current Every Day Smoker    Packs/day: 0.50    Years: 10.00    Pack years: 5.00    Types: Cigarettes  . Smokeless tobacco: Never Used  Substance and Sexual Activity  . Alcohol use: No    Alcohol/week: 0.0 standard drinks  . Drug use: Not Currently    Frequency: 1.0 times per week    Types: Marijuana, Oxycodone    Comment: quit 5 months ago  . Sexual activity: Yes    Birth control/protection: Other-see comments, None, Condom, Rhythm    Comment: pull-out  Other Topics Concern  . Not on file  Social History Narrative  . Not on file   Social Determinants of Health   Financial Resource Strain:   . Difficulty of Paying Living Expenses:    Food Insecurity:   . Worried About Charity fundraiser in the Last Year:   . Arboriculturist in the Last Year:   Transportation Needs:   . Film/video editor (Medical):   [name] Lack of Transportation (Non-Medical):   Physical Activity:   . Days of Exercise per Week:   . Minutes of Exercise per Session:   Stress:   . Feeling of Stress :   Social Connections:   . Frequency of Communication with Friends and Family:   . Frequency of Social Gatherings with Friends and Family:   . Attends Religious Services:   . Active Member of Clubs or Organizations:   . Attends Archivist Meetings:   [name] Marital Status:    Additional Social History:                         Sleep: Good  Appetite:  Good  Current Medications: Current Facility-Administered Medications  Medication Dose Route Frequency Provider Last Rate Last Admin  . acetaminophen (TYLENOL) tablet 650 mg  650 mg Oral Q6H PRN Suella Broad, FNP   650 mg at 10/28/19 [id number]  . carbamazepine (TEGRETOL) chewable tablet 200 mg  200 mg Oral BID Sharma Covert, MD   200 mg at 10/29/19 [id number]  . haloperidol (HALDOL) tablet 10 mg  10 mg Oral Daily Sharma Covert, MD   10 mg at 10/29/19 [id number]   Or  . haloperidol lactate (HALDOL) injection 10 mg  10 mg Intramuscular Daily Sharma Covert, MD      . haloperidol (HALDOL) tablet 15 mg  15 mg Oral QHS Sharma Covert, MD   15 mg at 10/28/19 2058   Or  . haloperidol lactate (HALDOL) injection 15 mg  15 mg Intramuscular QHS Sharma Covert, MD      . ibuprofen (ADVIL) tablet 400 mg  400 mg Oral Q6H PRN Sharma Covert, MD   400 mg at 10/28/19 1730  . levothyroxine (SYNTHROID) tablet 50 mcg  50 mcg Oral Q0600 Sharma Covert, MD   50 mcg at 10/29/19 [phone]  . LORazepam (ATIVAN) tablet 1 mg  1 mg Oral Q6H PRN Connye Burkitt, NP   1 mg at 10/26/19 0130   Or  . LORazepam (ATIVAN) injection 1 mg  1 mg Intramuscular Q6H PRN Connye Burkitt, NP      . magnesium  hydroxide (MILK OF MAGNESIA) suspension 30 mL  30 mL Oral Daily PRN Burt Ek Gayland Curry, FNP      . pantoprazole (PROTONIX)  EC tablet 40 mg  40 mg Oral Daily Sharma Covert, MD   40 mg at 10/29/19 [id number]  . sulfamethoxazole-trimethoprim (BACTRIM DS) 800-160 MG per tablet 1 tablet  1 tablet Oral Q12H Sharma Covert, MD   1 tablet at 10/29/19 [phone]  . traZODone (DESYREL) tablet 150 mg  150 mg Oral QHS PRN Sharma Covert, MD   150 mg at 10/28/19 2058  . valACYclovir (VALTREX) tablet 500 mg  500 mg Oral Daily Sharma Covert, MD   500 mg at 10/29/19 [id number]    Lab Results: No results found for this or any previous visit (from the past 48 hour(s)).  Blood Alcohol level:  Lab Results  Component Value Date   ETH <10 123456    Metabolic Disorder Labs: Lab Results  Component Value Date   HGBA1C 5.8 (H) 10/20/2019   MPG 119.76 10/20/2019   No results found for: PROLACTIN Lab Results  Component Value Date   CHOL 164 10/20/2019   TRIG 60 10/20/2019   HDL 59 10/20/2019   CHOLHDL 2.8 10/20/2019   VLDL 12 10/20/2019   LDLCALC 93 10/20/2019   LDLCALC 124 (H) 10/08/2016    Physical Findings: AIMS: Facial and Oral Movements Muscles of Facial Expression: None, normal Lips and Perioral Area: None, normal Jaw: None, normal Tongue: None, normal,Extremity Movements Upper (arms, wrists, hands, fingers): None, normal Lower (legs, knees, ankles, toes): None, normal, Trunk Movements Neck, shoulders, hips: None, normal, Overall Severity Severity of abnormal movements (highest score from questions above): None, normal Incapacitation due to abnormal movements: None, normal Patient's awareness of abnormal movements (rate only patient's report): No Awareness, Dental Status Current problems with teeth and/or dentures?: No Does patient usually wear dentures?: No  CIWA:    COWS:     Musculoskeletal: Strength & Muscle Tone: within normal limits Gait & Station: Uses walker for  stability Patient leans: N/A  Psychiatric Specialty Exam: Physical Exam  Nursing note and vitals reviewed. Constitutional: She is oriented to person, place, and time. She appears well-developed and well-nourished.  Cardiovascular: Normal rate.  Respiratory: Effort normal.  Musculoskeletal:        General: Normal range of motion.  Neurological: She is alert and oriented to person, place, and time.  Skin: Skin is warm.    Review of Systems  Constitutional: Negative.   HENT: Negative.   Eyes: Negative.   Respiratory: Negative.   Cardiovascular: Negative.   Gastrointestinal: Negative.   Genitourinary: Negative.   Musculoskeletal: Negative.   Skin: Negative.   Neurological: Negative.   Psychiatric/Behavioral: Negative.     Blood pressure 121/74, pulse 72, temperature 97.8 F (36.6 C), temperature source Oral, resp. rate 18, height 5\' 4"  (1.626 m), weight 56.7 kg, SpO2 100 %.Body mass index is 21.46 kg/m.  General Appearance: Casual  Eye Contact:  Good  Speech:  Clear and Coherent and Normal Rate  Volume:  Normal  Mood:  Euthymic  Affect:  Congruent  Thought Process:  Coherent and Descriptions of Associations: Intact  Orientation:  Full (Time, Place, and Person)  Thought Content:  WDL  Suicidal Thoughts:  No  Homicidal Thoughts:  No  Memory:  Immediate;   Fair Recent;   Fair Remote;   Fair  Judgement:  Fair  Insight:  Fair  Psychomotor Activity:  Normal  Concentration:  Concentration: Fair  Recall:  [employer] of Knowledge:  Fair  Language:  Fair  Akathisia:  No  Handed:  Right  AIMS (if indicated):  Assets:  Communication Skills Desire for Improvement Financial Resources/Insurance Housing Resilience Social Support  ADL's:  Intact  Cognition:  WNL  Sleep:  Number of Hours: 6.5   Assessment: Patient presents in the day room has been seen interacting with peers and staff appropriately.  Patient is pleasant, calm, cooperative and her conversation and thought  process seemed more logical than when I saw her last week.  Patient is been attending groups and has been compliant with medications and treatment.  Patient appears to be at baseline for potential discharge, however, APS saw the patient on Friday and we are waiting for placement arrangements through APS and potential guardian.  Treatment Plan Summary: Daily contact with patient to assess and evaluate symptoms and progress in treatment and Medication management Continue Tegretol 200 mg p.o. twice daily for mood stability Continue Haldol 10 mg p.o. daily and 50 mg p.o. nightly for psychosis Continue Synthroid 50 mcg p.o. daily for hypothyroidism Continue Protonix 40 mg p.o. daily for GERD Continue Bactrim for 3 more doses and then discontinue Continue trazodone 150 mg p.o. nightly as needed for insomnia Continue Valtrex 500 mg p.o. daily Continue every 15 minute safety checks Encourage group therapy participation  Lewis Shock, FNP 10/29/2019, 1:14 PM

## 2019-10-29 NOTE — [facility] Suicide Risk Assessment (Signed)
[facility] INPATIENT:  Family/Significant Other Suicide Prevention Education  Suicide Prevention Education:  Patient Refusal for Family/Significant Other Suicide Prevention Education: The patient Mackenzie Williams has refused to provide written consent for family/significant other to be provided Family/Significant Other Suicide Prevention Education during admission and/or prior to discharge.  Physician notified.  Joellen Jersey 10/29/2019, 10:56 AM

## 2019-10-29 NOTE — Progress Notes (Signed)
Recreation Therapy Notes  Date: 5.10.21 Time: 1000 Location: 500 Hall Dayroom  Group Topic: Triggers  Goal Area(s) Addresses:  Patient will identify triggers. Patient will identify coping skills to deal with triggers.  Behavioral Response: None  Intervention: Worksheet, pencils  Activity: Triggers.  Patients were to identify their three biggest triggers, how they avoid their triggers and how they deal with triggers head on when they can't be avoided.  Education:Communication, Discharge Planning  Education Outcome: Acknowledges understanding/In group clarification offered/Needs additional education.   Clinical Observations/Feedback: Pt came for the last few minutes of group.  Pt sat and observed.    Victorino Sparrow, LRT/CTRS        Victorino Sparrow A 10/29/2019 11:25 AM

## 2019-10-30 NOTE — Progress Notes (Signed)
[facility] MD Progress Note  10/30/2019 10:45 AM Mackenzie Williams  MRN:  [id number]   Evaluation: Mackenzie Williams was observed sitting on the side of the bed.  She is awake, alert and oriented x3.  Denying suicidal or homicidal ideations.  Denies auditory or visual hallucinations.  She remains focused on discharge and retrieving her belongings as she reports she was kicked out of where she was residing.  Stated " I have to find my wallet so I can pay my rent" she denied paranoia or depressive symptoms.  Reports she has been taking and tolerating medications well.  Patient to continue Tegretol and Haldol.  Denied any medication side effects.  Patient reports good appetite states she is resting well throughout the night.  Chart review patient continues to wait for APS regarding housing.  Staff to continue to monitor for safety.  Support, encouragement and reassurance was provided.   Principal Problem: Psychosis ([place]) Diagnosis: Principal Problem:   Psychosis ([place])  Total Time spent with patient: 20 minutes  Past Psychiatric History: Denies  Past Medical History:  Past Medical History:  Diagnosis Date  . Asthma   . COPD (chronic obstructive pulmonary disease) ([place])   . Depression   . Esophageal hernia   . Genital herpes   . H. pylori infection 11/2011  . Leg pain, right   . Menorrhagia    [facility]  . MRSA carrier   . Neck pain   . Rosacea   . Seasonal allergies   . Varicose veins     Past Surgical History:  Procedure Laterality Date  . ANKLE SURGERY  1999  . CHOLECYSTECTOMY  04/07/2011   Dr Elenor Quinones dyskinesia  . ESOPHAGOGASTRODUODENOSCOPY  03/02/2012   RMR: small hiatal hernia. Gastric polyp and abnormal gastric mucosa of doubtfull clinical significance- status post biopsy (benign). No explanation for abdominal pain based on today's examination.   [name] [place]  2010  . HIP SURGERY    . UTERINE FIBROID SURGERY  2011  . UTERINE SEPTUM RESECTION  2011   Family  History:  Family History  Problem Relation Age of Onset  . Hypertension Mother   . Heart disease Mother   . Hypertension Father   . Heart disease Father        Heart attack  . Stroke Father   . Liver cancer Father   . Colon cancer Other        Patient does not know beyond mother (mother was adopted)  . Anesthesia problems Neg Hx   . Hypotension Neg Hx   . Malignant hyperthermia Neg Hx   . Pseudochol deficiency Neg Hx    Family Psychiatric  History: Denies Social History:  Social History   Substance and Sexual Activity  Alcohol Use No  . Alcohol/week: 0.0 standard drinks     Social History   Substance and Sexual Activity  Drug Use Not Currently  . Frequency: 1.0 times per week  . Types: Marijuana, Oxycodone   Comment: quit 5 months ago    Social History   Socioeconomic History  . Marital status: Divorced    Spouse name: Not on file  . Number of children: 0  . Years of education: Not on file  . Highest education level: Not on file  Occupational History  . Occupation: disabled    Fish farm manager: NOT EMPLOYED  Tobacco Use  . Smoking status: Current Every Day Smoker    Packs/day: 0.50    Years: 10.00    Pack years:  5.00    Types: Cigarettes  . Smokeless tobacco: Never Used  Substance and Sexual Activity  . Alcohol use: No    Alcohol/week: 0.0 standard drinks  . Drug use: Not Currently    Frequency: 1.0 times per week    Types: Marijuana, Oxycodone    Comment: quit 5 months ago  . Sexual activity: Yes    Birth control/protection: Other-see comments, None, Condom, Rhythm    Comment: pull-out  Other Topics Concern  . Not on file  Social History Narrative  . Not on file   Social Determinants of Health   Financial Resource Strain:   . Difficulty of Paying Living Expenses:   Food Insecurity:   . Worried About Charity fundraiser in the Last Year:   . Arboriculturist in the Last Year:   Transportation Needs:   . Film/video editor (Medical):   [name] Lack of  Transportation (Non-Medical):   Physical Activity:   . Days of Exercise per Week:   . Minutes of Exercise per Session:   Stress:   . Feeling of Stress :   Social Connections:   . Frequency of Communication with Friends and Family:   . Frequency of Social Gatherings with Friends and Family:   . Attends Religious Services:   . Active Member of Clubs or Organizations:   . Attends Archivist Meetings:   [name] Marital Status:    Additional Social History:                         Sleep: Good  Appetite:  Good  Current Medications: Current Facility-Administered Medications  Medication Dose Route Frequency Provider Last Rate Last Admin  . acetaminophen (TYLENOL) tablet 650 mg  650 mg Oral Q6H PRN Suella Broad, FNP   650 mg at 10/28/19 [id number]  . carbamazepine (TEGRETOL) chewable tablet 200 mg  200 mg Oral BID Sharma Covert, MD   200 mg at 10/30/19 [id number]  . haloperidol (HALDOL) tablet 10 mg  10 mg Oral Daily Sharma Covert, MD   10 mg at 10/30/19 [id number]   Or  . haloperidol lactate (HALDOL) injection 10 mg  10 mg Intramuscular Daily Sharma Covert, MD      . haloperidol (HALDOL) tablet 15 mg  15 mg Oral QHS Sharma Covert, MD   15 mg at 10/29/19 2113   Or  . haloperidol lactate (HALDOL) injection 15 mg  15 mg Intramuscular QHS Sharma Covert, MD      . ibuprofen (ADVIL) tablet 400 mg  400 mg Oral Q6H PRN Sharma Covert, MD   400 mg at 10/28/19 1730  . levothyroxine (SYNTHROID) tablet 50 mcg  50 mcg Oral Q0600 Sharma Covert, MD   50 mcg at 10/30/19 [phone]  . LORazepam (ATIVAN) tablet 1 mg  1 mg Oral Q6H PRN Connye Burkitt, NP   1 mg at 10/26/19 0130   Or  . LORazepam (ATIVAN) injection 1 mg  1 mg Intramuscular Q6H PRN Connye Burkitt, NP      . magnesium hydroxide (MILK OF MAGNESIA) suspension 30 mL  30 mL Oral Daily PRN Burt Ek Gayland Curry, FNP      . pantoprazole (PROTONIX) EC tablet 40 mg  40 mg Oral Daily Sharma Covert, MD   40 mg  at 10/30/19 [id number]  . sulfamethoxazole-trimethoprim (BACTRIM DS) 800-160 MG per tablet 1 tablet  1 tablet Oral Q12H Money, Darnelle Maffucci  B, FNP   1 tablet at 10/30/19 0822  . traZODone (DESYREL) tablet 150 mg  150 mg Oral QHS PRN Sharma Covert, MD   150 mg at 10/28/19 2058  . valACYclovir (VALTREX) tablet 500 mg  500 mg Oral Daily Sharma Covert, MD   500 mg at 10/30/19 [id number]    Lab Results: No results found for this or any previous visit (from the past 48 hour(s)).  Blood Alcohol level:  Lab Results  Component Value Date   ETH <10 123456    Metabolic Disorder Labs: Lab Results  Component Value Date   HGBA1C 5.8 (H) 10/20/2019   MPG 119.76 10/20/2019   No results found for: PROLACTIN Lab Results  Component Value Date   CHOL 164 10/20/2019   TRIG 60 10/20/2019   HDL 59 10/20/2019   CHOLHDL 2.8 10/20/2019   VLDL 12 10/20/2019   LDLCALC 93 10/20/2019   LDLCALC 124 (H) 10/08/2016    Physical Findings: AIMS: Facial and Oral Movements Muscles of Facial Expression: None, normal Lips and Perioral Area: None, normal Jaw: None, normal Tongue: None, normal,Extremity Movements Upper (arms, wrists, hands, fingers): None, normal Lower (legs, knees, ankles, toes): None, normal, Trunk Movements Neck, shoulders, hips: None, normal, Overall Severity Severity of abnormal movements (highest score from questions above): None, normal Incapacitation due to abnormal movements: None, normal Patient's awareness of abnormal movements (rate only patient's report): No Awareness, Dental Status Current problems with teeth and/or dentures?: No Does patient usually wear dentures?: No  CIWA:    COWS:     Musculoskeletal: Strength & Muscle Tone: within normal limits Gait & Station: Uses walker for stability Patient leans: N/A  Psychiatric Specialty Exam: Physical Exam  Nursing note and vitals reviewed. Constitutional: She is oriented to person, place, and time.  Cardiovascular: Normal rate.   Respiratory: Effort normal.  Neurological: She is oriented to person, place, and time.    Review of Systems  Psychiatric/Behavioral: Negative for confusion. The patient is nervous/anxious.   All other systems reviewed and are negative.   Blood pressure 121/74, pulse 72, temperature 97.8 F (36.6 C), temperature source Oral, resp. rate 18, height 5\' 4"  (1.626 m), weight 56.7 kg, SpO2 100 %.Body mass index is 21.46 kg/m.  General Appearance: Casual  Eye Contact:  Good  Speech:  Clear and Coherent and Normal Rate  Volume:  Normal  Mood:  Euthymic  Affect:  Congruent  Thought Process:  Coherent and Descriptions of Associations: Intact  Orientation:  Full (Time, Place, and Person)  Thought Content:  WDL  Suicidal Thoughts:  No  Homicidal Thoughts:  No  Memory:  Immediate;   Fair Recent;   Fair Remote;   Fair  Judgement:  Fair  Insight:  Fair  Psychomotor Activity:  Normal  Concentration:  Concentration: Fair  Recall:  [employer] of Knowledge:  Fair  Language:  Fair  Akathisia:  No  Handed:  Right  AIMS (if indicated):     Assets:  Desire for Improvement Financial Resources/Insurance Housing Social Support  ADL's:  Intact  Cognition:  WNL  Sleep:  Number of Hours: 7.5    Treatment Plan Summary: Daily contact with patient to assess and evaluate symptoms and progress in treatment and Medication management   Continue with current treatment plan on 10/30/2019 as listed below except were noted  Continue Tegretol 200 mg p.o. twice daily for mood stability Continue Haldol 10 mg p.o. daily and 50 mg p.o. nightly for psychosis Continue Synthroid 50  mcg p.o. daily for hypothyroidism Continue Protonix 40 mg p.o. daily for GERD Continue Bactrim for 3 more doses and then discontinue Continue trazodone 150 mg p.o. nightly as needed for insomnia Continue Valtrex 500 mg p.o. daily  Continue every 15 minute safety checks Encourage group therapy participation  Derrill [place],  NP 10/30/2019, 10:45 AM
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
## 2019-10-30 NOTE — Progress Notes (Signed)
Recreation Therapy Notes  Date: 5.11.21 Time: 10:00 Location: 500 Hall Dayroom   Group Topic: Leisure Education  Goal Area(s) Addresses:  Patient will identify positive leisure activities.  Patient will identify one positive benefit of participation in leisure activities.   Behavioral Response: Engaged  Intervention: Leisure [employer], Music  Activity: Keep It Chartered certified accountant.  Patients were seated in a circle.  Patients were to keep the ball in motion for as long as possible.  LRT would keep count of how many times the ball was hit within that time frame.  The ball could be bounced or rolled to the next person.  If the ball came to a complete stop, the count would start over.   Education:  Leisure Education, Dentist  Education Outcome: Acknowledges education/In group clarification offered/Needs additional education  Clinical Observations/Feedback: Pt was engaged with the activity.  Pt seemed to struggle with locating and being able to hit the ball when it came in her vicinity.  Pt was pleasant throughout group session.  Pt stated that it was a good group to get them moving around.    Victorino Sparrow, LRT/CTRS     Ria Comment, Miaya Lafontant A 10/30/2019 11:03 AM

## 2019-10-30 NOTE — BHH Counselor (Signed)
CSW received a message from from [place] stating that patient has a court date on 05/13. CSW does not have an ROI to return call. The message was passed on to patient.  Stephanie Acre, MSW, LCSW-A Clinical Social Worker [facility] Adult Unit

## 2019-10-30 NOTE — BHH Suicide Risk Assessment (Signed)
[place] INPATIENT:  Family/Significant Other Suicide Prevention Education  Suicide Prevention Education:  Contact Attempts: mother, Felicie Morn [phone]) has been identified by the patient as the family member/significant other with whom the patient will be residing, and identified as the person(s) who will aid the patient in the event of a mental health crisis.  With written consent from the patient, two attempts were made to provide suicide prevention education, prior to and/or following the patient's discharge.  We were unsuccessful in providing suicide prevention education.  A suicide education pamphlet was given to the patient to share with family/significant other.  Date and time of first attempt: 10/30/2019 at 2:43pm. CSW left a detailed message with callback information. Date and time of second attempt: needs attempt  Joellen Jersey 10/30/2019, 2:45 PM

## 2019-10-30 NOTE — Progress Notes (Signed)
   10/30/19 1157  Psych Admission Type (Psych Patients Only)  Admission Status Voluntary  Psychosocial Assessment  Patient Complaints Sadness;Worrying  Eye Contact Fair  Facial Expression Anxious  Affect Appropriate to circumstance  Speech Logical/coherent  Interaction Minimal  Motor Activity Unsteady  Appearance/Hygiene Unremarkable  Behavior Characteristics Cooperative  Mood Pleasant  Thought Process  Coherency Circumstantial  Content Religiosity  Delusions Paranoid  Perception WDL  Hallucination None reported or observed  Judgment Poor  Confusion Mild  Danger to Self  Current suicidal ideation? Denies  Danger to Others  Danger to Others None reported or observed   Pt visible in hall on initial contact. Denies SI, HI, AVH and pain when assessed. Per pt "I'm just worried about the medications, sometimes it seems like I'm taking more pills but I'm ok". Presents fidgety on interactions, remains verbally redirectable and medication compliant without side effects thus far. Pt attended scheduled groups and was engaged in activities.  Emotional support offered to pt throughout this shift as needed. Verbal education provided on scheduled medications during administration and effects monitored. Encouraged pt to voice concerns. Q 15 minutes checks maintained for safety and fall without events to note at this time. Pt tolerates all PO intake well. Cooperative with care on and off unit. Ambulatory with walker for assistance. Gait is steady.

## 2019-10-30 NOTE — Progress Notes (Signed)
   10/30/19 2338  Psych Admission Type (Psych Patients Only)  Admission Status Voluntary  Psychosocial Assessment  Patient Complaints Worrying  Eye Contact Fair  Facial Expression Anxious  Affect Appropriate to circumstance  Speech Logical/coherent  Interaction Minimal  Motor Activity Unsteady  Appearance/Hygiene Unremarkable  Behavior Characteristics Cooperative;Anxious  Mood Anxious;Pleasant  Thought Process  Coherency Circumstantial  Content Religiosity  Delusions Paranoid  Perception WDL  Hallucination None reported or observed  Judgment Poor  Confusion Mild  Danger to Self  Current suicidal ideation? Denies  Danger to Others  Danger to Others None reported or observed

## 2019-10-31 MED ORDER — DOCUSATE SODIUM 100 MG PO CAPS
100.0000 mg | ORAL_CAPSULE | Freq: Two times a day (BID) | ORAL | Status: DC
  Administered 2019-10-31 – 2019-11-07 (×14): 100 mg via ORAL
  Filled 2019-10-31 (×20): qty 1

## 2019-10-31 MED ORDER — HALOPERIDOL LACTATE 5 MG/ML IJ SOLN
10.0000 mg | Freq: Two times a day (BID) | INTRAMUSCULAR | Status: DC
  Filled 2019-10-31 (×16): qty 2

## 2019-10-31 MED ORDER — HALOPERIDOL 5 MG PO TABS
10.0000 mg | ORAL_TABLET | Freq: Two times a day (BID) | ORAL | Status: DC
  Administered 2019-10-31 – 2019-11-06 (×12): 10 mg via ORAL
  Filled 2019-10-31 (×17): qty 2

## 2019-10-31 MED ORDER — TRAZODONE HCL 50 MG PO TABS
50.0000 mg | ORAL_TABLET | Freq: Every evening | ORAL | Status: DC | PRN
  Administered 2019-11-02 – 2019-11-04 (×3): 50 mg via ORAL
  Filled 2019-10-31 (×4): qty 1

## 2019-10-31 NOTE — Plan of Care (Signed)
Progress note  D: pt found in bed; compliant with medication administration. Pt denies any physical problems or complaints. Pt continues to use a walker and seems unsteady at times. Pt's thought process has progressed since last week and the pt seems more pleasant/open to their treatment plan. Pt denies si/hi/ah/vh and verbally agrees to approach staff if these become apparent or before harming themself/others while at [facility].  A: Pt provided support and encouragement. Pt given medication per protocol and standing orders. Q49m safety checks implemented and continued.  R: Pt safe on the unit. Will continue to monitor.  Pt progressing in the following metrics  Problem: Coping: Goal: Coping ability will improve Outcome: Progressing Goal: Will verbalize feelings Outcome: Progressing   Problem: Health Behavior/Discharge Planning: Goal: Ability to make decisions will improve Outcome: Progressing Goal: Compliance with therapeutic regimen will improve Outcome: Progressing

## 2019-10-31 NOTE — Progress Notes (Signed)
Recreation Therapy Notes  Date: 5.12.21 Time: 1000 Location: 500 Hall  Group Topic: Communication, Team Building, Problem Solving  Goal Area(s) Addresses:  Patient will effectively work with peer towards shared goal.  Patient will identify skill used to make activity successful.  Patient will identify how skills used during activity can be used to reach post d/c goals.   Intervention: Hydrologist  Activity: Sharks in [employer].  Each person was given a rubber disc plus one extra disc for the group.  As a group, patients were to use the rubber discs to [facility] the group from one end of the hall to the other and back.  If anyone stepped off the disc, the group start over.  Education: Education officer, community, Dentist.   Education Outcome: Acknowledges education/In group clarification offered/Needs additional education.   Clinical Observations/Feedback: Pt did not attend group.     Victorino Sparrow, LRT/CTRS         Victorino Sparrow A 10/31/2019 11:28 AM

## 2019-10-31 NOTE — BHH Counselor (Signed)
CSW received a call from patient's mother, Felicie Morn [phone]). Mother reports that unfortunately, patient's eviction was finalized and mother was informed that someone needs to collect patient's personal items this evening. Apartment management can let mother into the apartment.  CSW will inform patient and ask patient to call her mother back. Patient granted CSW permission to call her mother, but has not authorized for mother to be able to call and speak with patient.  Stephanie Acre, MSW, LCSW-A Clinical Social Worker [facility] Adult Unit

## 2019-10-31 NOTE — BHH Counselor (Signed)
CSW facilitated a meeting with [place] Supervisor, Patriciaann Clan and patient.  At this time, APS recommends placement for patient in a family care home or assisted living facility. APS states that shelter placement would not be a safe or appropriate discharge plan. APS is comfortable moving forward with an interm guardianship petition. APS will contact patient's mother this afternoon.  [facility] APS requested CSW complete and fax over a cosigned FL-2 to assist in placement.  Patient is agreeable.  Stephanie Acre, MSW, LCSW-A Clinical Social Worker [facility] Adult Unit
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
## 2019-10-31 NOTE — Progress Notes (Signed)
   10/31/19 2040  Psych Admission Type (Psych Patients Only)  Admission Status Voluntary  Psychosocial Assessment  Patient Complaints Anxiety  Eye Contact Fair  Facial Expression Animated;Anxious  Affect Anxious;Appropriate to circumstance  Speech Logical/coherent  Interaction Assertive  Motor Activity Slow;Unsteady  Appearance/Hygiene Unremarkable  Behavior Characteristics Cooperative;Anxious  Mood Anxious;Pleasant  Thought Process  Coherency Circumstantial  Content Religiosity;Paranoia  Delusions Religious  Perception WDL  Hallucination None reported or observed  Judgment WDL  Confusion None  Danger to Self  Current suicidal ideation? Denies  Danger to Others  Danger to Others None reported or observed

## 2019-10-31 NOTE — BHH Counselor (Signed)
Patriciaann Clan of [facility] APS plans to meet with patient this morning at approximately 11am.  Stephanie Acre, MSW, LCSW-A Clinical Social Worker [facility] Adult Unit

## 2019-10-31 NOTE — BHH Group Notes (Signed)
LCSW Group Note  10/31/2019   Participation Level:  Construction in the dayroom prevented CSW from facilitating group. There is not an alterative group space on the unit.    Joellen Jersey, [place]  10/31/2019 2:57 PM

## 2019-10-31 NOTE — NC FL2 (Signed)
[place] LEVEL OF CARE SCREENING TOOL     IDENTIFICATION  Patient Name: Mackenzie Williams Birthdate: 04-06-1971 Sex: female Admission Date (Current Location): 10/19/2019  [place] and [place] Number:  Kathleen Argue [id number] [place] and Address:  [facility] [place], [place] [address], [place], [place] [address]      Provider Number: [id number]  Attending Physician Name and Address:  Hampton Abbot, MD  Relative Name and Phone Number:  Mother, Felicie Morn [phone])    Current Level of Care: Hospital Recommended Level of Care: [place] Prior Approval Number:    Date Approved/Denied:   PASRR Number: [id number]  Discharge Plan: Other (Comment)(Family Care Home or ALF)    Current Diagnoses: Patient Active Problem List   Diagnosis Date Noted  . Psychosis ([place]) 10/19/2019  . Constipation 10/05/2016  . Genital herpes 10/05/2016  . Pelvic pain in female 03/25/2015  . Smoker 03/25/2015  . Irregular menses 03/25/2015  . Chronic diarrhea 12/18/2014  . Melena 12/18/2014  . Abdominal pain, lower 12/18/2014  . BV (bacterial vaginosis) 06/11/2014  . History of herpes genitalis 06/11/2014  . Hearing loss 08/29/2013  . 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 Neck pain on right side 02/12/2013  . Fatigue 02/14/2012  . IBS (irritable bowel syndrome) 01/11/2012  . Nausea 11/22/2011  . Upper abdominal pain 11/22/2011  . H. pylori infection 11/22/2011  . Rosanna Randy syndrome 11/22/2011    Orientation RESPIRATION BLADDER Height & Weight     Self, Time, Situation, Place  Normal Continent Weight: 56.7 kg Height:  5\' 4"  (162.6 cm)  BEHAVIORAL SYMPTOMS/MOOD NEUROLOGICAL BOWEL NUTRITION STATUS      Continent Diet(Regular)  AMBULATORY STATUS COMMUNICATION OF NEEDS Skin   Independent(Uses walker.) Verbally Normal                       Personal Care Assistance Level of Assistance  Bathing,  Dressing, Feeding, Total care Bathing Assistance: Independent Feeding assistance: Independent   Total Care Assistance: Independent   Functional Limitations Info             SPECIAL CARE FACTORS FREQUENCY                       Contractures Contractures Info: Not present    Additional Factors Info  Code Status, Allergies Code Status Info: FULL Allergies Info: Codeine           Current Medications (10/31/2019):  This is the current hospital active medication list Current Facility-Administered Medications  Medication Dose Route Frequency Provider Last Rate Last Admin  . acetaminophen (TYLENOL) tablet 650 mg  650 mg Oral Q6H PRN Suella Broad, FNP   650 mg at 10/28/19 [id number]  . carbamazepine (TEGRETOL) chewable tablet 200 mg  200 mg Oral BID Sharma Covert, MD   200 mg at 10/31/19 [id number]  . haloperidol (HALDOL) tablet 10 mg  10 mg Oral Daily Sharma Covert, MD   10 mg at 10/31/19 [id number]   Or  . haloperidol lactate (HALDOL) injection 10 mg  10 mg Intramuscular Daily Sharma Covert, MD      . haloperidol (HALDOL) tablet 15 mg  15 mg Oral QHS Sharma Covert, MD   15 mg at 10/30/19 2035   Or  . haloperidol lactate (HALDOL) injection 15 mg  15 mg Intramuscular QHS Sharma Covert, MD      . ibuprofen (ADVIL) tablet 400  mg  400 mg Oral Q6H PRN Sharma Covert, MD   400 mg at 10/28/19 1730  . levothyroxine (SYNTHROID) tablet 50 mcg  50 mcg Oral Q0600 Sharma Covert, MD   50 mcg at 10/31/19 1118  . LORazepam (ATIVAN) tablet 1 mg  1 mg Oral Q6H PRN Connye Burkitt, NP   1 mg at 10/26/19 0130   Or  . LORazepam (ATIVAN) injection 1 mg  1 mg Intramuscular Q6H PRN Connye Burkitt, NP      . magnesium hydroxide (MILK OF MAGNESIA) suspension 30 mL  30 mL Oral Daily PRN Burt Ek Gayland Curry, FNP      . pantoprazole (PROTONIX) EC tablet 40 mg  40 mg Oral Daily Sharma Covert, MD   40 mg at 10/31/19 [id number]  . traZODone (DESYREL) tablet 150 mg  150 mg Oral QHS  PRN Sharma Covert, MD   150 mg at 10/28/19 2058  . valACYclovir (VALTREX) tablet 500 mg  500 mg Oral Daily Sharma Covert, MD   500 mg at 10/31/19 [id number]     Discharge Medications: Please see discharge summary for a list of discharge medications.  Relevant Imaging Results:  Relevant Lab Results:   Additional Information SSN: [id number]  Joellen Jersey, [place]

## 2019-11-01 LAB — COMPREHENSIVE METABOLIC PANEL
ALT: 41 U/L (ref 0–44)
AST: 34 U/L (ref 15–41)
Albumin: 3.6 g/dL (ref 3.5–5.0)
Alkaline Phosphatase: 61 U/L (ref 38–126)
Anion gap: 7 (ref 5–15)
BUN: 30 mg/dL — ABNORMAL HIGH (ref 6–20)
CO2: 28 mmol/L (ref 22–32)
Calcium: 8.9 mg/dL (ref 8.9–10.3)
Chloride: 106 mmol/L (ref 98–111)
Creatinine, Ser: 0.91 mg/dL (ref 0.44–1.00)
GFR calc Af Amer: >60 mL/min (ref >60)
GFR calc non Af Amer: >60 mL/min (ref >60)
Glucose, Bld: 69 mg/dL — ABNORMAL LOW (ref 70–99)
Potassium: 4.7 mmol/L (ref 3.5–5.1)
Sodium: 141 mmol/L (ref 135–145)
Total Bilirubin: 0.7 mg/dL (ref 0.3–1.2)
Total Protein: 6.7 g/dL (ref 6.5–8.1)

## 2019-11-01 NOTE — Progress Notes (Signed)
Recreation Therapy Notes  Date: 5.13.21 Time: 1000 Location: [address]  Group Topic: Coping Skills  Goal Area(s) Addresses:  Patient will identify positive coping skills. Patient will identify benefit of using coping skills post d/c.  Intervention: Game  Activity: Scientist, clinical (histocompatibility and immunogenetics).  Patients would play game according to regular rules.  In addition to identifying a coping skill for each piece they move.  Patients could not repeat any coping skill that had been named.  If the [place] tower fell over, the game would start over.  Education: Radiographer, therapeutic, Dentist.   Education Outcome: Acknowledges understanding/In group clarification offered/Needs additional education.   Clinical Observations/Feedback: Pt did not attend group session.    Victorino Sparrow, LRT/CTRS         Ria Comment, Ovella [place]goats A 11/01/2019 11:22 AM

## 2019-11-01 NOTE — Progress Notes (Signed)
   11/01/19 2130  Psych Admission Type (Psych Patients Only)  Admission Status Voluntary  Psychosocial Assessment  Patient Complaints None  Eye Contact Fair  Facial Expression Anxious  Affect Appropriate to circumstance  Speech Logical/coherent  Interaction Assertive  Motor Activity Slow;Unsteady  Appearance/Hygiene Unremarkable  Behavior Characteristics Cooperative  Mood Pleasant;Anxious  Thought Process  Coherency Circumstantial  Content WDL  Delusions None reported or observed  Perception WDL  Hallucination None reported or observed  Judgment WDL  Confusion None  Danger to Self  Current suicidal ideation? Denies  Danger to Others  Danger to Others None reported or observed

## 2019-11-01 NOTE — BHH Group Notes (Signed)
[facility] Mental Health Association Group Therapy 11/01/2019 2:33 PM  Type of Therapy: Mental Health Association Presentation  Participation Level: Active  Participation Quality: Attentive  Affect: Appropriate  Cognitive: Oriented  Insight: Developing/Improving  Engagement in Therapy: Engaged  Modes of Intervention: Discussion, Education and Socialization   Summary of Progress/Problems: Mackenzie ([place]) Speaker came to talk about his personal journey with mental health. The pt processed ways by which to relate to the speaker. [place] speaker provided handouts and educational information pertaining to groups and services offered by the [facility]. Pt was engaged in speaker's presentation and was receptive to resources provided.   Stephanie Acre, MSW, Operating Room Services 11/01/2019 2:33 PM

## 2019-11-01 NOTE — Progress Notes (Signed)
[facility] MD Progress Note  11/01/2019 11:22 AM Mackenzie Williams  MRN:  [id number]   Subjective: Follow-up for this 49 year old female diagnosed with psychosis.  Patient reports that she is doing really good today.  She states she has no complaints and denies any medication side effects.  She reports that she did meet with APS again yesterday and the meeting went well and she knows that she is just waiting for placement now.  Patient states that she will be compliant with her medications and continue doing everything she needs to do to try to get out of the hospital.  She denies any suicidal or homicidal ideations and denies any hallucinations.  Patient also denies feeling any agitation or anger reports feeling calm and having a good mood.  She reports that she has been sleeping well and has been eating well also.  Principal Problem: Psychosis ([place]) Diagnosis: Principal Problem:   Psychosis ([place])  Total Time spent with patient: 30 minutes  Past Psychiatric History: Denies  Past Medical History:  Past Medical History:  Diagnosis Date  . Asthma   . COPD (chronic obstructive pulmonary disease) ([place])   . Depression   . Esophageal hernia   . Genital herpes   . H. pylori infection 11/2011  . Leg pain, right   . Menorrhagia    [facility]  . MRSA carrier   . Neck pain   . Rosacea   . Seasonal allergies   . Varicose veins     Past Surgical History:  Procedure Laterality Date  . ANKLE SURGERY  1999  . CHOLECYSTECTOMY  04/07/2011   Dr Elenor Quinones dyskinesia  . ESOPHAGOGASTRODUODENOSCOPY  03/02/2012   RMR: small hiatal hernia. Gastric polyp and abnormal gastric mucosa of doubtfull clinical significance- status post biopsy (benign). No explanation for abdominal pain based on today's examination.   [name] [place]  2010  . HIP SURGERY    . UTERINE FIBROID SURGERY  2011  . UTERINE SEPTUM RESECTION  2011   Family History:  Family History  Problem Relation Age of Onset  .  Hypertension Mother   . Heart disease Mother   . Hypertension Father   . Heart disease Father        Heart attack  . Stroke Father   . Liver cancer Father   . Colon cancer Other        Patient does not know beyond mother (mother was adopted)  . Anesthesia problems Neg Hx   . Hypotension Neg Hx   . Malignant hyperthermia Neg Hx   . Pseudochol deficiency Neg Hx    Family Psychiatric  History: Denies Social History:  Social History   Substance and Sexual Activity  Alcohol Use No  . Alcohol/week: 0.0 standard drinks     Social History   Substance and Sexual Activity  Drug Use Not Currently  . Frequency: 1.0 times per week  . Types: Marijuana, Oxycodone   Comment: quit 5 months ago    Social History   Socioeconomic History  . Marital status: Divorced    Spouse name: Not on file  . Number of children: 0  . Years of education: Not on file  . Highest education level: Not on file  Occupational History  . Occupation: disabled    Fish farm manager: NOT EMPLOYED  Tobacco Use  . Smoking status: Current Every Day Smoker    Packs/day: 0.50    Years: 10.00    Pack years: 5.00    Types: Cigarettes  .  Smokeless tobacco: Never Used  Substance and Sexual Activity  . Alcohol use: No    Alcohol/week: 0.0 standard drinks  . Drug use: Not Currently    Frequency: 1.0 times per week    Types: Marijuana, Oxycodone    Comment: quit 5 months ago  . Sexual activity: Yes    Birth control/protection: Other-see comments, None, Condom, Rhythm    Comment: pull-out  Other Topics Concern  . Not on file  Social History Narrative  . Not on file   Social Determinants of Health   Financial Resource Strain:   . Difficulty of Paying Living Expenses:   Food Insecurity:   . Worried About Charity fundraiser in the Last Year:   . Arboriculturist in the Last Year:   Transportation Needs:   . Film/video editor (Medical):   [name] Lack of Transportation (Non-Medical):   Physical Activity:   . Days of  Exercise per Week:   . Minutes of Exercise per Session:   Stress:   . Feeling of Stress :   Social Connections:   . Frequency of Communication with Friends and Family:   . Frequency of Social Gatherings with Friends and Family:   . Attends Religious Services:   . Active Member of Clubs or Organizations:   . Attends Archivist Meetings:   [name] Marital Status:    Additional Social History:                         Sleep: Good  Appetite:  Good  Current Medications: Current Facility-Administered Medications  Medication Dose Route Frequency Provider Last Rate Last Admin  . acetaminophen (TYLENOL) tablet 650 mg  650 mg Oral Q6H PRN Suella Broad, FNP   650 mg at 10/28/19 [id number]  . carbamazepine (TEGRETOL) chewable tablet 200 mg  200 mg Oral BID Sharma Covert, MD   200 mg at 10/31/19 2041  . docusate sodium (COLACE) capsule 100 mg  100 mg Oral BID Maxine Fredman, Lowry Ram, FNP   100 mg at 10/31/19 1628  . haloperidol (HALDOL) tablet 10 mg  10 mg Oral BID Cobos, Myer Peer, MD   10 mg at 10/31/19 1628   Or  . haloperidol lactate (HALDOL) injection 10 mg  10 mg Intramuscular BID Cobos, Fernando A, MD      . ibuprofen (ADVIL) tablet 400 mg  400 mg Oral Q6H PRN Sharma Covert, MD   400 mg at 10/28/19 1730  . levothyroxine (SYNTHROID) tablet 50 mcg  50 mcg Oral Q0600 Sharma Covert, MD   50 mcg at 10/31/19 1118  . LORazepam (ATIVAN) tablet 1 mg  1 mg Oral Q6H PRN Connye Burkitt, NP   1 mg at 10/26/19 0130   Or  . LORazepam (ATIVAN) injection 1 mg  1 mg Intramuscular Q6H PRN Connye Burkitt, NP      . magnesium hydroxide (MILK OF MAGNESIA) suspension 30 mL  30 mL Oral Daily PRN Burt Ek Gayland Curry, FNP      . pantoprazole (PROTONIX) EC tablet 40 mg  40 mg Oral Daily Sharma Covert, MD   40 mg at 10/31/19 [id number]  . traZODone (DESYREL) tablet 50 mg  50 mg Oral QHS PRN Cobos, Myer Peer, MD      . valACYclovir (VALTREX) tablet 500 mg  500 mg Oral Daily Sharma Covert, MD   500 mg at 10/31/19 [id number]    Lab Results:  No results found for this or any previous visit (from the past 48 hour(s)).  Blood Alcohol level:  Lab Results  Component Value Date   ETH <10 123456    Metabolic Disorder Labs: Lab Results  Component Value Date   HGBA1C 5.8 (H) 10/20/2019   MPG 119.76 10/20/2019   No results found for: PROLACTIN Lab Results  Component Value Date   CHOL 164 10/20/2019   TRIG 60 10/20/2019   HDL 59 10/20/2019   CHOLHDL 2.8 10/20/2019   VLDL 12 10/20/2019   LDLCALC 93 10/20/2019   LDLCALC 124 (H) 10/08/2016    Physical Findings: AIMS: Facial and Oral Movements Muscles of Facial Expression: None, normal Lips and Perioral Area: None, normal Jaw: None, normal Tongue: None, normal,Extremity Movements Upper (arms, wrists, hands, fingers): None, normal Lower (legs, knees, ankles, toes): None, normal, Trunk Movements Neck, shoulders, hips: None, normal, Overall Severity Severity of abnormal movements (highest score from questions above): None, normal Incapacitation due to abnormal movements: None, normal Patient's awareness of abnormal movements (rate only patient's report): No Awareness, Dental Status Current problems with teeth and/or dentures?: No Does patient usually wear dentures?: No  CIWA:    COWS:     Musculoskeletal: Strength & Muscle Tone: within normal limits Gait & Station: unsteady, uses walker for ambulation Patient leans: N/A  Psychiatric Specialty Exam: Physical Exam  Review of Systems  Blood pressure 121/74, pulse 72, temperature 97.8 F (36.6 C), temperature source Oral, resp. rate 18, height 5\' 4"  (1.626 m), weight 56.7 kg, SpO2 100 %.Body mass index is 21.46 kg/m.  General Appearance: Casual  Eye Contact:  Good  Speech:  Clear and Coherent and Normal Rate  Volume:  Normal  Mood:  Euthymic  Affect:  Congruent  Thought Process:  Coherent and Descriptions of Associations: Intact  Orientation:  Full (Time,  Place, and Person)  Thought Content:  WDL  Suicidal Thoughts:  No  Homicidal Thoughts:  No  Memory:  Immediate;   Good Recent;   Good Remote;   Fair  Judgement:  Fair  Insight:  Fair  Psychomotor Activity:  Normal  Concentration:  Concentration: Good  Recall:  Good  Fund of Knowledge:  Good  Language:  Good  Akathisia:  No  Handed:  Right  AIMS (if indicated):     Assets:  Communication Skills Desire for Improvement Financial Resources/Insurance Resilience Social Support  ADL's:  Intact  Cognition:  WNL  Sleep:  Number of Hours: 6   Assessment: Patient presents in her room lying in the bed and states that she is disability lazy today but otherwise feels fine.  Patient is pleasant, calm, cooperative.  Patient does show logical thought and explanation of the events that are occurring are waiting for her to discharge.  Patient has been compliant with medications and treatment as well.  There have been no complaints about the patient when she has been on the unit.  Reviewed patient's labs and due to medications patient is on will have a new CMP ordered as well as an EKG due to Tegretol and as well as the antipsychotic.  She has to be working with APS and still awaiting disposition at this time.  Treatment Plan Summary: Daily contact with patient to assess and evaluate symptoms and progress in treatment and Medication management Continue Tegretol 200 mg p.o. twice daily for mood stability Continue Colace 100 mg p.o. twice daily for constipation Continue Haldol 10 mg p.o. twice daily for psychosis Continue Synthroid 50 mcg p.o.  daily for hypothyroidism Continue Protonix 40 mg p.o. daily for GERD Continue trazodone 50 mg p.o. nightly as needed for insomnia Order CMP Order EKG Encourage group therapy participation Continue every 15 minute safety checks CSW working with APS awaiting disposition and expecting placement into ALF  Lewis Shock, FNP 11/01/2019, 11:22 AM

## 2019-11-01 NOTE — Progress Notes (Signed)
Progress note  D: Pt continues to progress and be compliant with medications. Pt's thought process seems to be more congruent with their treatment plan and the pt understands this. Pt verbalizes the need for medications to continue progressing. Pt has been reclusive to their room and sleeping more today. Pt denies si/hi/ah/vh and verbally agrees to approach staff if these become apparent or before harming themself/others while at [place].  A: Pt provided support and encouragement. Pt given medication per protocol and standing orders. Q68m safety checks implemented and continued.  R: Pt safe on the unit. Will continue to monitor.

## 2019-11-01 NOTE — Progress Notes (Signed)
The patient states that what she learned today is to attend her groups and that she wants to learn more. Her goal for tomorrow is to go home but realizes that she is a placement issue.

## 2019-11-02 NOTE — Progress Notes (Signed)
Recreation Therapy Notes  Date: 5.14.21 Time: 1000 Location: [address]  Group Topic: Communication, Team Building, Problem Solving  Goal Area(s) Addresses:  Patient will effectively work with peer towards shared goal.  Patient will identify skill used to make activity successful.  Patient will identify how skills used during activity can be used to reach post d/c goals.   Intervention: STEM Activity   Activity:  In groups, patients were asked to build a freestanding tower as tall as they could that could stand on its own using 12 pipe cleaners.  Along the way, the groups will experience budget cuts in which they will lose the use of one hand and the ability to talk to each other.  Patients will regain the use of these functions when money is added back to the budget.  Education: Education officer, community, Dentist.   Education Outcome: Acknowledges education  Clinical Observations/Feedback: Pt did not attend group session.      Victorino Sparrow, LRT/CTRS     Victorino Sparrow A 11/02/2019 11:51 AM

## 2019-11-02 NOTE — Progress Notes (Signed)
[facility] MD Progress Note  11/02/2019 2:00 PM Mackenzie Williams  MRN:  [id number]   Subjective: Follow-up for this 49 year old female diagnosed with psychosis.  Patient reports that she is feeling good today.  She denies any suicidal or homicidal ideations and denies any hallucinations patient states that she feels safe here and that she is just waiting to hear what the APS is going to do for her.  She reports that she is taking her medication and going to groups when she can.  She states she has no complaints at this time and feels that things are going very well for her at this moment.  Principal Problem: Psychosis ([place]) Diagnosis: Principal Problem:   Psychosis ([place])  Total Time spent with patient: 20 minutes  Past Psychiatric History: Denies  Past Medical History:  Past Medical History:  Diagnosis Date  . Asthma   . COPD (chronic obstructive pulmonary disease) ([place])   . Depression   . Esophageal hernia   . Genital herpes   . H. pylori infection 11/2011  . Leg pain, right   . Menorrhagia    [facility]  . MRSA carrier   . Neck pain   . Rosacea   . Seasonal allergies   . Varicose veins     Past Surgical History:  Procedure Laterality Date  . ANKLE SURGERY  1999  . CHOLECYSTECTOMY  04/07/2011   Dr Elenor Quinones dyskinesia  . ESOPHAGOGASTRODUODENOSCOPY  03/02/2012   RMR: small hiatal hernia. Gastric polyp and abnormal gastric mucosa of doubtfull clinical significance- status post biopsy (benign). No explanation for abdominal pain based on today's examination.   [name] [place]  2010  . HIP SURGERY    . UTERINE FIBROID SURGERY  2011  . UTERINE SEPTUM RESECTION  2011   Family History:  Family History  Problem Relation Age of Onset  . Hypertension Mother   . Heart disease Mother   . Hypertension Father   . Heart disease Father        Heart attack  . Stroke Father   . Liver cancer Father   . Colon cancer Other        Patient does not know beyond mother  (mother was adopted)  . Anesthesia problems Neg Hx   . Hypotension Neg Hx   . Malignant hyperthermia Neg Hx   . Pseudochol deficiency Neg Hx    Family Psychiatric  History: Denies Social History:  Social History   Substance and Sexual Activity  Alcohol Use No  . Alcohol/week: 0.0 standard drinks     Social History   Substance and Sexual Activity  Drug Use Not Currently  . Frequency: 1.0 times per week  . Types: Marijuana, Oxycodone   Comment: quit 5 months ago    Social History   Socioeconomic History  . Marital status: Divorced    Spouse name: Not on file  . Number of children: 0  . Years of education: Not on file  . Highest education level: Not on file  Occupational History  . Occupation: disabled    Fish farm manager: NOT EMPLOYED  Tobacco Use  . Smoking status: Current Every Day Smoker    Packs/day: 0.50    Years: 10.00    Pack years: 5.00    Types: Cigarettes  . Smokeless tobacco: Never Used  Substance and Sexual Activity  . Alcohol use: No    Alcohol/week: 0.0 standard drinks  . Drug use: Not Currently    Frequency: 1.0 times per week  Types: Marijuana, Oxycodone    Comment: quit 5 months ago  . Sexual activity: Yes    Birth control/protection: Other-see comments, None, Condom, Rhythm    Comment: pull-out  Other Topics Concern  . Not on file  Social History Narrative  . Not on file   Social Determinants of Health   Financial Resource Strain:   . Difficulty of Paying Living Expenses:   Food Insecurity:   . Worried About Charity fundraiser in the Last Year:   . Arboriculturist in the Last Year:   Transportation Needs:   . Film/video editor (Medical):   [name] Lack of Transportation (Non-Medical):   Physical Activity:   . Days of Exercise per Week:   . Minutes of Exercise per Session:   Stress:   . Feeling of Stress :   Social Connections:   . Frequency of Communication with Friends and Family:   . Frequency of Social Gatherings with Friends and  Family:   . Attends Religious Services:   . Active Member of Clubs or Organizations:   . Attends Archivist Meetings:   [name] Marital Status:    Additional Social History:                         Sleep: Good  Appetite:  Good  Current Medications: Current Facility-Administered Medications  Medication Dose Route Frequency Provider Last Rate Last Admin  . acetaminophen (TYLENOL) tablet 650 mg  650 mg Oral Q6H PRN Suella Broad, FNP   650 mg at 10/28/19 [id number]  . carbamazepine (TEGRETOL) chewable tablet 200 mg  200 mg Oral BID Sharma Covert, MD   200 mg at 11/02/19 1110  . docusate sodium (COLACE) capsule 100 mg  100 mg Oral BID Jerrelle Michelsen, Lowry Ram, FNP   100 mg at 11/02/19 1110  . haloperidol (HALDOL) tablet 10 mg  10 mg Oral BID Cobos, Myer Peer, MD   10 mg at 11/02/19 1110   Or  . haloperidol lactate (HALDOL) injection 10 mg  10 mg Intramuscular BID Cobos, Fernando A, MD      . ibuprofen (ADVIL) tablet 400 mg  400 mg Oral Q6H PRN Sharma Covert, MD   400 mg at 10/28/19 1730  . levothyroxine (SYNTHROID) tablet 50 mcg  50 mcg Oral Q0600 Sharma Covert, MD   50 mcg at 11/02/19 1109  . LORazepam (ATIVAN) tablet 1 mg  1 mg Oral Q6H PRN Connye Burkitt, NP   1 mg at 10/26/19 0130   Or  . LORazepam (ATIVAN) injection 1 mg  1 mg Intramuscular Q6H PRN Connye Burkitt, NP      . magnesium hydroxide (MILK OF MAGNESIA) suspension 30 mL  30 mL Oral Daily PRN Burt Ek Gayland Curry, FNP      . pantoprazole (PROTONIX) EC tablet 40 mg  40 mg Oral Daily Sharma Covert, MD   40 mg at 11/02/19 1110  . traZODone (DESYREL) tablet 50 mg  50 mg Oral QHS PRN Cobos, Myer Peer, MD      . valACYclovir (VALTREX) tablet 500 mg  500 mg Oral Daily Sharma Covert, MD   500 mg at 11/02/19 1110    Lab Results:  Results for orders placed or performed during the hospital encounter of 10/19/19 (from the past 48 hour(s))  Comprehensive metabolic panel     Status: Abnormal    Collection Time: 11/01/19  6:26 PM  Result  Value Ref Range   Sodium 141 135 - 145 mmol/L   Potassium 4.7 3.5 - 5.1 mmol/L   Chloride 106 98 - 111 mmol/L   CO2 28 22 - 32 mmol/L   Glucose, Bld 69 (L) 70 - 99 mg/dL    Comment: Glucose reference range applies only to samples taken after fasting for at least 8 hours.   BUN 30 (H) 6 - 20 mg/dL   Creatinine, Ser 0.91 0.44 - 1.00 mg/dL   Calcium 8.9 8.9 - 10.3 mg/dL   Total Protein 6.7 6.5 - 8.1 g/dL   Albumin 3.6 3.5 - 5.0 g/dL   AST 34 15 - 41 U/L   ALT 41 0 - 44 U/L   Alkaline Phosphatase 61 38 - 126 U/L   Total Bilirubin 0.7 0.3 - 1.2 mg/dL   GFR calc non Af Amer >60 >60 mL/min   GFR calc Af Amer >60 >60 mL/min   Anion gap 7 5 - 15    Comment: Performed at [facility], [place] [address], [place], [place] [address]    Blood Alcohol level:  Lab Results  Component Value Date   ETH <10 123456    Metabolic Disorder Labs: Lab Results  Component Value Date   HGBA1C 5.8 (H) 10/20/2019   MPG 119.76 10/20/2019   No results found for: PROLACTIN Lab Results  Component Value Date   CHOL 164 10/20/2019   TRIG 60 10/20/2019   HDL 59 10/20/2019   CHOLHDL 2.8 10/20/2019   VLDL 12 10/20/2019   LDLCALC 93 10/20/2019   LDLCALC 124 (H) 10/08/2016    Physical Findings: AIMS: Facial and Oral Movements Muscles of Facial Expression: None, normal Lips and Perioral Area: None, normal Jaw: None, normal Tongue: None, normal,Extremity Movements Upper (arms, wrists, hands, fingers): None, normal Lower (legs, knees, ankles, toes): None, normal, Trunk Movements Neck, shoulders, hips: None, normal, Overall Severity Severity of abnormal movements (highest score from questions above): None, normal Incapacitation due to abnormal movements: None, normal Patient's awareness of abnormal movements (rate only patient's report): No Awareness, Dental Status Current problems with teeth and/or dentures?: No Does patient usually wear  dentures?: No  CIWA:    COWS:     Musculoskeletal: Strength & Muscle Tone: within normal limits Gait & Station: unsteady, uses walker for ambulation Patient leans: N/A  Psychiatric Specialty Exam: Physical Exam  Nursing note and vitals reviewed. Constitutional: She is oriented to person, place, and time. She appears well-developed and well-nourished.  Cardiovascular: Normal rate.  Respiratory: Effort normal.  Musculoskeletal:        General: Normal range of motion.  Neurological: She is alert and oriented to person, place, and time.  Skin: Skin is warm.    Review of Systems  Constitutional: Negative.   HENT: Negative.   Eyes: Negative.   Respiratory: Negative.   Cardiovascular: Negative.   Gastrointestinal: Negative.   Genitourinary: Negative.   Musculoskeletal: Negative.   Skin: Negative.   Neurological: Negative.   Psychiatric/Behavioral: Negative.     Blood pressure 121/74, pulse 72, temperature 97.8 F (36.6 C), temperature source Oral, resp. rate 18, height 5\' 4"  (1.626 m), weight 56.7 kg, SpO2 100 %.Body mass index is 21.46 kg/m.  General Appearance: Casual  Eye Contact:  Good  Speech:  Clear and Coherent and Normal Rate  Volume:  Normal  Mood:  Euthymic  Affect:  Congruent  Thought Process:  Coherent and Descriptions of Associations: Intact  Orientation:  Full (Time, Place, and Person)  Thought Content:  WDL  Suicidal Thoughts:  No  Homicidal Thoughts:  No  Memory:  Immediate;   Good Recent;   Good Remote;   Fair  Judgement:  Fair  Insight:  Fair  Psychomotor Activity:  Normal  Concentration:  Concentration: Good  Recall:  Good  Fund of Knowledge:  Good  Language:  Good  Akathisia:  No  Handed:  Right  AIMS (if indicated):     Assets:  Communication Skills Desire for Improvement Financial Resources/Insurance Resilience Social Support  ADL's:  Intact  Cognition:  WNL  Sleep:  Number of Hours: 6.75   Assessment: Patient presents in her room  fixing her bed.  Patient presents pretty much the same as she did yesterday she continues to remain stable and has no complaints today.  She continues to understand avoiding for APS and placement.  Has been compliant with her medications and has not shown any type of paranoia, agitation, aggression, or any symptoms of psychosis.  Patient is pleasant, calm, and cooperative and has been compliant with treatment and medications and has been attending groups.  After reviewing patient's chart found that her BUN has shown an increase from 16 to 30 we will continue to monitor.  Treatment Plan Summary: Daily contact with patient to assess and evaluate symptoms and progress in treatment and Medication management Continue Tegretol 200 mg p.o. twice daily for mood stability Continue Colace 100 mg p.o. twice daily for constipation Continue Haldol 10 mg p.o. twice daily for psychosis Continue Synthroid 50 mcg p.o. daily for hypothyroidism Continue Protonix 40 mg p.o. daily for GERD Continue trazodone 50 mg p.o. nightly as needed for insomnia Encourage group therapy participation Continue every 15 minute safety checks CSW working with APS awaiting disposition and expecting placement into ALF  Lewis Shock, FNP 11/02/2019, 2:00 PM

## 2019-11-02 NOTE — Progress Notes (Signed)
   11/02/19 2140  Psych Admission Type (Psych Patients Only)  Admission Status Voluntary  Psychosocial Assessment  Patient Complaints None  Eye Contact Fair  Facial Expression Animated  Affect Appropriate to circumstance  Speech Logical/coherent  Interaction Assertive  Motor Activity Slow;Unsteady  Appearance/Hygiene Unremarkable  Behavior Characteristics Appropriate to situation  Mood Pleasant  Thought Process  Coherency WDL  Content WDL  Delusions None reported or observed  Perception WDL  Hallucination None reported or observed  Judgment WDL  Confusion None  Danger to Self  Current suicidal ideation? Denies  Danger to Others  Danger to Others None reported or observed

## 2019-11-02 NOTE — Progress Notes (Signed)
   11/02/19 2140  COVID-19 Daily Checkoff  Have you had a fever (temp > 37.80C/100F)  in the past 24 hours?  No  COVID-19 EXPOSURE  Have you traveled outside the state in the past 14 days? No  Have you been in contact with someone with a confirmed diagnosis of COVID-19 or PUI in the past 14 days without wearing appropriate PPE? No  Have you been living in the same home as a person with confirmed diagnosis of COVID-19 or a PUI (household contact)? No  Have you been diagnosed with COVID-19? No

## 2019-11-03 NOTE — Progress Notes (Signed)
[facility] MD Progress Note  11/03/2019 2:22 PM Mackenzie Williams  MRN:  [id number]   Subjective: Follow-up for this 49 year old female diagnosed with psychosis.  Patient states that everything is going fine today.  She reports sleeping well as well as still having a good appetite.  She reports that she is not having any side effects from the medications and denies any suicidal or homicidal ideations and denies any hallucinations.  Patient reports that she has no additional complaints today and is just waiting for her to discharge to her new housing arrangements.  Principal Problem: Psychosis ([place]) Diagnosis: Principal Problem:   Psychosis ([place])  Total Time spent with patient: 20 minutes  Past Psychiatric History: Denies  Past Medical History:  Past Medical History:  Diagnosis Date  . Asthma   . COPD (chronic obstructive pulmonary disease) ([place])   . Depression   . Esophageal hernia   . Genital herpes   . H. pylori infection 11/2011  . Leg pain, right   . Menorrhagia    [facility]  . MRSA carrier   . Neck pain   . Rosacea   . Seasonal allergies   . Varicose veins     Past Surgical History:  Procedure Laterality Date  . ANKLE SURGERY  1999  . CHOLECYSTECTOMY  04/07/2011   Dr Elenor Quinones dyskinesia  . ESOPHAGOGASTRODUODENOSCOPY  03/02/2012   RMR: small hiatal hernia. Gastric polyp and abnormal gastric mucosa of doubtfull clinical significance- status post biopsy (benign). No explanation for abdominal pain based on today's examination.   [name] [place]  2010  . HIP SURGERY    . UTERINE FIBROID SURGERY  2011  . UTERINE SEPTUM RESECTION  2011   Family History:  Family History  Problem Relation Age of Onset  . Hypertension Mother   . Heart disease Mother   . Hypertension Father   . Heart disease Father        Heart attack  . Stroke Father   . Liver cancer Father   . Colon cancer Other        Patient does not know beyond mother (mother was adopted)  .  Anesthesia problems Neg Hx   . Hypotension Neg Hx   . Malignant hyperthermia Neg Hx   . Pseudochol deficiency Neg Hx    Family Psychiatric  History: Denies Social History:  Social History   Substance and Sexual Activity  Alcohol Use No  . Alcohol/week: 0.0 standard drinks     Social History   Substance and Sexual Activity  Drug Use Not Currently  . Frequency: 1.0 times per week  . Types: Marijuana, Oxycodone   Comment: quit 5 months ago    Social History   Socioeconomic History  . Marital status: Divorced    Spouse name: Not on file  . Number of children: 0  . Years of education: Not on file  . Highest education level: Not on file  Occupational History  . Occupation: disabled    Fish farm manager: NOT EMPLOYED  Tobacco Use  . Smoking status: Current Every Day Smoker    Packs/day: 0.50    Years: 10.00    Pack years: 5.00    Types: Cigarettes  . Smokeless tobacco: Never Used  Substance and Sexual Activity  . Alcohol use: No    Alcohol/week: 0.0 standard drinks  . Drug use: Not Currently    Frequency: 1.0 times per week    Types: Marijuana, Oxycodone    Comment: quit 5 months ago  .  Sexual activity: Yes    Birth control/protection: Other-see comments, None, Condom, Rhythm    Comment: pull-out  Other Topics Concern  . Not on file  Social History Narrative  . Not on file   Social Determinants of Health   Financial Resource Strain:   . Difficulty of Paying Living Expenses:   Food Insecurity:   . Worried About Charity fundraiser in the Last Year:   . Arboriculturist in the Last Year:   Transportation Needs:   . Film/video editor (Medical):   [name] Lack of Transportation (Non-Medical):   Physical Activity:   . Days of Exercise per Week:   . Minutes of Exercise per Session:   Stress:   . Feeling of Stress :   Social Connections:   . Frequency of Communication with Friends and Family:   . Frequency of Social Gatherings with Friends and Family:   . Attends  Religious Services:   . Active Member of Clubs or Organizations:   . Attends Archivist Meetings:   [name] Marital Status:    Additional Social History:                         Sleep: Good  Appetite:  Good  Current Medications: Current Facility-Administered Medications  Medication Dose Route Frequency Provider Last Rate Last Admin  . acetaminophen (TYLENOL) tablet 650 mg  650 mg Oral Q6H PRN Suella Broad, FNP   650 mg at 10/28/19 [id number]  . carbamazepine (TEGRETOL) chewable tablet 200 mg  200 mg Oral BID Sharma Covert, MD   200 mg at 11/03/19 0810  . docusate sodium (COLACE) capsule 100 mg  100 mg Oral BID Tywone Bembenek, Lowry Ram, FNP   100 mg at 11/03/19 0810  . haloperidol (HALDOL) tablet 10 mg  10 mg Oral BID Cobos, Myer Peer, MD   10 mg at 11/03/19 0810   Or  . haloperidol lactate (HALDOL) injection 10 mg  10 mg Intramuscular BID Cobos, Fernando A, MD      . ibuprofen (ADVIL) tablet 400 mg  400 mg Oral Q6H PRN Sharma Covert, MD   400 mg at 10/28/19 1730  . levothyroxine (SYNTHROID) tablet 50 mcg  50 mcg Oral Q0600 Sharma Covert, MD   50 mcg at 11/02/19 1109  . LORazepam (ATIVAN) tablet 1 mg  1 mg Oral Q6H PRN Connye Burkitt, NP   1 mg at 10/26/19 0130   Or  . LORazepam (ATIVAN) injection 1 mg  1 mg Intramuscular Q6H PRN Connye Burkitt, NP      . magnesium hydroxide (MILK OF MAGNESIA) suspension 30 mL  30 mL Oral Daily PRN Burt Ek Gayland Curry, FNP      . pantoprazole (PROTONIX) EC tablet 40 mg  40 mg Oral Daily Sharma Covert, MD   40 mg at 11/03/19 0810  . traZODone (DESYREL) tablet 50 mg  50 mg Oral QHS PRN Cobos, Myer Peer, MD   50 mg at 11/02/19 2139  . valACYclovir (VALTREX) tablet 500 mg  500 mg Oral Daily Sharma Covert, MD   500 mg at 11/03/19 [id number]    Lab Results:  Results for orders placed or performed during the hospital encounter of 10/19/19 (from the past 48 hour(s))  Comprehensive metabolic panel     Status: Abnormal    Collection Time: 11/01/19  6:26 PM  Result Value Ref Range   Sodium 141 135 - 145  mmol/L   Potassium 4.7 3.5 - 5.1 mmol/L   Chloride 106 98 - 111 mmol/L   CO2 28 22 - 32 mmol/L   Glucose, Bld 69 (L) 70 - 99 mg/dL    Comment: Glucose reference range applies only to samples taken after fasting for at least 8 hours.   BUN 30 (H) 6 - 20 mg/dL   Creatinine, Ser 0.91 0.44 - 1.00 mg/dL   Calcium 8.9 8.9 - 10.3 mg/dL   Total Protein 6.7 6.5 - 8.1 g/dL   Albumin 3.6 3.5 - 5.0 g/dL   AST 34 15 - 41 U/L   ALT 41 0 - 44 U/L   Alkaline Phosphatase 61 38 - 126 U/L   Total Bilirubin 0.7 0.3 - 1.2 mg/dL   GFR calc non Af Amer >60 >60 mL/min   GFR calc Af Amer >60 >60 mL/min   Anion gap 7 5 - 15    Comment: Performed at [facility], [place] [address], [place], [place] [address]    Blood Alcohol level:  Lab Results  Component Value Date   ETH <10 123456    Metabolic Disorder Labs: Lab Results  Component Value Date   HGBA1C 5.8 (H) 10/20/2019   MPG 119.76 10/20/2019   No results found for: PROLACTIN Lab Results  Component Value Date   CHOL 164 10/20/2019   TRIG 60 10/20/2019   HDL 59 10/20/2019   CHOLHDL 2.8 10/20/2019   VLDL 12 10/20/2019   LDLCALC 93 10/20/2019   LDLCALC 124 (H) 10/08/2016    Physical Findings: AIMS: Facial and Oral Movements Muscles of Facial Expression: None, normal Lips and Perioral Area: None, normal Jaw: None, normal Tongue: None, normal,Extremity Movements Upper (arms, wrists, hands, fingers): None, normal Lower (legs, knees, ankles, toes): None, normal, Trunk Movements Neck, shoulders, hips: None, normal, Overall Severity Severity of abnormal movements (highest score from questions above): None, normal Incapacitation due to abnormal movements: None, normal Patient's awareness of abnormal movements (rate only patient's report): No Awareness, Dental Status Current problems with teeth and/or dentures?: No Does patient usually wear  dentures?: No  CIWA:    COWS:     Musculoskeletal: Strength & Muscle Tone: within normal limits Gait & Station: unsteady, uses walker for ambulation Patient leans: N/A  Psychiatric Specialty Exam: Physical Exam  Nursing note and vitals reviewed. Constitutional: She is oriented to person, place, and time. She appears well-developed and well-nourished.  Cardiovascular: Normal rate.  Respiratory: Effort normal.  Musculoskeletal:        General: Normal range of motion.  Neurological: She is alert and oriented to person, place, and time.  Skin: Skin is warm.    Review of Systems  Constitutional: Negative.   HENT: Negative.   Eyes: Negative.   Respiratory: Negative.   Cardiovascular: Negative.   Gastrointestinal: Negative.   Genitourinary: Negative.   Musculoskeletal: Negative.   Skin: Negative.   Neurological: Negative.   Psychiatric/Behavioral: Negative.     Blood pressure 121/74, pulse 72, temperature 97.8 F (36.6 C), temperature source Oral, resp. rate 18, height 5\' 4"  (1.626 m), weight 56.7 kg, SpO2 100 %.Body mass index is 21.46 kg/m.  General Appearance: Casual  Eye Contact:  Good  Speech:  Clear and Coherent and Normal Rate  Volume:  Normal  Mood:  Euthymic  Affect:  Congruent  Thought Process:  Coherent and Descriptions of Associations: Intact  Orientation:  Full (Time, Place, and Person)  Thought Content:  WDL  Suicidal Thoughts:  No  Homicidal Thoughts:  No  Memory:  Immediate;   Good Recent;   Good Remote;   Fair  Judgement:  Fair  Insight:  Fair  Psychomotor Activity:  Normal  Concentration:  Concentration: Good  Recall:  Good  Fund of Knowledge:  Good  Language:  Good  Akathisia:  No  Handed:  Right  AIMS (if indicated):     Assets:  Communication Skills Desire for Improvement Financial Resources/Insurance Resilience Social Support  ADL's:  Intact  Cognition:  WNL  Sleep:  Number of Hours: 6.75   Assessment: Patient presents pretty much  the same as she did yesterday.  Patient slept a little bit this morning but when awoke was pleasant, calm, cooperative.  Patient has been seen interacting with peers and staff on the unit appropriately.  She has been compliant with her medications and maintaining stability.  We are continuing to await the placement from [facility].  Do not suspect an answer until at a minimum of Monday.  Treatment Plan Summary: Daily contact with patient to assess and evaluate symptoms and progress in treatment and Medication management Continue Tegretol 200 mg p.o. twice daily for mood stability Continue Colace 100 mg p.o. twice daily for constipation Continue Haldol 10 mg p.o. twice daily for psychosis Continue Synthroid 50 mcg p.o. daily for hypothyroidism Continue Protonix 40 mg p.o. daily for GERD Continue trazodone 50 mg p.o. nightly as needed for insomnia Encourage group therapy participation Continue every 15 minute safety checks CSW working with [facility] awaiting disposition and expecting placement into ALF  Lewis Shock, FNP 11/03/2019, 2:22 PM

## 2019-11-03 NOTE — Progress Notes (Signed)
   11/03/19 1612  Vital Signs  Pulse Rate 94  BP 105/71  BP Method Automatic  Oxygen Therapy  SpO2 97 %  D: Patient presented with a guarded affect in the morning. Patient took all medicine. Patient denies SI/HI/AVH. Patient was out in the afternoon and went outside with group.  A:  Patient took scheduled medicine.  Support and encouragement provided. Routine safety checks conducted every 15 minutes. Patient  Informed to notify staff with any concerns.  Safety maintained. D: Patient Presents with appropriate mood and affect.  Patient was calm and cooperative during med pass and took medicine without incident.  Patient was out in open areas and was social with peers and staff.  Patient denies suicidal thoughts and self harming thoughts.

## 2019-11-03 NOTE — [facility] Group Notes (Signed)
[place] Group Notes: (Clinical Social Work)   11/03/2019      Type of Therapy:  Group Therapy   Participation Level:  Did Not Attend - was invited individually by Nurse or MHT and chose not to attend.   Blane Ohara, LCSWA 11/03/2019  5:23 PM

## 2019-11-03 NOTE — Progress Notes (Signed)
   11/03/19 2145  Psych Admission Type (Psych Patients Only)  Admission Status Voluntary  Psychosocial Assessment  Patient Complaints None  Eye Contact Fair  Facial Expression Animated  Affect Appropriate to circumstance  Speech Logical/coherent  Interaction Assertive  Motor Activity Slow;Unsteady  Appearance/Hygiene Unremarkable  Behavior Characteristics Appropriate to situation;Cooperative  Mood Pleasant  Thought Process  Coherency WDL  Content WDL  Delusions None reported or observed  Perception WDL  Hallucination None reported or observed  Judgment WDL  Confusion None  Danger to Self  Current suicidal ideation? Denies  Danger to Others  Danger to Others None reported or observed

## 2019-11-03 NOTE — Progress Notes (Signed)
   11/03/19 2145  COVID-19 Daily Checkoff  Have you had a fever (temp > 37.80C/100F)  in the past 24 hours?  No  If you have had runny nose, nasal congestion, sneezing in the past 24 hours, has it worsened? No  COVID-19 EXPOSURE  Have you traveled outside the state in the past 14 days? No  Have you been in contact with someone with a confirmed diagnosis of COVID-19 or PUI in the past 14 days without wearing appropriate PPE? No  Have you been living in the same home as a person with confirmed diagnosis of COVID-19 or a PUI (household contact)? No  Have you been diagnosed with COVID-19? No

## 2019-11-04 LAB — CBC WITH DIFFERENTIAL/PLATELET
Abs Immature Granulocytes: 0 10*3/uL (ref 0.00–0.07)
Basophils Absolute: 0.1 10*3/uL (ref 0.0–0.1)
Basophils Relative: 1 %
Eosinophils Absolute: 0.5 10*3/uL (ref 0.0–0.5)
Eosinophils Relative: 8 %
HCT: 38.1 % (ref 36.0–46.0)
Hemoglobin: 12.1 g/dL (ref 12.0–15.0)
Immature Granulocytes: 0 %
Lymphocytes Relative: 41 %
Lymphs Abs: 2.5 10*3/uL (ref 0.7–4.0)
MCH: 30.9 pg (ref 26.0–34.0)
MCHC: 31.8 g/dL (ref 30.0–36.0)
MCV: 97.2 fL (ref 80.0–100.0)
Monocytes Absolute: 0.7 10*3/uL (ref 0.1–1.0)
Monocytes Relative: 12 %
Neutro Abs: 2.3 10*3/uL (ref 1.7–7.7)
Neutrophils Relative %: 38 %
Platelets: 273 10*3/uL (ref 150–400)
RBC: 3.92 MIL/uL (ref 3.87–5.11)
RDW: 15.9 % — ABNORMAL HIGH (ref 11.5–15.5)
WBC: 6.1 10*3/uL (ref 4.0–10.5)
nRBC: 0 % (ref 0.0–0.2)

## 2019-11-04 LAB — TSH: TSH: 6.18 u[IU]/mL — ABNORMAL HIGH (ref 0.350–4.500)

## 2019-11-04 NOTE — Progress Notes (Signed)
[facility] MD Progress Note  11/04/2019 10:42 AM Mackenzie Williams  MRN:  [id number] Subjective:  Patient is a 49 year old female who presented to the [facility] emergency department on 10/19/2019 with psychotic delusions telling the emergency room staff at that time that "everyone will cease to exist". She had apparently been living in her apartment for the last 11 years, and was put out today. The patient was paranoid and did not disclose exactly why. She was transferred to our facility for evaluation and stabilization.  Objective: Patient is seen and examined.  Patient is a 49 year old female with the above-stated past psychiatric history who is seen in follow-up.  She is doing fairly well from the last time I saw her.  She is oriented today.  She still having problems getting around and uses a walker.  She is pleasant.  She is a little bit irritated over the fact that I interrupted her television program.  Her current medications include Tegretol, Haldol and trazodone.  Her vital signs are stable, she is afebrile.  She slept 6.75 hours last night.  Review of her laboratories showed essentially normal electrolytes from 5/13.  Liver function enzymes were normal.  The CBC from 4/29 was normal.  Her TSH on 5/1 was 12.539.  She is on levothyroxine.  CT scan of her head was not done.  Principal Problem: Psychosis ([place]) Diagnosis: Principal Problem:   Psychosis ([place])  Total Time spent with patient: 15 minutes  Past Psychiatric History: See admission H&P  Past Medical History:  Past Medical History:  Diagnosis Date  . Asthma   . COPD (chronic obstructive pulmonary disease) ([place])   . Depression   . Esophageal hernia   . Genital herpes   . H. pylori infection 11/2011  . Leg pain, right   . Menorrhagia    [facility]  . MRSA carrier   . Neck pain   . Rosacea   . Seasonal allergies   . Varicose veins     Past Surgical History:  Procedure Laterality Date  . ANKLE SURGERY  1999  .  CHOLECYSTECTOMY  04/07/2011   Dr Elenor Quinones dyskinesia  . ESOPHAGOGASTRODUODENOSCOPY  03/02/2012   RMR: small hiatal hernia. Gastric polyp and abnormal gastric mucosa of doubtfull clinical significance- status post biopsy (benign). No explanation for abdominal pain based on today's examination.   [name] [place]  2010  . HIP SURGERY    . UTERINE FIBROID SURGERY  2011  . UTERINE SEPTUM RESECTION  2011   Family History:  Family History  Problem Relation Age of Onset  . Hypertension Mother   . Heart disease Mother   . Hypertension Father   . Heart disease Father        Heart attack  . Stroke Father   . Liver cancer Father   . Colon cancer Other        Patient does not know beyond mother (mother was adopted)  . Anesthesia problems Neg Hx   . Hypotension Neg Hx   . Malignant hyperthermia Neg Hx   . Pseudochol deficiency Neg Hx    Family Psychiatric  History: See admission H&P Social History:  Social History   Substance and Sexual Activity  Alcohol Use No  . Alcohol/week: 0.0 standard drinks     Social History   Substance and Sexual Activity  Drug Use Not Currently  . Frequency: 1.0 times per week  . Types: Marijuana, Oxycodone   Comment: quit 5 months ago  Social History   Socioeconomic History  . Marital status: Divorced    Spouse name: Not on file  . Number of children: 0  . Years of education: Not on file  . Highest education level: Not on file  Occupational History  . Occupation: disabled    Fish farm manager: NOT EMPLOYED  Tobacco Use  . Smoking status: Current Every Day Smoker    Packs/day: 0.50    Years: 10.00    Pack years: 5.00    Types: Cigarettes  . Smokeless tobacco: Never Used  Substance and Sexual Activity  . Alcohol use: No    Alcohol/week: 0.0 standard drinks  . Drug use: Not Currently    Frequency: 1.0 times per week    Types: Marijuana, Oxycodone    Comment: quit 5 months ago  . Sexual activity: Yes    Birth control/protection:  Other-see comments, None, Condom, Rhythm    Comment: pull-out  Other Topics Concern  . Not on file  Social History Narrative  . Not on file   Social Determinants of Health   Financial Resource Strain:   . Difficulty of Paying Living Expenses:   Food Insecurity:   . Worried About Charity fundraiser in the Last Year:   . Arboriculturist in the Last Year:   Transportation Needs:   . Film/video editor (Medical):   [name] Lack of Transportation (Non-Medical):   Physical Activity:   . Days of Exercise per Week:   . Minutes of Exercise per Session:   Stress:   . Feeling of Stress :   Social Connections:   . Frequency of Communication with Friends and Family:   . Frequency of Social Gatherings with Friends and Family:   . Attends Religious Services:   . Active Member of Clubs or Organizations:   . Attends Archivist Meetings:   [name] Marital Status:    Additional Social History:                         Sleep: Good  Appetite:  Good  Current Medications: Current Facility-Administered Medications  Medication Dose Route Frequency Provider Last Rate Last Admin  . acetaminophen (TYLENOL) tablet 650 mg  650 mg Oral Q6H PRN Suella Broad, FNP   650 mg at 10/28/19 [id number]  . carbamazepine (TEGRETOL) chewable tablet 200 mg  200 mg Oral BID Sharma Covert, MD   200 mg at 11/04/19 0747  . docusate sodium (COLACE) capsule 100 mg  100 mg Oral BID Money, Lowry Ram, FNP   100 mg at 11/04/19 0747  . haloperidol (HALDOL) tablet 10 mg  10 mg Oral BID Cobos, Myer Peer, MD   10 mg at 11/04/19 0747   Or  . haloperidol lactate (HALDOL) injection 10 mg  10 mg Intramuscular BID Cobos, Fernando A, MD      . ibuprofen (ADVIL) tablet 400 mg  400 mg Oral Q6H PRN Sharma Covert, MD   400 mg at 10/28/19 1730  . levothyroxine (SYNTHROID) tablet 50 mcg  50 mcg Oral Q0600 Sharma Covert, MD   50 mcg at 11/02/19 1109  . LORazepam (ATIVAN) tablet 1 mg  1 mg Oral Q6H PRN Connye Burkitt, NP   1 mg at 10/26/19 0130   Or  . LORazepam (ATIVAN) injection 1 mg  1 mg Intramuscular Q6H PRN Connye Burkitt, NP      . magnesium hydroxide (MILK OF MAGNESIA) suspension 30  mL  30 mL Oral Daily PRN Suella Broad, FNP      . pantoprazole (PROTONIX) EC tablet 40 mg  40 mg Oral Daily Sharma Covert, MD   40 mg at 11/04/19 0747  . traZODone (DESYREL) tablet 50 mg  50 mg Oral QHS PRN Cobos, Myer Peer, MD   50 mg at 11/03/19 2144  . valACYclovir (VALTREX) tablet 500 mg  500 mg Oral Daily Sharma Covert, MD   500 mg at 11/04/19 [id number]    Lab Results: No results found for this or any previous visit (from the past 48 hour(s)).  Blood Alcohol level:  Lab Results  Component Value Date   ETH <10 123456    Metabolic Disorder Labs: Lab Results  Component Value Date   HGBA1C 5.8 (H) 10/20/2019   MPG 119.76 10/20/2019   No results found for: PROLACTIN Lab Results  Component Value Date   CHOL 164 10/20/2019   TRIG 60 10/20/2019   HDL 59 10/20/2019   CHOLHDL 2.8 10/20/2019   VLDL 12 10/20/2019   LDLCALC 93 10/20/2019   LDLCALC 124 (H) 10/08/2016    Physical Findings: AIMS: Facial and Oral Movements Muscles of Facial Expression: None, normal Lips and Perioral Area: None, normal Jaw: None, normal Tongue: None, normal,Extremity Movements Upper (arms, wrists, hands, fingers): None, normal Lower (legs, knees, ankles, toes): None, normal, Trunk Movements Neck, shoulders, hips: None, normal, Overall Severity Severity of abnormal movements (highest score from questions above): None, normal Incapacitation due to abnormal movements: None, normal Patient's awareness of abnormal movements (rate only patient's report): No Awareness, Dental Status Current problems with teeth and/or dentures?: No Does patient usually wear dentures?: No  CIWA:    COWS:     Musculoskeletal: Strength & Muscle Tone: decreased Gait & Station: unsteady Patient leans:  Right  Psychiatric Specialty Exam: Physical Exam  Nursing note and vitals reviewed. Constitutional: She appears well-developed and well-nourished.  HENT:  Head: Normocephalic and atraumatic.  Respiratory: Effort normal.  Neurological: She is alert.    Review of Systems  Blood pressure 105/71, pulse 94, temperature 97.8 F (36.6 C), temperature source Oral, resp. rate 18, height 5\' 4"  (1.626 m), weight 56.7 kg, SpO2 97 %.Body mass index is 21.46 kg/m.  General Appearance: Casual  Eye Contact:  Fair  Speech:  Normal Rate  Volume:  Normal  Mood:  Euthymic  Affect:  Congruent  Thought Process:  Goal Directed and Descriptions of Associations: Circumstantial  Orientation:  Full (Time, Place, and Person)  Thought Content:  Logical  Suicidal Thoughts:  No  Homicidal Thoughts:  No  Memory:  Immediate;   Fair Recent;   Fair Remote;   Fair  Judgement:  Impaired  Insight:  Fair  Psychomotor Activity:  Normal  Concentration:  Concentration: Fair and Attention Span: Fair  Recall:  [employer] of Knowledge:  Fair  Language:  Good  Akathisia:  Negative  Handed:  Right  AIMS (if indicated):     Assets:  Desire for Improvement Resilience  ADL's:  Impaired  Cognition:  Impaired,  Mild  Sleep:  Number of Hours: 6.75     Treatment Plan Summary: Daily contact with patient to assess and evaluate symptoms and progress in treatment, Medication management and Plan : Patient is seen and examined.  Patient is a 49 year old female with the above-stated past psychiatric history was seen in follow-up.   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  She is doing better than the last time I saw her.   Her sleep is good.  She is not having any agitation.  Her paranoia has decreased to a degree.  The FL2 was done on 5/12 for placement.  I was unable to find any notations with regard to Adult Protective Services interviewing her.  But otherwise she is doing well.  A follow-up Tegretol level has not been done.  Also would like to at least recheck a TSH and make sure it is coming down.  No changes in her medication.  Biggest issue right now is placement.  1.  Continue Tegretol 200 mg p.o. twice daily for mood stability. 2.  Continue levothyroxine 50 mcg p.o. daily for hypothyroidism. 3.  Continue Haldol 10 mg p.o. twice daily for psychosis and paranoia. 3.  Continue Protonix 40 mg p.o. daily for gastric protection. 4.  Continue trazodone 50 mg p.o. nightly as needed insomnia. 5.  Continue Valtrex 500 mg p.o. daily for prophylaxis of herpes. 6.  We will negotiate with patient and see if she is willing to get the CT scan of her head done now. 7.  Check Tegretol level and TSH.  Liver function enzymes were recently done and they are normal, will check CBC to make sure no impairment secondary to the Tegretol. 8.  Disposition planning-placement. Sharma Covert, MD 11/04/2019, 10:42 AM

## 2019-11-04 NOTE — Progress Notes (Signed)
   11/04/19 0900  Psych Admission Type (Psych Patients Only)  Admission Status Voluntary  Psychosocial Assessment  Patient Complaints None  Eye Contact Fair  Facial Expression Animated  Affect Appropriate to circumstance  Speech Logical/coherent  Interaction Assertive  Motor Activity Slow;Unsteady  Appearance/Hygiene Unremarkable  Behavior Characteristics Appropriate to situation  Mood Pleasant  Thought Process  Coherency WDL  Content WDL  Delusions None reported or observed  Perception WDL  Hallucination None reported or observed  Judgment WDL  Confusion None  Danger to Self  Current suicidal ideation? Denies  Danger to Others  Danger to Others None reported or observed

## 2019-11-04 NOTE — BHH Group Notes (Signed)
[facility] LCSW Group Therapy Note  Date/Time:  11/04/2019 11:15-12:00PM  Type of Therapy and Topic:  Group Therapy:  Healthy and Unhealthy Supports  Participation Level:  Active   Description of Group:  Patients in this group were introduced to the idea of adding a variety of healthy supports to address the various needs in their lives.Patients discussed what additional healthy supports could be helpful in their recovery and wellness after discharge in order to prevent future hospitalizations.   An emphasis was placed on using counselor, doctor, therapy groups, 12-step groups, and problem-specific support groups to expand supports.  They also worked as a group on developing a specific plan for several patients to deal with unhealthy supports through [place], psychoeducation with loved ones, and even termination of relationships.   Therapeutic Goals:   1)  discuss importance of adding supports to stay well once out of the hospital  2)  compare healthy versus unhealthy supports and identify some examples of each  3)  generate ideas and descriptions of healthy supports that can be added  4)  offer mutual support about how to address unhealthy supports  5)  encourage active participation in and adherence to discharge plan    Summary of Patient Progress:  The patient actively engaged in introductory check-in, sharing of feeling "6-7/10; a little frustrated because I'm still here but appreciate what I get here". Pt stated that current healthy supports in her life are "My mom, and God.Marland Kitchenalthough he know's I don't turn to him enough". Pt did not identify any unhealthy supports during discussion, but acknowledged and proved familiar with experiences described by other patients in group.  The patient expressed a willingness to add "Possibly support groups, or peer groups" as support(s) to help in her recovery journey. Pt proved receptive to alternate group members input and feedback from  [place].   Therapeutic Modalities:   Motivational Interviewing Brief Solution-Focused Therapy  Katherina Mires 11/04/2019  3:29 PM

## 2019-11-04 NOTE — Progress Notes (Signed)
Patient has been up in the dayroom interacting with select peers. She attended group this evening and has voiced no complaints. Patient currently denies having pain, -si/hi/a/v hall. Support and encouragement offered, safety maintained on unit, will continue to monitor.

## 2019-11-04 NOTE — Progress Notes (Signed)
   11/04/19 2145  COVID-19 Daily Checkoff  Have you had a fever (temp > 37.80C/100F)  in the past 24 hours?  No  If you have had runny nose, nasal congestion, sneezing in the past 24 hours, has it worsened? No  COVID-19 EXPOSURE  Have you traveled outside the state in the past 14 days? No  Have you been in contact with someone with a confirmed diagnosis of COVID-19 or PUI in the past 14 days without wearing appropriate PPE? No  Have you been living in the same home as a person with confirmed diagnosis of COVID-19 or a PUI (household contact)? No  Have you been diagnosed with COVID-19? No

## 2019-11-05 ENCOUNTER — Ambulatory Visit (HOSPITAL_COMMUNITY): Payer: Medicare Other

## 2019-11-05 LAB — CARBAMAZEPINE LEVEL, TOTAL: Carbamazepine Lvl: 6.9 ug/mL (ref 4.0–12.0)

## 2019-11-05 NOTE — BHH Counselor (Signed)
CSW spoke with APS Social Worker Patriciaann Clan at [phone]. Per Morey Hummingbird pt has possible placement at a family care home at [facility] [phone]. She stated that Galen Manila, who is the manager over the family care home had a few questions regarding her behavior and medications. Her direct cell phone is [phone].   CSW spoke with Galen Manila of [facility] to discuss placement at their facility. Per Chelsea Primus pt would not be able to receive Haldol IM and would only be able to take PO medications. Pt will need to be set up with outside provider for medication management and will also need to be set up with a primary care. Per [facility] is the primary care office that is used most often with their clients and the pharmacy used is [facility] in [place].  Chelsea Primus is to fax over a document stating financial responsibility for medications that Caelynn will need to sign before she is able to move to the family care home.      Darletta Moll MSW, [place] Worker  [facility]

## 2019-11-05 NOTE — Progress Notes (Signed)
   11/05/19 1400  Psych Admission Type (Psych Patients Only)  Admission Status Voluntary  Psychosocial Assessment  Patient Complaints None  Eye Contact Fair  Facial Expression Animated  Affect Appropriate to circumstance  Speech Logical/coherent  Interaction Assertive  Motor Activity Slow;Unsteady  Appearance/Hygiene Unremarkable  Behavior Characteristics Appropriate to situation  Mood Preoccupied  Thought Process  Coherency WDL  Content WDL  Delusions None reported or observed  Perception WDL  Hallucination None reported or observed  Judgment WDL  Confusion None  Danger to Self  Current suicidal ideation? Denies  Danger to Others  Danger to Others None reported or observed

## 2019-11-05 NOTE — Progress Notes (Signed)
Recreation Therapy Notes  Date: 5.17.21 Time: 1000 Location: 500 Hall Dayroom  Group Topic: Coping Skills  Goal Area(s) Addresses:  Patients will identify positive coping strategies. Patients will identify benefit of using positive coping strategies over negative coping strategies.  Behavioral Response: Engaged  Intervention: Worksheet, pencils  Activity: Healthy vs. Unhealthy Coping Strategies.  Patients were to identify a current problem they are facing, identify the unhealthy coping strategies they have used to address this problem and consequences of those coping strategies.  Patients then identified healthy coping strategies, benefits and barriers to using these strategies.  Education: Radiographer, therapeutic, Dentist.   Education Outcome: Acknowledges understanding/In group clarification offered/Needs additional education.   Clinical Observations/Feedback: Pt identified current problem as "getting housing".  Pt stated unhealthy coping strategies are being upset and being down which has led to "being more upset with no good results in housing".  Pt expressed healthy coping strategies have been to look for housing, talk to associates and be positive.  Pt expected outcome is "possible housing".  Barriers to using healthy coping strategies have been "not seeing anything different" and "doesn't seem that it's going to happen".    Mackenzie Williams, LRT/CTRS     Mackenzie Williams A 11/05/2019 11:32 AM

## 2019-11-05 NOTE — Progress Notes (Signed)
   11/05/19 2100  Psych Admission Type (Psych Patients Only)  Admission Status Voluntary  Psychosocial Assessment  Patient Complaints None  Eye Contact Fair  Facial Expression Animated  Affect Appropriate to circumstance  Speech Logical/coherent  Interaction Assertive  Motor Activity Slow;Unsteady  Appearance/Hygiene Unremarkable  Behavior Characteristics Cooperative  Mood Pleasant  Thought Process  Coherency WDL  Content WDL  Delusions None reported or observed  Perception WDL  Hallucination None reported or observed  Judgment WDL  Confusion None  Danger to Self  Current suicidal ideation? Denies  Danger to Others  Danger to Others None reported or observed   Pt seen interacting in dayroom. Pt takes meds as ordered. Pt reports a good day today.

## 2019-11-05 NOTE — Tx Team (Signed)
Interdisciplinary Treatment and Diagnostic Plan Update  11/05/2019 Time of Session: 9:25am Mackenzie Williams MRN: [id number]  Principal Diagnosis: Psychosis [facility])  Secondary Diagnoses: Principal Problem:   Psychosis ([place])   Current Medications:  Current Facility-Administered Medications  Medication Dose Route Frequency Provider Last Rate Last Admin  . acetaminophen (TYLENOL) tablet 650 mg  650 mg Oral Q6H PRN Suella Broad, FNP   650 mg at 10/28/19 [id number]  . carbamazepine (TEGRETOL) chewable tablet 200 mg  200 mg Oral BID Sharma Covert, MD   200 mg at 11/05/19 0757  . docusate sodium (COLACE) capsule 100 mg  100 mg Oral BID Money, Lowry Ram, FNP   100 mg at 11/05/19 0757  . haloperidol (HALDOL) tablet 10 mg  10 mg Oral BID Cobos, Myer Peer, MD   10 mg at 11/05/19 0757   Or  . haloperidol lactate (HALDOL) injection 10 mg  10 mg Intramuscular BID Cobos, Fernando A, MD      . ibuprofen (ADVIL) tablet 400 mg  400 mg Oral Q6H PRN Sharma Covert, MD   400 mg at 10/28/19 1730  . levothyroxine (SYNTHROID) tablet 50 mcg  50 mcg Oral Q0600 Sharma Covert, MD   50 mcg at 11/05/19 0758  . LORazepam (ATIVAN) tablet 1 mg  1 mg Oral Q6H PRN Connye Burkitt, NP   1 mg at 10/26/19 0130   Or  . LORazepam (ATIVAN) injection 1 mg  1 mg Intramuscular Q6H PRN Connye Burkitt, NP      . magnesium hydroxide (MILK OF MAGNESIA) suspension 30 mL  30 mL Oral Daily PRN Burt Ek Gayland Curry, FNP      . pantoprazole (PROTONIX) EC tablet 40 mg  40 mg Oral Daily Sharma Covert, MD   40 mg at 11/05/19 0757  . traZODone (DESYREL) tablet 50 mg  50 mg Oral QHS PRN Cobos, Myer Peer, MD   50 mg at 11/04/19 2143  . valACYclovir (VALTREX) tablet 500 mg  500 mg Oral Daily Sharma Covert, MD   500 mg at 11/05/19 0757   PTA Medications: Medications Prior to Admission  Medication Sig Dispense Refill Last Dose  . ibuprofen (ADVIL) 400 MG tablet Take 400 mg by mouth every 6 (six) hours as needed for  headache or mild pain.     . Multiple Vitamin (MULTIVITAMIN WITH MINERALS) TABS tablet Take 1 tablet by mouth daily.     . famotidine (PEPCID) 40 MG tablet Take 40 mg by mouth at bedtime.  99   . valACYclovir (VALTREX) 500 MG tablet TAKE 1 TABLET BY MOUTH ONCE DAILY NEEDS  TO  BE  SEEN  FOR  ADDITION  REFILLS 90 tablet 3     Patient Stressors: Financial difficulties Health problems Traumatic event  Patient Strengths: Capable of independent living Communication skills Supportive family/friends  Treatment Modalities: Medication Management, Group therapy, Case management,  1 to 1 session with clinician, Psychoeducation, Recreational therapy.   Physician Treatment Plan for Primary Diagnosis: Psychosis ([place]) Long Term Goal(s): Improvement in symptoms so as ready for discharge Improvement in symptoms so as ready for discharge   Short Term Goals: Ability to identify changes in lifestyle to reduce recurrence of condition will improve Ability to verbalize feelings will improve Ability to demonstrate self-control will improve Ability to identify and develop effective coping behaviors will improve  Medication Management: Evaluate patient's response, side effects, and tolerance of medication regimen.  Therapeutic Interventions: 1 to 1 sessions, Unit Group sessions and  Medication administration.  Evaluation of Outcomes: Progressing  Physician Treatment Plan for Secondary Diagnosis: Principal Problem:   Psychosis ([place])  Long Term Goal(s): Improvement in symptoms so as ready for discharge Improvement in symptoms so as ready for discharge   Short Term Goals: Ability to identify changes in lifestyle to reduce recurrence of condition will improve Ability to verbalize feelings will improve Ability to demonstrate self-control will improve Ability to identify and develop effective coping behaviors will improve     Medication Management: Evaluate patient's response, side effects, and tolerance  of medication regimen.  Therapeutic Interventions: 1 to 1 sessions, Unit Group sessions and Medication administration.  Evaluation of Outcomes: Progressing   RN Treatment Plan for Primary Diagnosis: Psychosis ([place]) Long Term Goal(s): Knowledge of disease and therapeutic regimen to maintain health will improve  Short Term Goals: Ability to participate in decision making will improve, Ability to identify and develop effective coping behaviors will improve and Compliance with prescribed medications will improve  Medication Management: RN will administer medications as ordered by provider, will assess and evaluate patient's response and provide education to patient for prescribed medication. RN will report any adverse and/or side effects to prescribing provider.  Therapeutic Interventions: 1 on 1 counseling sessions, Psychoeducation, Medication administration, Evaluate responses to treatment, Monitor vital signs and CBGs as ordered, Perform/monitor CIWA, COWS, AIMS and Fall Risk screenings as ordered, Perform wound care treatments as ordered.  Evaluation of Outcomes: Progressing   LCSW Treatment Plan for Primary Diagnosis: Psychosis ([place]) Long Term Goal(s): Safe transition to appropriate next level of care at discharge, Engage patient in therapeutic group addressing interpersonal concerns.  Short Term Goals: Engage patient in aftercare planning with referrals and resources, Increase social support, Identify triggers associated with mental health/substance abuse issues and Increase skills for wellness and recovery  Therapeutic Interventions: Assess for all discharge needs, 1 to 1 time with Social worker, Explore available resources and support systems, Assess for adequacy in community support network, Educate family and significant other(s) on suicide prevention, Complete Psychosocial Assessment, Interpersonal group therapy.  Evaluation of Outcomes: Progressing   Progress in  Treatment: Attending groups: Yes. Participating in groups: Yes. Taking medication as prescribed: Yes. Toleration medication: Yes. Family/Significant other contact made: Yes, individual(s) contacted:  mother. Patient understands diagnosis: No. Discussing patient identified problems/goals with staff: Yes. Medical problems stabilized or resolved: Yes. and No. Denies suicidal/homicidal ideation: Yes. Issues/concerns per patient self-inventory: No.  New problem(s) identified: Yes, Describe:  eviction/homelessness, limited social supports.  New Short Term/Long Term Goal(s): medication management for mood stabilization; elimination of SI thoughts; development of comprehensive mental wellness/sobriety plan.  Patient Goals: "Find a piece of mind." Patient spoke at length about the end of times and being under surveillance.   Discharge Plan or Barriers: CSW continuing to assess for appropriate referrals. APS Report and face to face interview completed. Patient is unsure about a group home placement or follow up at this time, but is receptive to some follow up.   Reason for Continuation of Hospitalization: Anxiety Hallucinations Medication stabilization  Estimated Length of Stay: TBD, placement. [place] APS involved.   Attendees: Patient:  11/05/2019   Physician:  11/05/2019   Nursing:  11/05/2019   RN Care Manager: 11/05/2019   Social Worker: Darletta Moll, Latanya Presser 11/05/2019   Recreational Therapist:  11/05/2019   Other: Marvia Pickles, [place] 11/05/2019   Other:  11/05/2019   Other: 11/05/2019     Scribe for Treatment Team: Vassie Moselle, LCSW 11/05/2019 9:56 AM

## 2019-11-05 NOTE — Progress Notes (Signed)
Adult Psychoeducational Group Note  Date:  11/05/2019 Time:  9:28 PM  Group Topic/Focus:  Wrap-Up Group:   The focus of this group is to help patients review their daily goal of treatment and discuss progress on daily workbooks.  Participation Level:  Active  Participation Quality:  Appropriate  Affect:  Appropriate  Cognitive:  Appropriate and Oriented  Insight: Appropriate  Engagement in Group:  Engaged  Modes of Intervention:  Education and Support  Additional Comments:  Patient attended and participated in group tonight. She reports having a OK day. She stated that she was depressed, oversleeping and not having activity. She reports that she was ready to leave. He SW has been assisting her with a place to live where she will not be alone. Today she also eat a lot, she went outside and play basketball, and socialized with peers.  Salley Scarlet [facility] 11/05/2019, 9:28 PM

## 2019-11-05 NOTE — Progress Notes (Signed)
[facility] MD Progress Note  11/05/2019 11:02 AM Mackenzie Williams  MRN:  [id number] Subjective:  Patient is a 49 year old female who presented to the [facility] emergency department on 10/19/2019 with psychotic delusions telling the emergency room staff at that time that "everyone will cease to exist". She had apparently been living in her apartment for the last 11 years, and was put out today. The patient was paranoid and did not disclose exactly why. She was transferred to our facility for evaluation and stabilization.  Mackenzie Williams found sitting in her room. She is oriented x3 and calmer than on admission. She continues to express delusional thought content. She states there is a "new world order" coming and that the Muslims have designed all computers to monitor people, and they are monitoring her through the lightbulbs. She does appear less agitated and focused on delusions and states, "I"m not going to talk about any of this anymore or people will think I'm wacky. I just want to find a place to live and move on." She denies SI/HI/AVH. She has been sleeping well overnight. No agitated or disruptive behaviors on the unit.  She refused a head CT scan this morning. We discussed this and rationale being new onset psychotic symptoms in her 63s. Patient became irritable and continued to refuse CT scan, stating she does not have mental health issues and is only in the hospital for housing. Labs reviewed. TSH has decreased to 6.180 from 12.539 on admission. Tegretol level 6.9. Disposition pending placement from APS.  Principal Problem: Psychosis ([place]) Diagnosis: Principal Problem:   Psychosis ([place])  Total Time spent with patient: 15 minutes  Past Psychiatric History: See admission H&P  Past Medical History:  Past Medical History:  Diagnosis Date  . Asthma   . COPD (chronic obstructive pulmonary disease) ([place])   . Depression   . Esophageal hernia   . Genital herpes   . H. pylori infection 11/2011  .  Leg pain, right   . Menorrhagia    [facility]  . MRSA carrier   . Neck pain   . Rosacea   . Seasonal allergies   . Varicose veins     Past Surgical History:  Procedure Laterality Date  . ANKLE SURGERY  1999  . CHOLECYSTECTOMY  04/07/2011   Dr Elenor Quinones dyskinesia  . ESOPHAGOGASTRODUODENOSCOPY  03/02/2012   RMR: small hiatal hernia. Gastric polyp and abnormal gastric mucosa of doubtfull clinical significance- status post biopsy (benign). No explanation for abdominal pain based on today's examination.   [name] [place]  2010  . HIP SURGERY    . UTERINE FIBROID SURGERY  2011  . UTERINE SEPTUM RESECTION  2011   Family History:  Family History  Problem Relation Age of Onset  . Hypertension Mother   . Heart disease Mother   . Hypertension Father   . Heart disease Father        Heart attack  . Stroke Father   . Liver cancer Father   . Colon cancer Other        Patient does not know beyond mother (mother was adopted)  . Anesthesia problems Neg Hx   . Hypotension Neg Hx   . Malignant hyperthermia Neg Hx   . Pseudochol deficiency Neg Hx    Family Psychiatric  History: See admission H&P Social History:  Social History   Substance and Sexual Activity  Alcohol Use No  . Alcohol/week: 0.0 standard drinks     Social History  Substance and Sexual Activity  Drug Use Not Currently  . Frequency: 1.0 times per week  . Types: Marijuana, Oxycodone   Comment: quit 5 months ago    Social History   Socioeconomic History  . Marital status: Divorced    Spouse name: Not on file  . Number of children: 0  . Years of education: Not on file  . Highest education level: Not on file  Occupational History  . Occupation: disabled    Fish farm manager: NOT EMPLOYED  Tobacco Use  . Smoking status: Current Every Day Smoker    Packs/day: 0.50    Years: 10.00    Pack years: 5.00    Types: Cigarettes  . Smokeless tobacco: Never Used  Substance and Sexual Activity  .  Alcohol use: No    Alcohol/week: 0.0 standard drinks  . Drug use: Not Currently    Frequency: 1.0 times per week    Types: Marijuana, Oxycodone    Comment: quit 5 months ago  . Sexual activity: Yes    Birth control/protection: Other-see comments, None, Condom, Rhythm    Comment: pull-out  Other Topics Concern  . Not on file  Social History Narrative  . Not on file   Social Determinants of Health   Financial Resource Strain:   . Difficulty of Paying Living Expenses:   Food Insecurity:   . Worried About Charity fundraiser in the Last Year:   . Arboriculturist in the Last Year:   Transportation Needs:   . Film/video editor (Medical):   [name] Lack of Transportation (Non-Medical):   Physical Activity:   . Days of Exercise per Week:   . Minutes of Exercise per Session:   Stress:   . Feeling of Stress :   Social Connections:   . Frequency of Communication with Friends and Family:   . Frequency of Social Gatherings with Friends and Family:   . Attends Religious Services:   . Active Member of Clubs or Organizations:   . Attends Archivist Meetings:   [name] Marital Status:    Additional Social History:                         Sleep: Good  Appetite:  Good  Current Medications: Current Facility-Administered Medications  Medication Dose Route Frequency Provider Last Rate Last Admin  . acetaminophen (TYLENOL) tablet 650 mg  650 mg Oral Q6H PRN Suella Broad, FNP   650 mg at 10/28/19 [id number]  . carbamazepine (TEGRETOL) chewable tablet 200 mg  200 mg Oral BID Sharma Covert, MD   200 mg at 11/05/19 0757  . docusate sodium (COLACE) capsule 100 mg  100 mg Oral BID Money, Lowry Ram, FNP   100 mg at 11/05/19 0757  . haloperidol (HALDOL) tablet 10 mg  10 mg Oral BID Cobos, Myer Peer, MD   10 mg at 11/05/19 0757   Or  . haloperidol lactate (HALDOL) injection 10 mg  10 mg Intramuscular BID Cobos, Fernando A, MD      . ibuprofen (ADVIL) tablet 400 mg  400 mg  Oral Q6H PRN Sharma Covert, MD   400 mg at 10/28/19 1730  . levothyroxine (SYNTHROID) tablet 50 mcg  50 mcg Oral Q0600 Sharma Covert, MD   50 mcg at 11/05/19 0758  . LORazepam (ATIVAN) tablet 1 mg  1 mg Oral Q6H PRN Connye Burkitt, NP   1 mg at 10/26/19 0130  Or  . LORazepam (ATIVAN) injection 1 mg  1 mg Intramuscular Q6H PRN Connye Burkitt, NP      . magnesium hydroxide (MILK OF MAGNESIA) suspension 30 mL  30 mL Oral Daily PRN Burt Ek Gayland Curry, FNP      . pantoprazole (PROTONIX) EC tablet 40 mg  40 mg Oral Daily Sharma Covert, MD   40 mg at 11/05/19 0757  . traZODone (DESYREL) tablet 50 mg  50 mg Oral QHS PRN Cobos, Myer Peer, MD   50 mg at 11/04/19 2143  . valACYclovir (VALTREX) tablet 500 mg  500 mg Oral Daily Sharma Covert, MD   500 mg at 11/05/19 [id number]    Lab Results:  Results for orders placed or performed during the hospital encounter of 10/19/19 (from the past 48 hour(s))  TSH     Status: Abnormal   Collection Time: 11/04/19  6:19 PM  Result Value Ref Range   TSH 6.180 (H) 0.350 - 4.500 uIU/mL    Comment: Performed by a 3rd Generation assay with a functional sensitivity of <=0.01 uIU/mL. Performed at [facility], [place] [address], [place], [place] [address]   Carbamazepine level, total     Status: None   Collection Time: 11/04/19  6:19 PM  Result Value Ref Range   Carbamazepine Lvl 6.9 4.0 - 12.0 ug/mL    Comment: Performed at [facility] [address] [place], [place] [address]  CBC with Differential/Platelet     Status: Abnormal   Collection Time: 11/04/19  6:19 PM  Result Value Ref Range   WBC 6.1 4.0 - 10.5 K/uL   RBC 3.92 3.87 - 5.11 MIL/uL   Hemoglobin 12.1 12.0 - 15.0 g/dL   HCT 38.1 36.0 - 46.0 %   MCV 97.2 80.0 - 100.0 fL   MCH 30.9 26.0 - 34.0 pg   MCHC 31.8 30.0 - 36.0 g/dL   RDW 15.9 (H) 11.5 - 15.5 %   Platelets 273 150 - 400 K/uL   nRBC 0.0 0.0 - 0.2 %   Neutrophils Relative % 38 %   Neutro Abs 2.3 1.7  - 7.7 K/uL   Lymphocytes Relative 41 %   Lymphs Abs 2.5 0.7 - 4.0 K/uL   Monocytes Relative 12 %   Monocytes Absolute 0.7 0.1 - 1.0 K/uL   Eosinophils Relative 8 %   Eosinophils Absolute 0.5 0.0 - 0.5 K/uL   Basophils Relative 1 %   Basophils Absolute 0.1 0.0 - 0.1 K/uL   Immature Granulocytes 0 %   Abs Immature Granulocytes 0.00 0.00 - 0.07 K/uL    Comment: Performed at [facility], [place] [address], [place], [place] [address]    Blood Alcohol level:  Lab Results  Component Value Date   ETH <10 123456    Metabolic Disorder Labs: Lab Results  Component Value Date   HGBA1C 5.8 (H) 10/20/2019   MPG 119.76 10/20/2019   No results found for: PROLACTIN Lab Results  Component Value Date   CHOL 164 10/20/2019   TRIG 60 10/20/2019   HDL 59 10/20/2019   CHOLHDL 2.8 10/20/2019   VLDL 12 10/20/2019   LDLCALC 93 10/20/2019   LDLCALC 124 (H) 10/08/2016    Physical Findings: AIMS: Facial and Oral Movements Muscles of Facial Expression: None, normal Lips and Perioral Area: None, normal Jaw: None, normal Tongue: None, normal,Extremity Movements Upper (arms, wrists, hands, fingers): None, normal Lower (legs, knees, ankles, toes): None, normal, Trunk Movements Neck,  shoulders, hips: None, normal, Overall Severity Severity of abnormal movements (highest score from questions above): None, normal Incapacitation due to abnormal movements: None, normal Patient's awareness of abnormal movements (rate only patient's report): No Awareness, Dental Status Current problems with teeth and/or dentures?: No Does patient usually wear dentures?: No  CIWA:    COWS:     Musculoskeletal: Strength & Muscle Tone: within normal limits Gait & Station: normal Patient leans: N/A  Psychiatric Specialty Exam: Physical Exam  Nursing note and vitals reviewed. Constitutional: She is oriented to person, place, and time. She appears well-developed and well-nourished.   Cardiovascular: Normal rate.  Respiratory: Effort normal.  Neurological: She is alert and oriented to person, place, and time.    Review of Systems  Constitutional: Negative.   Respiratory: Negative for cough and shortness of breath.   Psychiatric/Behavioral: Negative for agitation, behavioral problems, confusion, dysphoric mood, hallucinations, self-injury, sleep disturbance and suicidal ideas. The patient is not nervous/anxious and is not hyperactive.     Blood pressure 105/71, pulse 94, temperature 97.8 F (36.6 C), temperature source Oral, resp. rate 18, height 5\' 4"  (1.626 m), weight 56.7 kg, SpO2 97 %.Body mass index is 21.46 kg/m.  General Appearance: Fairly Groomed  Eye Contact:  Good  Speech:  Normal Rate  Volume:  Normal  Mood:  Euthymic  Affect:  Congruent  Thought Process:  Coherent  Orientation:  Full (Time, Place, and Person)  Thought Content:  Delusions  Suicidal Thoughts:  No  Homicidal Thoughts:  No  Memory:  Immediate;   Fair Recent;   Fair  Judgement:  Fair  Insight:  Lacking  Psychomotor Activity:  Normal  Concentration:  Concentration: Fair and Attention Span: Fair  Recall:  [employer] of Knowledge:  Fair  Language:  Good  Akathisia:  No  Handed:  Right  AIMS (if indicated):     Assets:  Communication Skills Leisure Time Resilience  ADL's:  Intact  Cognition:  WNL  Sleep:  Number of Hours: 6.25     Treatment Plan Summary: Daily contact with patient to assess and evaluate symptoms and progress in treatment and Medication management   Continue inpatient hospitalization.  Continue Haldol 10 mg PO BID for psychosis Continue Tegretol 200 mg PO BID for mood instability Continue Colace 100 mg PO BID for constipation Continue Synthroid 50 mcg PO daily for hypothyroidism Continue Protonix 40 mg PO daily for GERD Continue Valtrex 500 mg PO daily for herpes Continue Ativan 1 mg PO/IM Q6HR PRN anxiety Continue trazodone 50 mg PO QHS PRN  insomnia  Patient will participate in the therapeutic group milieu.  Discharge disposition in progress.   Connye Burkitt, NP 11/05/2019, 11:02 AM

## 2019-11-05 NOTE — Progress Notes (Signed)
SPIRITUALITY GROUP NOTE  Spirituality group facilitated by Simone Curia, MDiv, [place].  Group Description:  Group focused on topic of hope.  Patients participated in facilitated discussion around topic, connecting with one another around experiences and definitions for hope.  Group members engaged with visual explorer photos, reflecting on what hope looks like for them today.  Group engaged in discussion around how their definitions of hope are present today in hospital.   Modalities: Psycho-social ed, Adlerian, Narrative, MI Patient Progress  Chynna was present throughout group.  Noted that her faith was connected to hope.  She stated that she had not been able to connect with her faith recently, and voiced desire to reconnect.  She feels she has a purpose and is hopeful to find what God has intended for her.

## 2019-11-06 ENCOUNTER — Ambulatory Visit (HOSPITAL_COMMUNITY)
Admit: 2019-11-06 | Discharge: 2019-11-06 | Disposition: A | Discharge status: Home / Self Care | Payer: Medicare Other | Attending: Psychiatry | Admitting: Psychiatry

## 2019-11-06 ENCOUNTER — Ambulatory Visit (HOSPITAL_COMMUNITY): Payer: Medicare Other

## 2019-11-06 DIAGNOSIS — R519 Headache, unspecified: Secondary | ICD-10-CM | POA: Diagnosis not present

## 2019-11-06 MED ORDER — CARBAMAZEPINE 100 MG PO CHEW
300.0000 mg | CHEWABLE_TABLET | Freq: Two times a day (BID) | ORAL | Status: DC
  Administered 2019-11-06: 300 mg via ORAL
  Filled 2019-11-06 (×3): qty 3

## 2019-11-06 MED ORDER — HALOPERIDOL 5 MG PO TABS
10.0000 mg | ORAL_TABLET | Freq: Every day | ORAL | Status: DC
  Filled 2019-11-06: qty 2

## 2019-11-06 MED ORDER — HALOPERIDOL 5 MG PO TABS
12.5000 mg | ORAL_TABLET | Freq: Every day | ORAL | Status: DC
  Administered 2019-11-06: 12.5 mg via ORAL
  Filled 2019-11-06: qty 2.5

## 2019-11-06 NOTE — Progress Notes (Signed)
Patient states that her positive event for the day is that she had positive feelings. Her goal for tomorrow is to make the "real world" better.

## 2019-11-06 NOTE — BHH Counselor (Addendum)
CSW spoke with [facility]PS social worker Patriciaann Clan who stated that guardianship was not currently being sought for this pt. Morey Hummingbird stated that she would like to know when pt is discharged to [facility] so that she can meet her there on that day. Carrie plans to continue to follow this pt in the community and will continue to assess for possible guardianship in the future if pt becomes unwilling to remain in [facility].   10:00 CSW left VM for Patriciaann Clan and provided update on discharge plan and date.   Darletta Moll MSW, [place] Worker  [facility]

## 2019-11-06 NOTE — BHH Counselor (Signed)
CSW spoke with Galen Manila at [facility] to coordinate a discharge date for this pt. Patient is able to come their [facility] on Wednesday 11/07/19 after 1pm. Chelsea Primus stated that transport would have to be arranged for her due to not having availability to pick her up tomorrow.  An updated FL-2 will need to be sent to their facility completed and dated for 11/07/19 prior to her arrival.    Darletta Moll MSW, [place] Worker  [facility]

## 2019-11-06 NOTE — BHH Counselor (Addendum)
[id number]- CSW received call back from [place] who stated that they ([facility]) is unable to provide transportation to [facility] for this pt.   CSW left voicemail for Patriciaann Clan [phone]) regarding transportation to [facility] at discharge on 11/07/19.    Darletta Moll MSW, [place] Worker  [facility]

## 2019-11-06 NOTE — Progress Notes (Signed)
Pt went to [facility] and got Head CT.  Pt is back at [facility] and resting at this time.     11/06/19 0800  Psych Admission Type (Psych Patients Only)  Admission Status Voluntary  Psychosocial Assessment  Patient Complaints None  Eye Contact Fair  Facial Expression Animated  Affect Appropriate to circumstance  Speech Logical/coherent  Interaction Assertive  Motor Activity Slow;Unsteady  Appearance/Hygiene Unremarkable  Behavior Characteristics Cooperative  Mood Pleasant  Thought Process  Coherency WDL  Content Delusions  Delusions Paranoid  Perception WDL  Hallucination None reported or observed  Judgment Impaired  Confusion None  Danger to Self  Current suicidal ideation? Denies  Danger to Others  Danger to Others None reported or observed

## 2019-11-06 NOTE — Progress Notes (Signed)
[facility] MD Progress Note  11/06/2019 12:53 PM Mackenzie Williams  MRN:  [id number] Subjective:   Patient is a 49 year old female who presented to the [facility] emergency department on 10/19/2019 with psychotic delusions telling the emergency room staff at that time that "everyone will cease to exist". She had apparently been living in her apartment for the last 11 years, and was put out today. The patient was paranoid and did not disclose exactly why. She was transferred to our facility for evaluation and stabilization.  Objective: Patient is seen and examined.  Patient is a 49 year old female with the above-stated past psychiatric history who is seen in follow-up.  She is basically unchanged from the weekend.  She is now pleasant, but still paranoid.  Her sleep is better, but she continues to think that in the past she was poisoned somewhere another, and this led to "a brain aneurysm".  Social work informed us this morning that she has been accepted into a group home.  She is to be discharged on 11/07/2019.  We discussed getting her CT scan of the head which we have been waiting on since the beginning.  I explained that we were looking into her previous traumatic brain injury, and to see if there are any reversible causes of her memory loss.  At least right now she has agreed to this.  Her laboratories from 5/16 showed a normal CBC, and her Tegretol level was 6.9.  Her repeat TSH on 5/16 had dropped to 6.180.  Her vital signs are stable, she is afebrile.  She slept 6.25 hours last night.  No suicidality or homicidality.  No gross auditory or visual hallucinations.  Principal Problem: Psychosis ([place]) Diagnosis: Principal Problem:   Psychosis ([place])  Total Time spent with patient: 20 minutes  Past Psychiatric History: See admission H&P  Past Medical History:  Past Medical History:  Diagnosis Date  . Asthma   . COPD (chronic obstructive pulmonary disease) ([place])   . Depression   . Esophageal hernia   .  Genital herpes   . H. pylori infection 11/2011  . Leg pain, right   . Menorrhagia    [facility]  . MRSA carrier   . Neck pain   . Rosacea   . Seasonal allergies   . Varicose veins     Past Surgical History:  Procedure Laterality Date  . ANKLE SURGERY  1999  . CHOLECYSTECTOMY  04/07/2011   Dr Elenor Quinones dyskinesia  . ESOPHAGOGASTRODUODENOSCOPY  03/02/2012   RMR: small hiatal hernia. Gastric polyp and abnormal gastric mucosa of doubtfull clinical significance- status post biopsy (benign). No explanation for abdominal pain based on today's examination.   [name] [place]  2010  . HIP SURGERY    . UTERINE FIBROID SURGERY  2011  . UTERINE SEPTUM RESECTION  2011   Family History:  Family History  Problem Relation Age of Onset  . Hypertension Mother   . Heart disease Mother   . Hypertension Father   . Heart disease Father        Heart attack  . Stroke Father   . Liver cancer Father   . Colon cancer Other        Patient does not know beyond mother (mother was adopted)  . Anesthesia problems Neg Hx   . Hypotension Neg Hx   . Malignant hyperthermia Neg Hx   . Pseudochol deficiency Neg Hx    Family Psychiatric  History: See admission H&P Social History:  Social  History   Substance and Sexual Activity  Alcohol Use No  . Alcohol/week: 0.0 standard drinks     Social History   Substance and Sexual Activity  Drug Use Not Currently  . Frequency: 1.0 times per week  . Types: Marijuana, Oxycodone   Comment: quit 5 months ago    Social History   Socioeconomic History  . Marital status: Divorced    Spouse name: Not on file  . Number of children: 0  . Years of education: Not on file  . Highest education level: Not on file  Occupational History  . Occupation: disabled    Fish farm manager: NOT EMPLOYED  Tobacco Use  . Smoking status: Current Every Day Smoker    Packs/day: 0.50    Years: 10.00    Pack years: 5.00    Types: Cigarettes  . Smokeless tobacco:  Never Used  Substance and Sexual Activity  . Alcohol use: No    Alcohol/week: 0.0 standard drinks  . Drug use: Not Currently    Frequency: 1.0 times per week    Types: Marijuana, Oxycodone    Comment: quit 5 months ago  . Sexual activity: Yes    Birth control/protection: Other-see comments, None, Condom, Rhythm    Comment: pull-out  Other Topics Concern  . Not on file  Social History Narrative  . Not on file   Social Determinants of Health   Financial Resource Strain:   . Difficulty of Paying Living Expenses:   Food Insecurity:   . Worried About Charity fundraiser in the Last Year:   . Arboriculturist in the Last Year:   Transportation Needs:   . Film/video editor (Medical):   [name] Lack of Transportation (Non-Medical):   Physical Activity:   . Days of Exercise per Week:   . Minutes of Exercise per Session:   Stress:   . Feeling of Stress :   Social Connections:   . Frequency of Communication with Friends and Family:   . Frequency of Social Gatherings with Friends and Family:   . Attends Religious Services:   . Active Member of Clubs or Organizations:   . Attends Archivist Meetings:   [name] Marital Status:    Additional Social History:                         Sleep: Good  Appetite:  Good  Current Medications: Current Facility-Administered Medications  Medication Dose Route Frequency Provider Last Rate Last Admin  . acetaminophen (TYLENOL) tablet 650 mg  650 mg Oral Q6H PRN Suella Broad, FNP   650 mg at 10/28/19 [id number]  . carbamazepine (TEGRETOL) chewable tablet 300 mg  300 mg Oral BID Sharma Covert, MD      . docusate sodium (COLACE) capsule 100 mg  100 mg Oral BID Money, Lowry Ram, FNP   100 mg at 11/06/19 [id number]  . [START ON 11/07/2019] haloperidol (HALDOL) tablet 10 mg  10 mg Oral Daily Sharma Covert, MD      . haloperidol (HALDOL) tablet 12.5 mg  12.5 mg Oral QHS Sharma Covert, MD      . ibuprofen (ADVIL) tablet 400 mg   400 mg Oral Q6H PRN Sharma Covert, MD   400 mg at 10/28/19 1730  . levothyroxine (SYNTHROID) tablet 50 mcg  50 mcg Oral Q0600 Sharma Covert, MD   50 mcg at 11/06/19 0707  . LORazepam (ATIVAN) tablet 1 mg  1 mg Oral Q6H PRN Connye Burkitt, NP   1 mg at 10/26/19 0130   Or  . LORazepam (ATIVAN) injection 1 mg  1 mg Intramuscular Q6H PRN Connye Burkitt, NP      . magnesium hydroxide (MILK OF MAGNESIA) suspension 30 mL  30 mL Oral Daily PRN Burt Ek Gayland Curry, FNP      . pantoprazole (PROTONIX) EC tablet 40 mg  40 mg Oral Daily Sharma Covert, MD   40 mg at 11/06/19 0802  . traZODone (DESYREL) tablet 50 mg  50 mg Oral QHS PRN Cobos, Myer Peer, MD   50 mg at 11/04/19 2143  . valACYclovir (VALTREX) tablet 500 mg  500 mg Oral Daily Sharma Covert, MD   500 mg at 11/06/19 [id number]    Lab Results:  Results for orders placed or performed during the hospital encounter of 10/19/19 (from the past 48 hour(s))  TSH     Status: Abnormal   Collection Time: 11/04/19  6:19 PM  Result Value Ref Range   TSH 6.180 (H) 0.350 - 4.500 uIU/mL    Comment: Performed by a 3rd Generation assay with a functional sensitivity of <=0.01 uIU/mL. Performed at [facility], [place] [address], [place], [place] [address]   Carbamazepine level, total     Status: None   Collection Time: 11/04/19  6:19 PM  Result Value Ref Range   Carbamazepine Lvl 6.9 4.0 - 12.0 ug/mL    Comment: Performed at [facility] [address], [place], [place] [address]  CBC with Differential/Platelet     Status: Abnormal   Collection Time: 11/04/19  6:19 PM  Result Value Ref Range   WBC 6.1 4.0 - 10.5 K/uL   RBC 3.92 3.87 - 5.11 MIL/uL   Hemoglobin 12.1 12.0 - 15.0 g/dL   HCT 38.1 36.0 - 46.0 %   MCV 97.2 80.0 - 100.0 fL   MCH 30.9 26.0 - 34.0 pg   MCHC 31.8 30.0 - 36.0 g/dL   RDW 15.9 (H) 11.5 - 15.5 %   Platelets 273 150 - 400 K/uL   nRBC 0.0 0.0 - 0.2 %   Neutrophils Relative % 38 %   Neutro Abs  2.3 1.7 - 7.7 K/uL   Lymphocytes Relative 41 %   Lymphs Abs 2.5 0.7 - 4.0 K/uL   Monocytes Relative 12 %   Monocytes Absolute 0.7 0.1 - 1.0 K/uL   Eosinophils Relative 8 %   Eosinophils Absolute 0.5 0.0 - 0.5 K/uL   Basophils Relative 1 %   Basophils Absolute 0.1 0.0 - 0.1 K/uL   Immature Granulocytes 0 %   Abs Immature Granulocytes 0.00 0.00 - 0.07 K/uL    Comment: Performed at [facility], [place] [address] [place],  [address]    Blood Alcohol level:  Lab Results  Component Value Date   ETH <10 123456    Metabolic Disorder Labs: Lab Results  Component Value Date   HGBA1C 5.8 (H) 10/20/2019   MPG 119.76 10/20/2019   No results found for: PROLACTIN Lab Results  Component Value Date   CHOL 164 10/20/2019   TRIG 60 10/20/2019   HDL 59 10/20/2019   CHOLHDL 2.8 10/20/2019   VLDL 12 10/20/2019   LDLCALC 93 10/20/2019   LDLCALC 124 (H) 10/08/2016    Physical Findings: AIMS: Facial and Oral Movements Muscles of Facial Expression: None, normal Lips and Perioral Area: None, normal Jaw: None, normal Tongue: None, normal,Extremity  Movements Upper (arms, wrists, hands, fingers): None, normal Lower (legs, knees, ankles, toes): None, normal, Trunk Movements Neck, shoulders, hips: None, normal, Overall Severity Severity of abnormal movements (highest score from questions above): None, normal Incapacitation due to abnormal movements: None, normal Patient's awareness of abnormal movements (rate only patient's report): No Awareness, Dental Status Current problems with teeth and/or dentures?: No Does patient usually wear dentures?: No  CIWA:    COWS:     Musculoskeletal: Strength & Muscle Tone: decreased Gait & Station: unsteady Patient leans: N/A  Psychiatric Specialty Exam: Physical Exam  Nursing note and vitals reviewed. Constitutional: She appears well-developed and well-nourished.  HENT:  Head: Normocephalic and atraumatic.   Respiratory: Effort normal.  Neurological: She is alert.    Review of Systems  Blood pressure 119/71, pulse 85, temperature 97.8 F (36.6 C), temperature source Oral, resp. rate 18, height 5\' 4"  (1.626 m), weight 56.7 kg, SpO2 97 %.Body mass index is 21.46 kg/m.  General Appearance: Casual  Eye Contact:  Fair  Speech:  Pressured  Volume:  Normal  Mood:  Anxious and Dysphoric  Affect:  Labile  Thought Process:  Coherent and Descriptions of Associations: Circumstantial  Orientation:  Negative  Thought Content:  Delusions, Paranoid Ideation and Rumination  Suicidal Thoughts:  No  Homicidal Thoughts:  No  Memory:  Immediate;   Fair Recent;   Fair Remote;   Fair  Judgement:  Impaired  Insight:  Fair  Psychomotor Activity:  Increased  Concentration:  Concentration: Fair and Attention Span: Fair  Recall:  [employer] of Knowledge:  Fair  Language:  Good  Akathisia:  Negative  Handed:  Right  AIMS (if indicated):     Assets:  Desire for Improvement Resilience  ADL's:  Intact  Cognition:  Impaired,  Moderate  Sleep:  Number of Hours: 6.25     Treatment Plan Summary: Daily contact with patient to assess and evaluate symptoms and progress in treatment, Medication management and Plan : Patient is seen and examined.  Patient is a 49 year old female with the above-stated past psychiatric history who is seen in follow-up.   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  I am going to bump up her Tegretol to 300 mg p.o. twice daily, and I will slightly increase her Haldol to 10 mg p.o. daily and 12.5 mg p.o. nightly.  No other changes in her  medicines.  If everything continues to go well we will plan on discharge tomorrow to the group home.  1.  Increase Tegretol to 300 mg p.o. twice daily for mood stability. 2.  Increase Haldol to 10 mg p.o. daily and 12.5 mg p.o. nightly for psychosis and delusions. 3.  Continue levothyroxine 50 mcg p.o. daily for hypothyroidism. 4.  Continue Protonix 40 mg p.o. daily for GERD. 5.  Continue trazodone 50 mg p.o. nightly as needed insomnia. 6.  Continue Valtrex 500 mg p.o. daily for viral suppression therapy. 7.  Patient is agreed to CT scan of the head today. 8.  Disposition planning-hopefully to group home tomorrow.   Sharma Covert, MD 11/06/2019, 12:53 PM

## 2019-11-07 MED ORDER — TUBERCULIN PPD 5 UNIT/0.1ML ID SOLN
5.0000 [IU] | Freq: Once | INTRADERMAL | Status: DC
  Administered 2019-11-07: 5 [IU] via INTRADERMAL
  Filled 2019-11-07: qty 0.1

## 2019-11-07 MED ORDER — HALOPERIDOL 10 MG PO TABS: 10.0000 mg | ORAL_TABLET | Freq: Two times a day (BID) | ORAL | 0 refills | Status: AC

## 2019-11-07 MED ORDER — DOCUSATE SODIUM 100 MG PO CAPS: 100.0000 mg | ORAL_CAPSULE | Freq: Two times a day (BID) | ORAL | 0 refills | Status: AC

## 2019-11-07 MED ORDER — TRAZODONE HCL 50 MG PO TABS: 50.0000 mg | ORAL_TABLET | Freq: Every evening | ORAL | 0 refills | Status: AC | PRN

## 2019-11-07 MED ORDER — CARBAMAZEPINE 100 MG PO CHEW
200.0000 mg | CHEWABLE_TABLET | Freq: Two times a day (BID) | ORAL | Status: DC
  Administered 2019-11-07: 200 mg via ORAL
  Filled 2019-11-07 (×5): qty 2

## 2019-11-07 MED ORDER — CARBAMAZEPINE 100 MG PO CHEW: 200.0000 mg | CHEWABLE_TABLET | Freq: Two times a day (BID) | ORAL | 0 refills | Status: AC

## 2019-11-07 MED ORDER — VALACYCLOVIR HCL 500 MG PO TABS: 500.0000 mg | ORAL_TABLET | Freq: Every day | ORAL | 0 refills | Status: DC

## 2019-11-07 MED ORDER — HALOPERIDOL 5 MG PO TABS
10.0000 mg | ORAL_TABLET | Freq: Two times a day (BID) | ORAL | Status: DC
  Administered 2019-11-07: 10 mg via ORAL
  Filled 2019-11-07 (×5): qty 2

## 2019-11-07 MED ORDER — PANTOPRAZOLE SODIUM 40 MG PO TBEC: 40.0000 mg | DELAYED_RELEASE_TABLET | Freq: Every day | ORAL | 9 refills | Status: AC

## 2019-11-07 MED ORDER — LEVOTHYROXINE SODIUM 50 MCG PO TABS: 50.0000 ug | ORAL_TABLET | Freq: Every day | ORAL | 0 refills | Status: DC

## 2019-11-07 NOTE — BHH Suicide Risk Assessment (Signed)
[facility] Discharge Suicide Risk Assessment   Principal Problem: Psychosis [facility]) Discharge Diagnoses: Principal Problem:   Psychosis ([place])   Total Time spent with patient: 15 minutes  Musculoskeletal: Strength & Muscle Tone: decreased Gait & Station: shuffle Patient leans: N/A  Psychiatric Specialty Exam: Review of Systems  All other systems reviewed and are negative.   Blood pressure 99/62, pulse 66, temperature 97.8 F (36.6 C), temperature source Oral, resp. rate 18, height 5\' 4"  (1.626 m), weight 56.7 kg, SpO2 97 %.Body mass index is 21.46 kg/m.  General Appearance: Casual  Eye Contact::  Fair  Speech:  Normal Rate409  Volume:  Normal  Mood:  Anxious  Affect:  Congruent  Thought Process:  Goal Directed and Descriptions of Associations: Circumstantial  Orientation:  Negative  Thought Content:  Delusions  Suicidal Thoughts:  No  Homicidal Thoughts:  No  Memory:  Immediate;   Fair Recent;   Fair Remote;   Fair  Judgement:  Intact  Insight:  Fair  Psychomotor Activity:  Normal  Concentration:  Fair  Recall:  [employer] of Knowledge:Fair  Language: Good  Akathisia:  Negative  Handed:  Right  AIMS (if indicated):     Assets:  Desire for Improvement Resilience  Sleep:  Number of Hours: 6.25  Cognition: Impaired,  Moderate  ADL's:  Intact   Mental Status Per Nursing Assessment::   On Admission:  Suicidal ideation indicated by patient  Demographic Factors:  Caucasian, Low socioeconomic status, Living alone and Unemployed  Loss Factors: Decline in physical health and Financial problems/change in socioeconomic status  Historical Factors: Impulsivity  Risk Reduction Factors:   Positive social support  Continued Clinical Symptoms:  Bipolar Disorder:   Mixed State More than one psychiatric diagnosis Medical Diagnoses and Treatments/Surgeries  Cognitive Features That Contribute To Risk:  Thought constriction (tunnel vision)    Suicide Risk:  Minimal: No  identifiable suicidal ideation.  Patients presenting with no risk factors but with morbid ruminations; may be classified as minimal risk based on the severity of the depressive symptoms  Follow-up Information    Medicine, [facility]. Go on 11/14/2019.   Specialty: Internal Medicine Why: You are scheduled for an appointment on 11/14/19 at 12:00 pm for medication management services and to meet your new primary care physician, Dr. Sherrie Sport.  This appointment will be held in person . Contact information: [place] [phone] [id number]           Plan Of Care/Follow-up recommendations:  Activity:  ad lib  Sharma Covert, MD 11/07/2019, 7:21 AM

## 2019-11-07 NOTE — NC FL2 (Signed)
[place] LEVEL OF CARE SCREENING TOOL     IDENTIFICATION  Patient Name: Mackenzie Williams Birthdate: [birth year] Sex: female Admission Date (Current Location): 10/19/2019  [place] and [place] Number:  Kathleen Argue [id number] [place] and Address:  [facility] [place], [place] [address], [place], [place] [address]      Provider Number: [id number]  Attending Physician Name and Address:  Hampton Abbot, MD  Relative Name and Phone Number:  Mother, Felicie Morn [phone])    Current Level of Care: Hospital Recommended Level of Care: [facility] Prior Approval Number:    Date Approved/Denied:   PASRR Number: [id number]  Discharge Plan: Other (Comment)(Family Care Home)    Current Diagnoses: Patient Active Problem List   Diagnosis Date Noted  . Psychosis ([place]) 10/19/2019  . Constipation 10/05/2016  . Genital herpes 10/05/2016  . Pelvic pain in female 03/25/2015  . Smoker 03/25/2015  . Irregular menses 03/25/2015  . Chronic diarrhea 12/18/2014  . Melena 12/18/2014  . Abdominal pain, lower 12/18/2014  . BV (bacterial vaginosis) 06/11/2014  . History of herpes genitalis 06/11/2014  . Hearing loss 08/29/2013  . 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 Neck pain on right side 02/12/2013  . Fatigue 02/14/2012  . IBS (irritable bowel syndrome) 01/11/2012  . Nausea 11/22/2011  . Upper abdominal pain 11/22/2011  . H. pylori infection 11/22/2011  . Rosanna Randy syndrome 11/22/2011    Orientation RESPIRATION BLADDER Height & Weight     Self, Time, Situation, Place  Normal Continent Weight: 56.7 kg Height:  5\' 4"  (162.6 cm)  BEHAVIORAL SYMPTOMS/MOOD NEUROLOGICAL BOWEL NUTRITION STATUS      Continent Diet(Regular)  AMBULATORY STATUS COMMUNICATION OF NEEDS Skin   Independent(Uses walker) Verbally Normal                       Personal Care Assistance Level of Assistance  Bathing, Dressing, Feeding, Total care Bathing  Assistance: Independent Feeding assistance: Independent Dressing Assistance: Independent Total Care Assistance: Independent   Functional Limitations Info             SPECIAL CARE FACTORS FREQUENCY                       Contractures Contractures Info: Not present    Additional Factors Info  Allergies Code Status Info: FULL Allergies Info: Codeine           Current Medications (11/07/2019):  This is the current hospital active medication list Current Facility-Administered Medications  Medication Dose Route Frequency Provider Last Rate Last Admin  . acetaminophen (TYLENOL) tablet 650 mg  650 mg Oral Q6H PRN Suella Broad, FNP   650 mg at 10/28/19 [id number]  . carbamazepine (TEGRETOL) chewable tablet 200 mg  200 mg Oral BID Sharma Covert, MD      . docusate sodium (COLACE) capsule 100 mg  100 mg Oral BID Money, Lowry Ram, FNP   100 mg at 11/06/19 1646  . haloperidol (HALDOL) tablet 10 mg  10 mg Oral BID Sharma Covert, MD      . ibuprofen (ADVIL) tablet 400 mg  400 mg Oral Q6H PRN Sharma Covert, MD   400 mg at 11/06/19 2237  . levothyroxine (SYNTHROID) tablet 50 mcg  50 mcg Oral Q0600 Sharma Covert, MD   50 mcg at 11/06/19 0707  . LORazepam (ATIVAN) tablet 1 mg  1 mg Oral Q6H PRN Connye Burkitt, NP  1 mg at 10/26/19 0130   Or  . LORazepam (ATIVAN) injection 1 mg  1 mg Intramuscular Q6H PRN Connye Burkitt, NP      . magnesium hydroxide (MILK OF MAGNESIA) suspension 30 mL  30 mL Oral Daily PRN Burt Ek Gayland Curry, FNP      . pantoprazole (PROTONIX) EC tablet 40 mg  40 mg Oral Daily Sharma Covert, MD   40 mg at 11/06/19 0802  . traZODone (DESYREL) tablet 50 mg  50 mg Oral QHS PRN Cobos, Myer Peer, MD   50 mg at 11/04/19 2143  . tuberculin injection 5 Units  5 Units Intradermal Once Sharma Covert, MD      . valACYclovir Estell Harpin) tablet 500 mg  500 mg Oral Daily Sharma Covert, MD   500 mg at 11/06/19 0802     Discharge  Medications: Please see discharge summary for a list of discharge medications.  Relevant Imaging Results:  Relevant Lab Results:   Additional Information SSN: [id number]  Vassie Moselle, LCSWA

## 2019-11-07 NOTE — Plan of Care (Signed)
Discharge note  Patient verbalizes readiness for discharge. Follow up plan explained, AVS, Transition record and SRA given. Prescriptions and teaching provided. Belongings returned and signed for. Suicide safety plan completed and signed. Patient verbalizes understanding. Patient denies SI/HI and assures this Probation officer they will seek assistance should that change. Patient discharged to lobby to wait for Lyft.  Problem: Education: Goal: Ability to state activities that reduce stress will improve Outcome: Adequate for Discharge   Problem: Coping: Goal: Ability to identify and develop effective coping behavior will improve Outcome: Adequate for Discharge   Problem: Self-Concept: Goal: Ability to identify factors that promote anxiety will improve Outcome: Adequate for Discharge Goal: Level of anxiety will decrease Outcome: Adequate for Discharge Goal: Ability to modify response to factors that promote anxiety will improve Outcome: Adequate for Discharge   Problem: Education: Goal: Utilization of techniques to improve thought processes will improve Outcome: Adequate for Discharge Goal: Knowledge of the prescribed therapeutic regimen will improve Outcome: Adequate for Discharge   Problem: Activity: Goal: Interest or engagement in leisure activities will improve Outcome: Adequate for Discharge Goal: Imbalance in normal sleep/wake cycle will improve Outcome: Adequate for Discharge   Problem: Coping: Goal: Coping ability will improve Outcome: Adequate for Discharge Goal: Will verbalize feelings Outcome: Adequate for Discharge   Problem: Health Behavior/Discharge Planning: Goal: Ability to make decisions will improve Outcome: Adequate for Discharge Goal: Compliance with therapeutic regimen will improve Outcome: Adequate for Discharge   Problem: Role Relationship: Goal: Will demonstrate positive changes in social behaviors and relationships Outcome: Adequate for Discharge   Problem:  Safety: Goal: Ability to disclose and discuss suicidal ideas will improve Outcome: Adequate for Discharge Goal: Ability to identify and utilize support systems that promote safety will improve Outcome: Adequate for Discharge   Problem: Self-Concept: Goal: Will verbalize positive feelings about self Outcome: Adequate for Discharge Goal: Level of anxiety will decrease Outcome: Adequate for Discharge   Problem: Education: Goal: Knowledge of [facility] Education information/materials will improve Outcome: Adequate for Discharge Goal: Emotional status will improve Outcome: Adequate for Discharge Goal: Mental status will improve Outcome: Adequate for Discharge Goal: Verbalization of understanding the information provided will improve Outcome: Adequate for Discharge   Problem: Activity: Goal: Interest or engagement in activities will improve Outcome: Adequate for Discharge Goal: Sleeping patterns will improve Outcome: Adequate for Discharge   Problem: Coping: Goal: Ability to verbalize frustrations and anger appropriately will improve Outcome: Adequate for Discharge Goal: Ability to demonstrate self-control will improve Outcome: Adequate for Discharge   Problem: Health Behavior/Discharge Planning: Goal: Identification of resources available to assist in meeting health care needs will improve Outcome: Adequate for Discharge Goal: Compliance with treatment plan for underlying cause of condition will improve Outcome: Adequate for Discharge   Problem: Physical Regulation: Goal: Ability to maintain clinical measurements within normal limits will improve Outcome: Adequate for Discharge   Problem: Safety: Goal: Periods of time without injury will increase Outcome: Adequate for Discharge   Problem: Activity: Goal: Will identify at least one activity in which they can participate Outcome: Adequate for Discharge   Problem: Coping: Goal: Ability to identify and develop  effective coping behavior will improve Outcome: Adequate for Discharge Goal: Ability to interact with others will improve Outcome: Adequate for Discharge Goal: Demonstration of participation in decision-making regarding own care will improve Outcome: Adequate for Discharge Goal: Ability to use eye contact when communicating with others will improve Outcome: Adequate for Discharge   Problem: Health Behavior/Discharge Planning: Goal: Identification of resources available  to assist in meeting health care needs will improve Outcome: Adequate for Discharge   Problem: Self-Concept: Goal: Will verbalize positive feelings about self Outcome: Adequate for Discharge   Problem: Education: Goal: Ability to verbalize precipitating factors for violent behavior will improve Outcome: Adequate for Discharge   Problem: Coping: Goal: Ability to verbalize frustrations and anger appropriately will improve Outcome: Adequate for Discharge   Problem: Health Behavior/Discharge Planning: Goal: Ability to implement measures to prevent violent behavior in the future will improve Outcome: Adequate for Discharge   Problem: Safety: Goal: Ability to demonstrate self-control will improve Outcome: Adequate for Discharge Goal: Ability to redirect hostility and anger into socially appropriate behaviors will improve Outcome: Adequate for Discharge   Problem: Activity: Goal: Will verbalize the importance of balancing activity with adequate rest periods Outcome: Adequate for Discharge   Problem: Education: Goal: Will be free of psychotic symptoms Outcome: Adequate for Discharge Goal: Knowledge of the prescribed therapeutic regimen will improve Outcome: Adequate for Discharge   Problem: Coping: Goal: Coping ability will improve Outcome: Adequate for Discharge Goal: Will verbalize feelings Outcome: Adequate for Discharge   Problem: Health Behavior/Discharge Planning: Goal: Compliance with prescribed  medication regimen will improve Outcome: Adequate for Discharge   Problem: Nutritional: Goal: Ability to achieve adequate nutritional intake will improve Outcome: Adequate for Discharge   Problem: Role Relationship: Goal: Ability to communicate needs accurately will improve Outcome: Adequate for Discharge Goal: Ability to interact with others will improve Outcome: Adequate for Discharge   Problem: Safety: Goal: Ability to redirect hostility and anger into socially appropriate behaviors will improve Outcome: Adequate for Discharge Goal: Ability to remain free from injury will improve Outcome: Adequate for Discharge   Problem: Self-Care: Goal: Ability to participate in self-care as condition permits will improve Outcome: Adequate for Discharge   Problem: Self-Concept: Goal: Will verbalize positive feelings about self Outcome: Adequate for Discharge

## 2019-11-07 NOTE — Progress Notes (Signed)
   11/06/19 2040  Psych Admission Type (Psych Patients Only)  Admission Status Voluntary  Psychosocial Assessment  Patient Complaints None  Eye Contact Fair  Facial Expression Animated  Affect Appropriate to circumstance  Speech Logical/coherent  Interaction Assertive  Appearance/Hygiene Unremarkable  Behavior Characteristics Cooperative  Mood Pleasant  Thought Process  Coherency WDL  Content Delusions  Delusions Paranoid  Perception WDL  Hallucination None reported or observed  Judgment Poor  Confusion None  Danger to Self  Current suicidal ideation? Denies  Danger to Others  Danger to Others None reported or observed   Pt pleasant and stated that she had a good day. Pt wanted to know why she is taking Haldol again at night. Pt states that "I need to learn to keep my mouth shut. That is why the doctor thinks I'm paranoid because of the things I say." Pt wanted to know results of CT scan but none available at the time. Pt took meds without incident.

## 2019-11-07 NOTE — Progress Notes (Signed)
  [facility] Adult Case Management Discharge Plan :  Will you be returning to the same living situation after discharge:  No. Will be going to [facility]. At discharge, do you have transportation home?: No. Safe Transport has been arranged. Do you have the ability to pay for your medications: Yes,  insurance.   Release of information consent forms completed and in the chart;  Patient's signature needed at discharge.  Patient to Follow up at: Follow-up Information    Medicine, [facility] Internal. Go on 11/14/2019.   Specialty: Internal Medicine Why: You are scheduled for an appointment on 11/14/19 at 12:00 pm for medication management services and to meet your new primary care physician, Dr. Sherrie Sport.  This appointment will be held in person . Contact information: [place] [phone]           Next level of care provider has access to [place] and Suicide Prevention discussed: Yes,  with mother.     Has patient been referred to the Quitline?: Patient refused referral  Patient has been referred for addiction treatment: N/A  Vassie Moselle, [place] 11/07/2019, 12:52 PM

## 2019-11-07 NOTE — Progress Notes (Signed)
Pt asking for something for pain. Pt showed me her right ankle. Ankle was swollen but not red or hot to touch. Pt able to walk on it. Pt given Ibuprofen 400 mg.
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
## 2019-11-13 DIAGNOSIS — R350 Frequency of micturition: Secondary | ICD-10-CM | POA: Diagnosis not present

## 2019-11-13 DIAGNOSIS — M545 Low back pain: Secondary | ICD-10-CM | POA: Diagnosis not present

## 2020-02-10 DIAGNOSIS — Z79899 Other long term (current) drug therapy: Secondary | ICD-10-CM | POA: Diagnosis not present

## 2020-02-10 DIAGNOSIS — Z20822 Contact with and (suspected) exposure to covid-19: Secondary | ICD-10-CM | POA: Diagnosis not present

## 2020-02-10 DIAGNOSIS — E119 Type 2 diabetes mellitus without complications: Secondary | ICD-10-CM | POA: Diagnosis not present

## 2020-02-12 DIAGNOSIS — E119 Type 2 diabetes mellitus without complications: Secondary | ICD-10-CM | POA: Diagnosis not present

## 2020-02-12 DIAGNOSIS — K219 Gastro-esophageal reflux disease without esophagitis: Secondary | ICD-10-CM | POA: Diagnosis not present

## 2020-02-12 DIAGNOSIS — E039 Hypothyroidism, unspecified: Secondary | ICD-10-CM | POA: Diagnosis not present

## 2020-03-19 ENCOUNTER — Telehealth: Payer: Self-pay | Admitting: Family Medicine

## 2020-03-19 NOTE — Telephone Encounter (Signed)
If she feels the patient is a potential harm to herself she can go through the magistrate's office or through clerk of courts to try to seek involuntary committal.  In order to get any type of legal guardianship for the patient she would have to go through the court system and psychiatry.  She would also obviously have to get the patient willing to go to psychiatry.  The patient not willing to go anywhere then once again involuntary committal could occur if the patient was of threat to herself once again I would recommend this be through the magistrate's office or the clerk of courts We are willing to see the patient but we do not have the psychiatric authority to declare her incompetent

## 2020-03-19 NOTE — Telephone Encounter (Signed)
Certainly a sad situation we are happy to help with the patient does come

## 2020-03-19 NOTE — Telephone Encounter (Signed)
FYI : Mother states she went thru the courts and had her involuntarily committed to [facility] and they ended up letting her go ( gets better with meds) but she won't take her meds and has gotten aggressive and very paranoid and can't stay with her any more and is facing homelessness and she is going to go see if the court can do anything else for this situation.

## 2020-03-19 NOTE — Telephone Encounter (Signed)
Pt's mom called, very concerned for pt  States that the pt is having severe mental issues, refusing doc appointments, refusing to take any meds, very paranoid, won't leave home or pay her bills, seems to out of touch with reality, beligerent, yells at mom  Mom states she spoke with the clerk of court who recommended she try to get guardianship of pt   What can she do?  Anything we can do?  Please advise

## 2020-03-24 DIAGNOSIS — E119 Type 2 diabetes mellitus without complications: Secondary | ICD-10-CM | POA: Diagnosis not present

## 2020-03-24 DIAGNOSIS — Z79899 Other long term (current) drug therapy: Secondary | ICD-10-CM | POA: Diagnosis not present

## 2020-03-24 DIAGNOSIS — F17210 Nicotine dependence, cigarettes, uncomplicated: Secondary | ICD-10-CM | POA: Diagnosis not present

## 2020-04-06 DIAGNOSIS — R300 Dysuria: Secondary | ICD-10-CM | POA: Diagnosis not present

## 2020-04-06 DIAGNOSIS — L309 Dermatitis, unspecified: Secondary | ICD-10-CM | POA: Diagnosis not present

## 2020-04-07 DIAGNOSIS — E039 Hypothyroidism, unspecified: Secondary | ICD-10-CM | POA: Diagnosis not present

## 2020-04-07 DIAGNOSIS — K219 Gastro-esophageal reflux disease without esophagitis: Secondary | ICD-10-CM | POA: Diagnosis not present

## 2020-04-07 DIAGNOSIS — K30 Functional dyspepsia: Secondary | ICD-10-CM | POA: Diagnosis not present

## 2020-04-07 DIAGNOSIS — R21 Rash and other nonspecific skin eruption: Secondary | ICD-10-CM | POA: Diagnosis not present

## 2020-07-01 ENCOUNTER — Other Ambulatory Visit: Payer: Self-pay | Admitting: Obstetrics & Gynecology

## 2020-09-04 NOTE — Congregational Nurse Program (Signed)
Has psychomotor problem of constantly pacing around room and talking to herself. Stated she has a PCP but she does not trust any doctor but wouldn't tell me why. BP - 132/77, P-76. Erma Heritage RN, [place], [phone]

## 2021-01-17 DIAGNOSIS — F17210 Nicotine dependence, cigarettes, uncomplicated: Secondary | ICD-10-CM | POA: Diagnosis not present

## 2021-01-17 DIAGNOSIS — Z79899 Other long term (current) drug therapy: Secondary | ICD-10-CM | POA: Diagnosis not present

## 2021-01-17 DIAGNOSIS — Z5900 Homelessness unspecified: Secondary | ICD-10-CM | POA: Diagnosis not present

## 2021-10-15 DIAGNOSIS — X58XXXA Exposure to other specified factors, initial encounter: Secondary | ICD-10-CM | POA: Diagnosis not present

## 2021-10-15 DIAGNOSIS — F17210 Nicotine dependence, cigarettes, uncomplicated: Secondary | ICD-10-CM | POA: Diagnosis not present

## 2021-10-15 DIAGNOSIS — S39012A Strain of muscle, fascia and tendon of lower back, initial encounter: Secondary | ICD-10-CM | POA: Diagnosis not present

## 2021-10-15 DIAGNOSIS — Z5900 Homelessness unspecified: Secondary | ICD-10-CM | POA: Diagnosis not present

## 2021-11-23 ENCOUNTER — Encounter: Payer: Self-pay | Admitting: Family Medicine

## 2021-11-23 ENCOUNTER — Ambulatory Visit: Payer: Medicare Other | Admitting: Family Medicine

## 2021-12-28 DIAGNOSIS — Z79899 Other long term (current) drug therapy: Secondary | ICD-10-CM | POA: Diagnosis not present

## 2021-12-28 DIAGNOSIS — F32A Depression, unspecified: Secondary | ICD-10-CM | POA: Diagnosis not present

## 2021-12-28 DIAGNOSIS — Z20822 Contact with and (suspected) exposure to covid-19: Secondary | ICD-10-CM | POA: Diagnosis not present

## 2021-12-28 DIAGNOSIS — Z885 Allergy status to narcotic agent status: Secondary | ICD-10-CM | POA: Diagnosis not present

## 2021-12-28 DIAGNOSIS — F17210 Nicotine dependence, cigarettes, uncomplicated: Secondary | ICD-10-CM | POA: Diagnosis not present

## 2021-12-28 DIAGNOSIS — E079 Disorder of thyroid, unspecified: Secondary | ICD-10-CM | POA: Diagnosis not present

## 2021-12-31 DIAGNOSIS — F17200 Nicotine dependence, unspecified, uncomplicated: Secondary | ICD-10-CM | POA: Diagnosis not present

## 2021-12-31 DIAGNOSIS — K219 Gastro-esophageal reflux disease without esophagitis: Secondary | ICD-10-CM | POA: Diagnosis not present

## 2021-12-31 DIAGNOSIS — E039 Hypothyroidism, unspecified: Secondary | ICD-10-CM | POA: Diagnosis not present

## 2023-08-05 ENCOUNTER — Ambulatory Visit: Payer: Self-pay

## 2023-08-05 NOTE — Telephone Encounter (Signed)
Chief Complaint: bilateral feet swelling Symptoms: bilateral feet swelling and pain Frequency: x 2 weeks Pertinent Negatives: Patient's mother denies redness or streaks, fevers, chest pain, difficulty breathing. Disposition: [] ED /[x] Urgent Care (no appt availability in office) / [] Appointment(In office/virtual)/ []  [place] [facility]/ [] Home Care/ [] Refused Recommended Disposition /[] [place] [facility]/ []  Follow-up with PCP Additional Notes: Triage limited due to patient is admitted to a mental health facility. Mother last saw patient about a week ago. Mother states she has a hard time answering triage questions due to patient has been noncompliant with her healthcare. She states the patient has a follow up appointment on 08/18/23 with [facility] regarding a fall and facial fractures. Advised patient be seen sooner at urgent care within the next 3 days for ankle swelling. Mother agreeable and states she will be seeing the patient this weekend. Mother scheduled patient for new patient appointment to get established with Dr Durwin Nora on April 15th.  Copied from CRM [id number]. Topic: Clinical - Red Word Triage >> Aug 05, 2023 12:40 PM Payton Doughty wrote: Red Word that prompted transfer to Nurse Triage: swelling in feet. Mom in legal guardian, and calling for a new pt appt.  Mothing until 10/04/2023 Reason for Disposition  [1] MILD swelling of both ankles (i.e., pedal edema) AND [2] new-onset or worsening  Answer Assessment - Initial Assessment Questions 1. ONSET: "When did the swelling start?" (e.g., minutes, hours, days)     Mother states she saw the patient 2 weeks ago and noticed it.  2. LOCATION: "What part of the leg is swollen?"  "Are both legs swollen or just one leg?"     Both feet, mother doesn't note any swelling up the legs.  3. SEVERITY: "How bad is the swelling?" (e.g., localized; mild, moderate, severe)   - Localized: Small area of swelling localized to one leg.   - MILD pedal edema:  Swelling limited to foot and ankle, pitting edema < 1/4 inch (6 mm) deep, rest and elevation eliminate most or all swelling.   - MODERATE edema: Swelling of lower leg to knee, pitting edema > 1/4 inch (6 mm) deep, rest and elevation only partially reduce swelling.   - SEVERE edema: Swelling extends above knee, facial or hand swelling present.      She states the socks were indented around her ankles.  4. REDNESS: "Does the swelling look red or infected?"     Denies.  5. PAIN: "Is the swelling painful to touch?" If Yes, ask: "How painful is it?"   (Scale 1-10; mild, moderate or severe)     Yes, unable to assess pain level/severity.  6. FEVER: "Do you have a fever?" If Yes, ask: "What is it, how was it measured, and when did it start?"      Denies.  7. CAUSE: "What do you think is causing the leg swelling?"     Mother states patient has a history of left ankle fractured so severely they had to replace it with metal and screws.  8. MEDICAL HISTORY: "Do you have a history of blood clots (e.g., DVT), cancer, heart failure, kidney disease, or liver failure?"     Denies.  9. RECURRENT SYMPTOM: "Have you had leg swelling before?" If Yes, ask: "When was the last time?" "What happened that time?"     She states it has swollen before but not this bad. Mother states it is hard to say yes or no to anything because the patient has been noncompliant for so long.  10. OTHER SYMPTOMS: "Do you have any other symptoms?" (e.g., chest pain, difficulty breathing)       Denies.  Protocols used: Leg Swelling and Edema-A-AH

## 2023-10-04 ENCOUNTER — Ambulatory Visit (INDEPENDENT_AMBULATORY_CARE_PROVIDER_SITE_OTHER): Payer: Self-pay | Admitting: Internal Medicine

## 2023-10-04 ENCOUNTER — Encounter: Payer: Self-pay | Admitting: Internal Medicine

## 2023-10-04 VITALS — BP 117/79 | HR 95 | Ht 64.0 in | Wt 156.0 lb

## 2023-10-04 DIAGNOSIS — Z131 Encounter for screening for diabetes mellitus: Secondary | ICD-10-CM

## 2023-10-04 DIAGNOSIS — Z124 Encounter for screening for malignant neoplasm of cervix: Secondary | ICD-10-CM | POA: Diagnosis not present

## 2023-10-04 DIAGNOSIS — Z1329 Encounter for screening for other suspected endocrine disorder: Secondary | ICD-10-CM

## 2023-10-04 DIAGNOSIS — Z1211 Encounter for screening for malignant neoplasm of colon: Secondary | ICD-10-CM

## 2023-10-04 DIAGNOSIS — Z1321 Encounter for screening for nutritional disorder: Secondary | ICD-10-CM

## 2023-10-04 DIAGNOSIS — F209 Schizophrenia, unspecified: Secondary | ICD-10-CM

## 2023-10-04 DIAGNOSIS — Z13220 Encounter for screening for lipoid disorders: Secondary | ICD-10-CM | POA: Diagnosis not present

## 2023-10-04 DIAGNOSIS — E663 Overweight: Secondary | ICD-10-CM

## 2023-10-04 DIAGNOSIS — Z8619 Personal history of other infectious and parasitic diseases: Secondary | ICD-10-CM

## 2023-10-04 NOTE — Progress Notes (Unsigned)
 New Patient Office Visit  Subjective    Patient ID: Mackenzie Williams, female    DOB: 1970-09-07  Age: 53 y.o. MRN: [id number]  CC:  Chief Complaint  Patient presents with   Establish Care    HPI Mackenzie Williams presents to establish care.  She is a 53 year old woman with a previously documented past medical history significant for schizophrenia.  Recent medical records are limited.  She is currently living at [facility] after being discharged from behavioral health at [facility] in [place], [place].  Recently treated at [facility] for a right orbital fracture.  She is accompanied by her mother today.  Ms. Stiverson reports feeling well.  She is asymptomatic and her only concern is wanting people to stop supervising her so closely.  She endorses former tobacco use, quitting cigarettes 4 years ago.  Denies alcohol and illicit drug use.  Family medical history is significant for HTN, CKD, CAD, CVA.  Acute concerns, chronic medical conditions, and outstanding preventative care items discussed today are individually addressed in A/P below.   Outpatient Encounter Medications as of 10/04/2023  Medication Sig   amLODipine (NORVASC) 2.5 MG tablet Take 2.5 mg by mouth 2 (two) times daily.   divalproex (DEPAKOTE) 250 MG DR tablet Take by mouth daily.   divalproex (DEPAKOTE) 500 MG DR tablet Take by mouth daily.   docusate sodium (COLACE) 100 MG capsule Take 1 capsule (100 mg total) by mouth 2 (two) times daily.   haloperidol (HALDOL) 10 MG tablet Take 1 tablet (10 mg total) by mouth 2 (two) times daily.   ibuprofen (ADVIL) 400 MG tablet Take 400 mg by mouth every 6 (six) hours as needed for headache or mild pain.   valACYclovir (VALTREX) 500 MG tablet TAKE 1 TABLET BY MOUTH ONCE DAILY. NEEDS TO BE SEEN FOR ADDITIONAL REFILLS   carbamazepine (TEGRETOL) 100 MG chewable tablet Chew 2 tablets (200 mg total) by mouth 2 (two) times daily.   levothyroxine (SYNTHROID) 50 MCG  tablet Take 1 tablet (50 mcg total) by mouth daily at 6 (six) AM.   pantoprazole (PROTONIX) 40 MG tablet Take 1 tablet (40 mg total) by mouth daily.   traZODone (DESYREL) 50 MG tablet Take 1 tablet (50 mg total) by mouth at bedtime as needed for sleep.   No facility-administered encounter medications on file as of 10/04/2023.   Past Medical History:  Diagnosis Date   Asthma    COPD (chronic obstructive pulmonary disease) (HCC)    Depression    Esophageal hernia    Genital herpes    H. pylori infection 11/2011   Leg pain, right    Menorrhagia    [facility]   MRSA carrier    Neck pain    Rosacea    Seasonal allergies    Varicose veins    Past Surgical History:  Procedure Laterality Date   ANKLE SURGERY  1999   CHOLECYSTECTOMY  04/07/2011   Dr Matilde Haymaker dyskinesia   ESOPHAGOGASTRODUODENOSCOPY  03/02/2012   RMR: small hiatal hernia. Gastric polyp and abnormal gastric mucosa of doubtfull clinical significance- status post biopsy (benign). No explanation for abdominal pain based on today's examination.    FIBULA FRACTURE SURGERY  2010   HIP SURGERY     UTERINE FIBROID SURGERY  2011   UTERINE SEPTUM RESECTION  2011   Family History  Problem Relation Age of Onset   Hypertension Mother    Heart disease Mother  Hypertension Father    Heart disease Father        Heart attack   Stroke Father    Liver cancer Father    Colon cancer Other        Patient does not know beyond mother (mother was adopted)   Anesthesia problems Neg Hx    Hypotension Neg Hx    Malignant hyperthermia Neg Hx    Pseudochol deficiency Neg Hx    Social History   Socioeconomic History   Marital status: Divorced    Spouse name: Not on file   Number of children: 0   Years of education: Not on file   Highest education level: Not on file  Occupational History   Occupation: disabled    Employer: NOT EMPLOYED  Tobacco Use   Smoking status: Every Day    Current packs/day: 0.50    Average  packs/day: 0.5 packs/day for 10.0 years (5.0 ttl pk-yrs)    Types: Cigarettes   Smokeless tobacco: Never  Vaping Use   Vaping status: Never Used  Substance and Sexual Activity   Alcohol use: No    Alcohol/week: 0.0 standard drinks of alcohol   Drug use: Not Currently    Frequency: 1.0 times per week    Types: Marijuana, Oxycodone    Comment: quit 5 months ago   Sexual activity: Yes    Birth control/protection: Other-see comments, None, Condom, Rhythm    Comment: pull-out  Other Topics Concern   Not on file  Social History Narrative   Not on file   Social Drivers of Health   Financial Resource Strain: Not on file  Food Insecurity: Not on file  Transportation Needs: Not on file  Physical Activity: Not on file  Stress: Not on file  Social Connections: Not on file  Intimate Partner Violence: Not on file   Review of Systems  Constitutional:  Negative for chills and fever.  HENT:  Negative for sore throat.   Respiratory:  Negative for cough and shortness of breath.   Cardiovascular:  Negative for chest pain, palpitations and leg swelling.  Gastrointestinal:  Negative for abdominal pain, blood in stool, constipation, diarrhea, nausea and vomiting.  Genitourinary:  Negative for dysuria and hematuria.  Musculoskeletal:  Negative for myalgias.  Skin:  Negative for itching and rash.  Neurological:  Negative for dizziness and headaches.  Psychiatric/Behavioral:  Negative for depression and suicidal ideas.    Objective    BP 117/79   Pulse 95   Ht 5\' 4"  (1.626 m)   Wt 156 lb (70.8 kg)   SpO2 94%   BMI 26.78 kg/m   Physical Exam Vitals reviewed.  Constitutional:      General: She is not in acute distress.    Appearance: Normal appearance. She is not toxic-appearing.  HENT:     Head: Normocephalic and atraumatic.     Right Ear: External ear normal.     Left Ear: External ear normal.     Nose: Nose normal. No congestion or rhinorrhea.     Mouth/Throat:     Mouth: Mucous  membranes are moist.     Pharynx: Oropharynx is clear. No oropharyngeal exudate or posterior oropharyngeal erythema.  Eyes:     General: No scleral icterus.    Extraocular Movements: Extraocular movements intact.     Conjunctiva/sclera: Conjunctivae normal.     Pupils: Pupils are equal, round, and reactive to light.  Cardiovascular:     Rate and Rhythm: Normal rate and regular rhythm.  Pulses: Normal pulses.     Heart sounds: Normal heart sounds. No murmur heard.    No friction rub. No gallop.  Pulmonary:     Effort: Pulmonary effort is normal.     Breath sounds: Normal breath sounds. No wheezing, rhonchi or rales.  Abdominal:     General: Abdomen is flat. Bowel sounds are normal. There is no distension.     Palpations: Abdomen is soft.     Tenderness: There is no abdominal tenderness.  Musculoskeletal:        General: No swelling. Normal range of motion.     Cervical back: Normal range of motion.     Right lower leg: No edema.     Left lower leg: No edema.  Lymphadenopathy:     Cervical: No cervical adenopathy.  Skin:    General: Skin is warm and dry.     Capillary Refill: Capillary refill takes less than 2 seconds.     Coloration: Skin is not jaundiced.  Neurological:     General: No focal deficit present.     Mental Status: She is alert and oriented to person, place, and time.     Gait: Gait abnormal (Ambulates with a cane, favoring left leg).  Psychiatric:        Mood and Affect: Mood normal.        Behavior: Behavior normal.    Assessment & Plan:   Problem List Items Addressed This Visit       History of herpes genitalis   Documented history of herpes genitalis.  She is prescribed Valtrex 500 mg daily.      Schizophrenia [facility])   New patient presenting today to establish care.  Recent medical records are limited.  She has a documented history of schizophrenia complicated by medication noncompliance.  ED presentation in October 2024 in the setting of acute  psychosis where it is documented that she has a history of schizophrenia with chronic hostility, chronic mood lability, chronic impulsivity, chronic poor judgment, and rigid thinking.  She was placed under IVC and ultimately accepted at [facility].  Recently discharged and is currently living [facility].  Her updated medication list is not available.  Her mother states that she is taking Haldol.  It also appears that a prescription for Depakote has recently been filled.  Her mother states that she has established care with psychiatry at [facility]. -No medication changes were made today.  Will request records from [facility], check basic labs, and arrange follow-up for 6 weeks for reassessment.      Colon cancer screening - Primary   Cologuard ordered today      Cervical cancer screening   Gynecology referral placed today      Return in about 6 weeks (around 11/15/2023).   Tobi Fortes, MD

## 2023-10-04 NOTE — Patient Instructions (Signed)
 It was a pleasure to see you today.  Thank you for giving us  the opportunity to be involved in your care.  Below is a brief recap of your visit and next steps.  We will plan to see you again in 6 weeks.  Summary You have established care today No medication changes were made Basic labs ordered Will request records from [facility] Follow up in 6 weeks for reassessment

## 2023-10-05 ENCOUNTER — Encounter: Payer: Self-pay | Admitting: Internal Medicine

## 2023-10-05 DIAGNOSIS — Z124 Encounter for screening for malignant neoplasm of cervix: Secondary | ICD-10-CM | POA: Insufficient documentation

## 2023-10-05 DIAGNOSIS — F209 Schizophrenia, unspecified: Secondary | ICD-10-CM | POA: Insufficient documentation

## 2023-10-05 DIAGNOSIS — Z1211 Encounter for screening for malignant neoplasm of colon: Secondary | ICD-10-CM | POA: Insufficient documentation

## 2023-10-05 LAB — CBC WITH DIFFERENTIAL/PLATELET
Basophils Absolute: 0.1 10*3/uL (ref 0.0–0.2)
Basos: 1 %
EOS (ABSOLUTE): 0.2 10*3/uL (ref 0.0–0.4)
Eos: 4 %
Hematocrit: 43.6 % (ref 34.0–46.6)
Hemoglobin: 14.3 g/dL (ref 11.1–15.9)
Immature Grans (Abs): 0 10*3/uL (ref 0.0–0.1)
Immature Granulocytes: 0 %
Lymphocytes Absolute: 2.2 10*3/uL (ref 0.7–3.1)
Lymphs: 43 %
MCH: 30.6 pg (ref 26.6–33.0)
MCHC: 32.8 g/dL (ref 31.5–35.7)
MCV: 93 fL (ref 79–97)
Monocytes Absolute: 0.5 10*3/uL (ref 0.1–0.9)
Monocytes: 9 %
Neutrophils Absolute: 2.2 10*3/uL (ref 1.4–7.0)
Neutrophils: 43 %
Platelets: 294 10*3/uL (ref 150–450)
RBC: 4.68 x10E6/uL (ref 3.77–5.28)
RDW: 12.9 % (ref 11.7–15.4)
WBC: 5.1 10*3/uL (ref 3.4–10.8)

## 2023-10-05 LAB — CMP14+EGFR
ALT: 10 IU/L (ref 0–32)
AST: 15 IU/L (ref 0–40)
Albumin: 4.2 g/dL (ref 3.8–4.9)
Alkaline Phosphatase: 92 IU/L (ref 44–121)
BUN/Creatinine Ratio: 18 (ref 9–23)
BUN: 12 mg/dL (ref 6–24)
Bilirubin Total: 0.4 mg/dL (ref 0.0–1.2)
CO2: 23 mmol/L (ref 20–29)
Calcium: 9.8 mg/dL (ref 8.7–10.2)
Chloride: 105 mmol/L (ref 96–106)
Creatinine, Ser: 0.68 mg/dL (ref 0.57–1.00)
Globulin, Total: 3.2 g/dL (ref 1.5–4.5)
Glucose: 114 mg/dL — ABNORMAL HIGH (ref 70–99)
Potassium: 4.4 mmol/L (ref 3.5–5.2)
Sodium: 142 mmol/L (ref 134–144)
Total Protein: 7.4 g/dL (ref 6.0–8.5)
eGFR: 103 mL/min/{1.73_m2} (ref >59)

## 2023-10-05 LAB — LIPID PANEL
Chol/HDL Ratio: 3 ratio (ref 0.0–4.4)
Cholesterol, Total: 180 mg/dL (ref 100–199)
HDL: 61 mg/dL (ref >39)
LDL Chol Calc (NIH): 104 mg/dL — ABNORMAL HIGH (ref 0–99)
Triglycerides: 79 mg/dL (ref 0–149)
VLDL Cholesterol Cal: 15 mg/dL (ref 5–40)

## 2023-10-05 LAB — TSH+FREE T4
Free T4: 1.06 ng/dL (ref 0.82–1.77)
TSH: 7.08 u[IU]/mL — ABNORMAL HIGH (ref 0.450–4.500)

## 2023-10-05 LAB — B12 AND FOLATE PANEL
Folate: 16.9 ng/mL (ref >3.0)
Vitamin B-12: 694 pg/mL (ref 232–1245)

## 2023-10-05 LAB — HEMOGLOBIN A1C
Est. average glucose Bld gHb Est-mCnc: 114 mg/dL
Hgb A1c MFr Bld: 5.6 % (ref 4.8–5.6)

## 2023-10-05 NOTE — Assessment & Plan Note (Signed)
 Documented history of herpes genitalis.  She is prescribed Valtrex 500 mg daily.

## 2023-10-05 NOTE — Assessment & Plan Note (Signed)
Gynecology referral placed today

## 2023-10-05 NOTE — Assessment & Plan Note (Signed)
 New patient presenting today to establish care.  Recent medical records are limited.  She has a documented history of schizophrenia complicated by medication noncompliance.  ED presentation in October 2024 in the setting of acute psychosis where it is documented that she has a history of schizophrenia with chronic hostility, chronic mood lability, chronic impulsivity, chronic poor judgment, and rigid thinking.  She was placed under IVC and ultimately accepted at [facility].  Recently discharged and is currently living at [facility].  Her updated medication list is not available.  Her mother states that she is taking Haldol.  It also appears that a prescription for Depakote has recently been filled.  Her mother states that she has established care with psychiatry at [facility]. -No medication changes were made today.  Will request records from [facility], check basic labs, and arrange follow-up for 6 weeks for reassessment.

## 2023-10-05 NOTE — Assessment & Plan Note (Signed)
 Cologuard ordered today

## 2023-10-06 ENCOUNTER — Other Ambulatory Visit: Payer: Self-pay

## 2023-10-06 ENCOUNTER — Telehealth: Payer: Self-pay

## 2023-10-06 MED ORDER — LEVOTHYROXINE SODIUM 50 MCG PO TABS
50.0000 ug | ORAL_TABLET | Freq: Every day | ORAL | 0 refills | Status: DC
Start: 2023-10-06 — End: 2023-10-21

## 2023-10-06 NOTE — Telephone Encounter (Signed)
 Pt's mother called for update on request, advised that request is in process. Pt's mother is asking for a call back from the clinic today, has further questions   Best contact: [phone]

## 2023-10-06 NOTE — Telephone Encounter (Signed)
 Copied from CRM [id number]. Topic: Clinical - Medication Refill >> Oct 06, 2023 10:08 AM Annice Kim wrote: Most Recent Primary Care Visit:  Provider: DIXON, PHILLIP E  Department: [facility]  Visit Type: NEW PT - OFFICE VISIT  Date: 10/04/2023  Medication: levothyroxine (SYNTHROID) 50 MCG tablet [[id number]]  Has the patient contacted their pharmacy? Yes (Agent: If no, request that the patient contact the pharmacy for the refill. If patient does not wish to contact the pharmacy document the reason why and proceed with request.) (Agent: If yes, when and what did the pharmacy advise?)  Is this the correct pharmacy for this prescription? Yes If no, delete pharmacy and type the correct one.  This is the patient's preferred pharmacy:    [facility], [place] - [address] [place] [address] Phone: [phone] Fax: [phone]   Has the prescription been filled recently? Yes  Is the patient out of the medication? Yes  Has the patient been seen for an appointment in the last year OR does the patient have an upcoming appointment? Yes  Can we respond through MyChart? No  Agent: Please be advised that Rx refills may take up to 3 business days. We ask that you follow-up with your pharmacy.  Please contact patient [phone]   Called to verify Pharmacy for this refill---left patient a voicemail to call back verifying which Pharmacy for this prescription.   10:42 am: Spoke with patient's mother and she verified Pharmacy.--[facility] [place], [place]. Mother was calling to get Levothyroxine (synthroid) refilled for the patient. Patient's mother states that the patient is at a Facility [facility] and  [facility] contact number is [phone].  She states that Dr Kermit Ped and Kathline Paras both wanted to know the patient's medications and the mother wants to make sure that both providers have all the information about  the patient's current medications. Please reach out to Kathline Paras per patient to see how to release medications to Dr Kermit Ped.

## 2023-10-06 NOTE — Telephone Encounter (Signed)
 Refills sent

## 2023-10-07 NOTE — Telephone Encounter (Unsigned)
 Copied from CRM [id number]. Topic: Clinical - Medication Question >> Oct 07, 2023 12:48 PM Baldomero Bone wrote: Reason for CRM: Patient's mother, Eveleen Hinds, states she received a call from facility patient is in regarding levothyroxine  (SYNTHROID ) 50 MCG tablet. Lafrances Pigeon, caregiver, needs a form filled to administer patient's medication. Callback number is [phone]

## 2023-10-10 NOTE — Telephone Encounter (Signed)
 Called back the facility again, he is requesting a call back from the nurse or provider please. [facility] the owner to the facility to discuss how to handle medications for patient they do not have no forms, must have an order or something fax to them from the physician before giving this patient any medication she is new to their facility. Call back

## 2023-10-10 NOTE — Telephone Encounter (Signed)
 Called [facility] and never received a fax order for this medicine.  Can not give to patient without an order she is in a facility.

## 2023-10-10 NOTE — Telephone Encounter (Signed)
 Fax order to [phone] [facility]

## 2023-10-11 NOTE — Telephone Encounter (Signed)
 faxed

## 2023-10-21 ENCOUNTER — Other Ambulatory Visit: Payer: Self-pay | Admitting: Internal Medicine

## 2023-11-02 ENCOUNTER — Ambulatory Visit: Admitting: Obstetrics & Gynecology

## 2023-11-09 ENCOUNTER — Other Ambulatory Visit: Payer: Self-pay | Admitting: Internal Medicine

## 2023-11-09 DIAGNOSIS — Z1231 Encounter for screening mammogram for malignant neoplasm of breast: Secondary | ICD-10-CM

## 2023-11-21 ENCOUNTER — Ambulatory Visit: Admitting: Internal Medicine

## 2023-11-30 ENCOUNTER — Encounter: Payer: Self-pay | Admitting: Internal Medicine

## 2023-11-30 ENCOUNTER — Encounter: Admitting: Obstetrics & Gynecology

## 2023-12-05 ENCOUNTER — Encounter: Payer: Self-pay | Admitting: Obstetrics & Gynecology

## 2024-01-20 DIAGNOSIS — S0285XD Fracture of orbit, unspecified, subsequent encounter for fracture with routine healing: Secondary | ICD-10-CM | POA: Diagnosis not present

## 2024-01-20 DIAGNOSIS — H2513 Age-related nuclear cataract, bilateral: Secondary | ICD-10-CM | POA: Diagnosis not present

## 2024-01-24 DIAGNOSIS — I10 Essential (primary) hypertension: Secondary | ICD-10-CM | POA: Diagnosis not present

## 2024-01-24 DIAGNOSIS — Z0001 Encounter for general adult medical examination with abnormal findings: Secondary | ICD-10-CM | POA: Diagnosis not present

## 2024-01-24 DIAGNOSIS — E039 Hypothyroidism, unspecified: Secondary | ICD-10-CM | POA: Diagnosis not present

## 2024-02-24 ENCOUNTER — Encounter (HOSPITAL_COMMUNITY): Payer: Self-pay

## 2024-02-24 ENCOUNTER — Ambulatory Visit (HOSPITAL_COMMUNITY)
Admission: RE | Admit: 2024-02-24 | Discharge: 2024-02-24 | Disposition: A | Discharge status: Home / Self Care | Source: Ambulatory Visit | Attending: Internal Medicine | Admitting: Internal Medicine

## 2024-02-24 DIAGNOSIS — Z1231 Encounter for screening mammogram for malignant neoplasm of breast: Secondary | ICD-10-CM | POA: Insufficient documentation

## 2024-03-01 ENCOUNTER — Other Ambulatory Visit (HOSPITAL_COMMUNITY): Payer: Self-pay | Admitting: Internal Medicine

## 2024-03-01 DIAGNOSIS — R928 Other abnormal and inconclusive findings on diagnostic imaging of breast: Secondary | ICD-10-CM

## 2024-03-15 ENCOUNTER — Ambulatory Visit (HOSPITAL_COMMUNITY)
Admission: RE | Admit: 2024-03-15 | Discharge: 2024-03-15 | Disposition: A | Discharge status: Home / Self Care | Source: Ambulatory Visit | Attending: Internal Medicine | Admitting: Internal Medicine

## 2024-03-15 ENCOUNTER — Encounter (HOSPITAL_COMMUNITY): Payer: Self-pay

## 2024-03-15 DIAGNOSIS — R92333 Mammographic heterogeneous density, bilateral breasts: Secondary | ICD-10-CM | POA: Diagnosis not present

## 2024-03-15 DIAGNOSIS — R928 Other abnormal and inconclusive findings on diagnostic imaging of breast: Secondary | ICD-10-CM

## 2024-03-15 DIAGNOSIS — N6315 Unspecified lump in the right breast, overlapping quadrants: Secondary | ICD-10-CM | POA: Diagnosis not present

## 2024-03-15 DIAGNOSIS — N6313 Unspecified lump in the right breast, lower outer quadrant: Secondary | ICD-10-CM | POA: Diagnosis not present

## 2024-03-20 DIAGNOSIS — L853 Xerosis cutis: Secondary | ICD-10-CM | POA: Diagnosis not present

## 2024-03-20 DIAGNOSIS — I10 Essential (primary) hypertension: Secondary | ICD-10-CM | POA: Diagnosis not present

## 2024-03-20 DIAGNOSIS — R2689 Other abnormalities of gait and mobility: Secondary | ICD-10-CM | POA: Diagnosis not present

## 2024-03-20 DIAGNOSIS — E039 Hypothyroidism, unspecified: Secondary | ICD-10-CM | POA: Diagnosis not present

## 2024-04-19 DIAGNOSIS — I10 Essential (primary) hypertension: Secondary | ICD-10-CM | POA: Diagnosis not present

## 2024-04-19 DIAGNOSIS — E039 Hypothyroidism, unspecified: Secondary | ICD-10-CM | POA: Diagnosis not present

## 2024-07-17 ENCOUNTER — Other Ambulatory Visit (HOSPITAL_COMMUNITY): Payer: Self-pay | Admitting: Internal Medicine

## 2024-07-17 DIAGNOSIS — R928 Other abnormal and inconclusive findings on diagnostic imaging of breast: Secondary | ICD-10-CM

## 2024-08-16 ENCOUNTER — Ambulatory Visit: Admitting: Podiatry
